# Patient Record
Sex: Female | Born: 1935
Health system: Southern US, Community
[De-identification: ages and names within clinical notes are randomized; demographics above are authoritative.]

## PROBLEM LIST (undated history)

## (undated) DIAGNOSIS — M1710 Unilateral primary osteoarthritis, unspecified knee: Secondary | ICD-10-CM

## (undated) DIAGNOSIS — K5790 Diverticulosis of intestine, part unspecified, without perforation or abscess without bleeding: Secondary | ICD-10-CM

## (undated) DIAGNOSIS — R079 Chest pain, unspecified: Secondary | ICD-10-CM

## (undated) DIAGNOSIS — K582 Mixed irritable bowel syndrome: Secondary | ICD-10-CM

## (undated) DIAGNOSIS — K219 Gastro-esophageal reflux disease without esophagitis: Secondary | ICD-10-CM

## (undated) DIAGNOSIS — M1990 Unspecified osteoarthritis, unspecified site: Secondary | ICD-10-CM

## (undated) DIAGNOSIS — E785 Hyperlipidemia, unspecified: Secondary | ICD-10-CM

## (undated) DIAGNOSIS — K5730 Diverticulosis of large intestine without perforation or abscess without bleeding: Secondary | ICD-10-CM

## (undated) DIAGNOSIS — I10 Essential (primary) hypertension: Secondary | ICD-10-CM

## (undated) DIAGNOSIS — Z8489 Family history of other specified conditions: Secondary | ICD-10-CM

## (undated) DIAGNOSIS — K589 Irritable bowel syndrome without diarrhea: Secondary | ICD-10-CM

## (undated) DIAGNOSIS — Z8719 Personal history of other diseases of the digestive system: Secondary | ICD-10-CM

## (undated) DIAGNOSIS — IMO0002 Reserved for concepts with insufficient information to code with codable children: Secondary | ICD-10-CM

## (undated) DIAGNOSIS — M545 Low back pain, unspecified: Secondary | ICD-10-CM

## (undated) DIAGNOSIS — I2510 Atherosclerotic heart disease of native coronary artery without angina pectoris: Secondary | ICD-10-CM

## (undated) DIAGNOSIS — R9389 Abnormal findings on diagnostic imaging of other specified body structures: Secondary | ICD-10-CM

## (undated) DIAGNOSIS — M8580 Other specified disorders of bone density and structure, unspecified site: Secondary | ICD-10-CM

## (undated) DIAGNOSIS — I25118 Atherosclerotic heart disease of native coronary artery with other forms of angina pectoris: Secondary | ICD-10-CM

## (undated) DIAGNOSIS — S0300XA Dislocation of jaw, unspecified side, initial encounter: Secondary | ICD-10-CM

## (undated) DIAGNOSIS — Z9861 Coronary angioplasty status: Secondary | ICD-10-CM

## (undated) DIAGNOSIS — G8929 Other chronic pain: Secondary | ICD-10-CM

## (undated) DIAGNOSIS — Z79899 Other long term (current) drug therapy: Secondary | ICD-10-CM

## (undated) DIAGNOSIS — J189 Pneumonia, unspecified organism: Secondary | ICD-10-CM

## (undated) DIAGNOSIS — R609 Edema, unspecified: Secondary | ICD-10-CM

## (undated) DIAGNOSIS — T4145XA Adverse effect of unspecified anesthetic, initial encounter: Secondary | ICD-10-CM

## (undated) HISTORY — DX: Diverticulosis of large intestine without perforation or abscess without bleeding: K57.30

## (undated) HISTORY — DX: Essential (primary) hypertension: I10

## (undated) HISTORY — PX: JOINT REPLACEMENT: SHX530

## (undated) HISTORY — DX: Chest pain, unspecified: R07.9

## (undated) HISTORY — DX: Other long term (current) drug therapy: Z79.899

## (undated) HISTORY — DX: Personal history of other diseases of the digestive system: Z87.19

## (undated) HISTORY — DX: Unilateral primary osteoarthritis, unspecified knee: M17.10

## (undated) HISTORY — DX: Edema, unspecified: R60.9

## (undated) HISTORY — PX: SQUAMOUS CELL CARCINOMA EXCISION: SHX2433

## (undated) HISTORY — PX: OTHER SURGICAL HISTORY: SHX169

## (undated) HISTORY — DX: Irritable bowel syndrome without diarrhea: K58.9

## (undated) HISTORY — PX: BROW LIFT AND BLEPHAROPLASTY: SHX1271

## (undated) HISTORY — DX: Mixed irritable bowel syndrome: K58.2

## (undated) HISTORY — DX: Hyperlipidemia, unspecified: E78.5

## (undated) HISTORY — DX: Atherosclerotic heart disease of native coronary artery with other forms of angina pectoris: I25.118

## (undated) HISTORY — DX: Abnormal findings on diagnostic imaging of other specified body structures: R93.89

---
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
## 1966-07-29 HISTORY — PX: NASAL SINUS SURGERY: SHX719

## 1987-07-30 DIAGNOSIS — T8859XA Other complications of anesthesia, initial encounter: Secondary | ICD-10-CM

## 1987-07-30 HISTORY — DX: Other complications of anesthesia, initial encounter: T88.59XA

## 1987-07-30 HISTORY — PX: ABDOMINAL HYSTERECTOMY: SHX81

## 1994-07-29 HISTORY — PX: KNEE ARTHROSCOPY: SHX127

## 1997-07-29 HISTORY — PX: CHOLECYSTECTOMY: SHX55

## 2011-07-30 HISTORY — PX: COLONOSCOPY: SHX174

## 2013-09-14 ENCOUNTER — Other Ambulatory Visit: Payer: Self-pay | Admitting: Orthopedic Surgery

## 2013-09-14 NOTE — H&P (Signed)
Pamela Dominguez. [name]  DOB: [birth year] Married / Language: English / Race: White Female  Date of Admission:  09-27-2013  Chief Complaint:  Right Knee Pain  History of Present Illness The patient is a 80 year old female who comes in for a preoperative History and Physical. The patient is scheduled for a right total knee arthroplasty to be performed by Dr. Dione Plover. Aluisio, MD at [facility] on 09-27-2013. The patient is a 80 year old female who presents for follow up of their knee. The patient is being followed for their bilateral knee pain and osteoarthritis.  Symptoms reported today include: pain and stiffness. The patient feels that they are doing poorly and report their pain level to be moderate. The following medication has been used for pain control: none (she had to stop the Aleve due to a diverticulitis flare).She was not able to do the therapy that was ordered, due to a surgery. She stated after she had recovered enough from her surgery to begin therapy, we did not have any appointments available. Her right knee is getting progressively worse. She doesn't feel as though it is going to improve. It is limiting what she can and cannot do. She has had injections in the past and not beneficial. She is at a stage now where she is just ready to go ahead and get the knee fixed. They have been treated conservatively in the past for the above stated problem and despite conservative measures, they continue to have progressive pain and severe functional limitations and dysfunction. They have failed non-operative management including home exercise, medications, and injections. It is felt that they would benefit from undergoing total joint replacement. Risks and benefits of the procedure have been discussed with the patient and they elect to proceed with surgery. There are no active contraindications to surgery such as ongoing infection or rapidly progressive neurological  disease.  Allergies Axid *ULCER DRUGS*. Hives, Itching. Penicillins. Hives, Itching. Quinite *Antimalarials**. Hives, Itching.  Problem List/Past Medical Primary osteoarthritis of both knees (715.16) Perimenopausal atrophic vaginitis (627.3) Diverticulitis Of Colon High blood pressure Irritable bowel syndrome Urinary Tract Infection Degenerative Disc Disease Menopause Gastroesophageal Reflux Disease Hyperlipidemia Insomnia Osteopenia   Family History Congestive Heart Failure. mother Hypertension. mother Cerebrovascular Accident. First Degree Relatives. mother Cancer. father   Social History Marital status. married Exercise. Exercises daily; does running / walking and other Current work status. 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 no Drug/Alcohol Rehab (Currently). no Alcohol use. never consumed alcohol Tobacco use. Never smoker. never smoker Children. 2 Drug/Alcohol Rehab (Previously). no Pain Contract. no Tobacco / smoke exposure. no    Medication History Aspirin EC Low Strength (81MG  Tablet DR, Oral) Active. Multivitamins ( Oral) Active. Fish Oil (1200MG  Capsule, Oral) Active. Glucosamine Chondr 1500 Complx ( Oral) Active. PreserVision AREDS ( Oral) Active. Calcium 600 ( Oral) Specific dose unknown - Active. Vitamin B Complex ( Oral) Active. Potassium Chloride CR (20MEQ Tablet ER, Oral) Active. Hydrochlorothiazide (25MG  Tablet, Oral) Active. Cholestyramine (4GM/DOSE Powder, Oral) Active. Omeprazole (20MG  Capsule DR, Oral) Active. Amlodipine Besy-Benazepril HCl (10-40MG  Capsule, Oral) Active.   Past Surgical History Hysterectomy. Date: 33. complete (non-cancerous) Appendectomy. Date: 30. Arthroscopy of Knee. Date: 63. left Tonsillectomy. Date: 28. Sinus Surgery. Date: 21. Straighten Nasal Septum Gallbladder Surgery. Date: 1999. laporoscopic   Review of Systems General:Not  Present- Chills, Fever, Night Sweats, Fatigue, Weight Gain, Weight Loss and Memory Loss. Skin:Not Present- Hives, Itching, Rash, Eczema and Lesions. HEENT:Not Present- Tinnitus, Headache, Double Vision,  Visual Loss, Hearing Loss and Dentures. Respiratory:Not Present- Shortness of breath with exertion, Shortness of breath at rest, Allergies, Coughing up blood and Chronic Cough. Cardiovascular:Not Present- Chest Pain, Racing/skipping heartbeats, Difficulty Breathing Lying Down, Murmur, Swelling and Palpitations. Gastrointestinal:Not Present- Bloody Stool, Heartburn, Abdominal Pain, Vomiting, Nausea, Constipation, Diarrhea, Difficulty Swallowing, Jaundice and Loss of appetitie. Female Genitourinary:Not Present- Blood in Urine, Urinary frequency, Weak urinary stream, Discharge, Flank Pain, Incontinence, Painful Urination, Urgency, Urinary Retention and Urinating at Night. Musculoskeletal:Present- Joint Swelling and Morning Stiffness. Not Present- Muscle Weakness, Muscle Pain, Joint Pain, Back Pain and Spasms. Neurological:Not Present- Tremor, Dizziness, Blackout spells, Paralysis, Difficulty with balance and Weakness. Psychiatric:Not Present- Insomnia.    Vitals Weight: 139 lb Height: 63 in Weight was reported by patient. Height was reported by patient. Body Surface Area: 1.67 m Body Mass Index: 24.62 kg/m Pulse: 68 (Regular) Resp.: 12 (Unlabored) BP: 120/68 (Sitting, Right Arm, Standard)     Physical Exam The physical exam findings are as follows:   General Mental Status - Alert, cooperative and good historian. General Appearance- pleasant. Not in acute distress. Orientation- Oriented X3. Build & Nutrition- Well nourished and Well developed.   Head and Neck Head- normocephalic, atraumatic . Neck Global Assessment- supple. no bruit auscultated on the right and no bruit auscultated on the left.   Eye Vision- Wears corrective lenses. Pupil-  Bilateral- Regular and Round. Motion- Bilateral- EOMI.   Chest and Lung Exam Auscultation: Breath sounds:- clear at anterior chest wall and - clear at posterior chest wall. Adventitious sounds:- No Adventitious sounds.   Cardiovascular Auscultation:Rhythm- Regular rate and rhythm. Heart Sounds- S1 WNL and S2 WNL. Murmurs & Other Heart Sounds:Auscultation of the heart reveals - No Murmurs.   Abdomen Palpation/Percussion:Tenderness- Abdomen is non-tender to palpation. Rigidity (guarding)- Abdomen is soft. Auscultation:Auscultation of the abdomen reveals - Bowel sounds normal.   Female Genitourinary Not done, not pertinent to present illness  Musculoskeletal Her right knee shows no effusion. There is a varus deformity noted. Her range of motion is about 5 to 125 with marked crepitus on range of motion, tenderness medial greater than lateral with no instability.  RADIOGRAPHS: Reviewed her radiographs. Once again she has bone on bone arthritis, medial and patellofemoral.   Assessment & Plan Primary osteoarthritis of both knees (715.16) Impression: Right Knee  Note: Plan is for a Right Total Knee Replacement by Dr. Aluisio.  Plan is to go SNF - [facility] in [place]  PCP - Dr. Greg Grisso - Pending clearance at the time of office H&P. She had an abnormal EKG in Dr. Grisso's office on her preop evaluation and has a nuclear stress test pending at the time of her H&P.  The patient does not have any contraindications and will receive TXA (tranexamic acid) prior to surgery.  Signed electronically by Melton Walls L Saraia Platner, III PA-C  

## 2013-09-15 ENCOUNTER — Encounter (HOSPITAL_COMMUNITY): Payer: Self-pay | Admitting: Pharmacy Technician

## 2013-09-16 ENCOUNTER — Other Ambulatory Visit: Payer: Self-pay | Admitting: Orthopedic Surgery

## 2013-09-21 ENCOUNTER — Inpatient Hospital Stay (HOSPITAL_COMMUNITY): Admission: RE | Admit: 2013-09-21 | Payer: Self-pay | Source: Ambulatory Visit

## 2013-09-21 ENCOUNTER — Other Ambulatory Visit (HOSPITAL_COMMUNITY): Payer: Self-pay | Admitting: Orthopedic Surgery

## 2013-09-21 ENCOUNTER — Other Ambulatory Visit (HOSPITAL_COMMUNITY): Payer: Self-pay | Admitting: Anesthesiology

## 2013-09-21 NOTE — Progress Notes (Signed)
Stress test 09-13-13 dr Otho Perl on chart ekg dr Bea Graff 09-06-13 on chart Medical clearance note dr Bea Graff 09-15-13 on chart

## 2013-09-22 ENCOUNTER — Encounter (HOSPITAL_COMMUNITY)
Admission: RE | Admit: 2013-09-22 | Discharge: 2013-09-22 | Disposition: A | Discharge status: Home / Self Care | Payer: Medicare Other | Source: Ambulatory Visit | Attending: Orthopedic Surgery | Admitting: Orthopedic Surgery

## 2013-09-22 ENCOUNTER — Encounter (HOSPITAL_COMMUNITY): Payer: Self-pay

## 2013-09-22 ENCOUNTER — Ambulatory Visit (HOSPITAL_COMMUNITY)
Admission: RE | Admit: 2013-09-22 | Discharge: 2013-09-22 | Disposition: A | Discharge status: Home / Self Care | Payer: Medicare Other | Source: Ambulatory Visit | Attending: Orthopedic Surgery | Admitting: Orthopedic Surgery

## 2013-09-22 DIAGNOSIS — Z9089 Acquired absence of other organs: Secondary | ICD-10-CM | POA: Insufficient documentation

## 2013-09-22 DIAGNOSIS — Z01812 Encounter for preprocedural laboratory examination: Secondary | ICD-10-CM | POA: Insufficient documentation

## 2013-09-22 DIAGNOSIS — Z01818 Encounter for other preprocedural examination: Secondary | ICD-10-CM | POA: Insufficient documentation

## 2013-09-22 HISTORY — DX: Essential (primary) hypertension: I10

## 2013-09-22 HISTORY — DX: Other specified disorders of bone density and structure, unspecified site: M85.80

## 2013-09-22 HISTORY — DX: Unspecified osteoarthritis, unspecified site: M19.90

## 2013-09-22 HISTORY — DX: Adverse effect of unspecified anesthetic, initial encounter: T41.45XA

## 2013-09-22 HISTORY — DX: Irritable bowel syndrome, unspecified: K58.9

## 2013-09-22 HISTORY — DX: Dislocation of jaw, unspecified side, initial encounter: S03.00XA

## 2013-09-22 HISTORY — DX: Gastro-esophageal reflux disease without esophagitis: K21.9

## 2013-09-22 HISTORY — DX: Low back pain, unspecified: M54.50

## 2013-09-22 HISTORY — DX: Diverticulosis of intestine, part unspecified, without perforation or abscess without bleeding: K57.90

## 2013-09-22 HISTORY — DX: Hyperlipidemia, unspecified: E78.5

## 2013-09-22 HISTORY — DX: Family history of other specified conditions: Z84.89

## 2013-09-22 HISTORY — DX: Other chronic pain: G89.29

## 2013-09-22 HISTORY — DX: Low back pain: M54.5

## 2013-09-22 LAB — CBC
HCT: 40.9 % (ref 36.0–46.0)
Hemoglobin: 13.8 g/dL (ref 12.0–15.0)
MCH: 30 pg (ref 26.0–34.0)
MCHC: 33.7 g/dL (ref 30.0–36.0)
MCV: 88.9 fL (ref 78.0–100.0)
PLATELETS: 196 10*3/uL (ref 150–400)
RBC: 4.6 MIL/uL (ref 3.87–5.11)
RDW: 13 % (ref 11.5–15.5)
WBC: 6.4 10*3/uL (ref 4.0–10.5)

## 2013-09-22 LAB — COMPREHENSIVE METABOLIC PANEL
ALBUMIN: 3.6 g/dL (ref 3.5–5.2)
ALT: 16 U/L (ref 0–35)
AST: 14 U/L (ref 0–37)
Alkaline Phosphatase: 85 U/L (ref 39–117)
BUN: 14 mg/dL (ref 6–23)
CALCIUM: 9.6 mg/dL (ref 8.4–10.5)
CHLORIDE: 100 meq/L (ref 96–112)
CO2: 27 mEq/L (ref 19–32)
CREATININE: 0.43 mg/dL — AB (ref 0.50–1.10)
GFR calc Af Amer: >90 mL/min (ref >90)
Glucose, Bld: 86 mg/dL (ref 70–99)
Potassium: 4 mEq/L (ref 3.7–5.3)
Sodium: 138 mEq/L (ref 137–147)
Total Bilirubin: 0.3 mg/dL (ref 0.3–1.2)
Total Protein: 6.6 g/dL (ref 6.0–8.3)

## 2013-09-22 LAB — SURGICAL PCR SCREEN
MRSA, PCR: NEGATIVE
Staphylococcus aureus: NEGATIVE

## 2013-09-22 LAB — URINALYSIS, ROUTINE W REFLEX MICROSCOPIC
Bilirubin Urine: NEGATIVE
GLUCOSE, UA: NEGATIVE mg/dL
Hgb urine dipstick: NEGATIVE
KETONES UR: NEGATIVE mg/dL
NITRITE: NEGATIVE
Protein, ur: NEGATIVE mg/dL
Specific Gravity, Urine: 1.007 (ref 1.005–1.030)
UROBILINOGEN UA: 0.2 mg/dL (ref 0.0–1.0)
pH: 6 (ref 5.0–8.0)

## 2013-09-22 LAB — APTT: aPTT: 27 seconds (ref 24–37)

## 2013-09-22 LAB — PROTIME-INR
INR: 0.94 (ref 0.00–1.49)
Prothrombin Time: 12.4 seconds (ref 11.6–15.2)

## 2013-09-22 LAB — URINE MICROSCOPIC-ADD ON

## 2013-09-22 LAB — ABO/RH: ABO/RH(D): O NEG

## 2013-09-22 NOTE — Progress Notes (Signed)
Micro ua results faxed to dr aluisio by epic 

## 2013-09-22 NOTE — Patient Instructions (Addendum)
Bruni  09/22/2013   Your procedure is scheduled on: Monday March 2nd.  Report to [place] at 830 AM.  Call this number if you have problems the morning of surgery [phone]   Remember:  Do not eat food or drink liquids :After Midnight.     Take these medicines the morning of surgery with A SIP OF WATER: omeprazole, zyrtec                                SEE [place] PREPARING FOR SURGERY SHEET             You may not have any metal on your body including hair pins and piercings  Do not wear jewelry, make-up.  Do not wear lotions, powders, or perfumes. No  Deodorant is to be worn.   Men may shave face and neck.  Do not bring valuables to the hospital. [place].  Contacts, dentures or bridgework may not be worn into surgery.  Leave suitcase in the car. After surgery it may be brought to your room.  For patients admitted to the hospital, checkout time is 11:00 AM the day of discharge.   Please read over the following fact sheets that you were given: [facility] Preparing for surgery sheet, MRSA information, incentive spirometer sheet, blood fact sheet  Call Zelphia Cairo RN pre op nurse if needed [phone]    [place].  PATIENT SIGNATURE___________________________________________  NURSE SIGNATURE_____________________________________________

## 2013-09-27 ENCOUNTER — Encounter (HOSPITAL_COMMUNITY)
Admission: RE | Disposition: A | Discharge status: Skilled Nursing Facility | Payer: Self-pay | Source: Ambulatory Visit | Attending: Orthopedic Surgery

## 2013-09-27 ENCOUNTER — Inpatient Hospital Stay (HOSPITAL_COMMUNITY)
Admission: RE | Admit: 2013-09-27 | Discharge: 2013-09-29 | DRG: 470 | Disposition: A | Discharge status: Skilled Nursing Facility | Payer: Medicare Other | Source: Ambulatory Visit | Attending: Orthopedic Surgery | Admitting: Orthopedic Surgery

## 2013-09-27 ENCOUNTER — Encounter (HOSPITAL_COMMUNITY): Payer: Medicare Other | Admitting: Anesthesiology

## 2013-09-27 ENCOUNTER — Encounter (HOSPITAL_COMMUNITY): Payer: Self-pay | Admitting: *Deleted

## 2013-09-27 ENCOUNTER — Inpatient Hospital Stay (HOSPITAL_COMMUNITY): Payer: Medicare Other | Admitting: Anesthesiology

## 2013-09-27 DIAGNOSIS — M179 Osteoarthritis of knee, unspecified: Secondary | ICD-10-CM

## 2013-09-27 DIAGNOSIS — M899 Disorder of bone, unspecified: Secondary | ICD-10-CM | POA: Diagnosis present

## 2013-09-27 DIAGNOSIS — Z79899 Other long term (current) drug therapy: Secondary | ICD-10-CM

## 2013-09-27 DIAGNOSIS — E785 Hyperlipidemia, unspecified: Secondary | ICD-10-CM

## 2013-09-27 DIAGNOSIS — Z8249 Family history of ischemic heart disease and other diseases of the circulatory system: Secondary | ICD-10-CM

## 2013-09-27 DIAGNOSIS — K219 Gastro-esophageal reflux disease without esophagitis: Secondary | ICD-10-CM

## 2013-09-27 DIAGNOSIS — M1710 Unilateral primary osteoarthritis, unspecified knee: Principal | ICD-10-CM | POA: Diagnosis present

## 2013-09-27 DIAGNOSIS — M545 Low back pain, unspecified: Secondary | ICD-10-CM | POA: Diagnosis present

## 2013-09-27 DIAGNOSIS — K589 Irritable bowel syndrome without diarrhea: Secondary | ICD-10-CM

## 2013-09-27 DIAGNOSIS — Z887 Allergy status to serum and vaccine status: Secondary | ICD-10-CM

## 2013-09-27 DIAGNOSIS — G8929 Other chronic pain: Secondary | ICD-10-CM | POA: Diagnosis present

## 2013-09-27 DIAGNOSIS — Z880 Allergy status to penicillin: Secondary | ICD-10-CM

## 2013-09-27 DIAGNOSIS — Z7982 Long term (current) use of aspirin: Secondary | ICD-10-CM

## 2013-09-27 DIAGNOSIS — Z96651 Presence of right artificial knee joint: Secondary | ICD-10-CM

## 2013-09-27 DIAGNOSIS — I10 Essential (primary) hypertension: Secondary | ICD-10-CM

## 2013-09-27 DIAGNOSIS — M898X9 Other specified disorders of bone, unspecified site: Secondary | ICD-10-CM | POA: Diagnosis present

## 2013-09-27 DIAGNOSIS — M949 Disorder of cartilage, unspecified: Secondary | ICD-10-CM

## 2013-09-27 DIAGNOSIS — Z888 Allergy status to other drugs, medicaments and biological substances status: Secondary | ICD-10-CM

## 2013-09-27 DIAGNOSIS — K5730 Diverticulosis of large intestine without perforation or abscess without bleeding: Secondary | ICD-10-CM

## 2013-09-27 HISTORY — DX: Unilateral primary osteoarthritis, unspecified knee: M17.10

## 2013-09-27 HISTORY — DX: Osteoarthritis of knee, unspecified: M17.9

## 2013-09-27 HISTORY — PX: TOTAL KNEE ARTHROPLASTY: SHX125

## 2013-09-27 LAB — TYPE AND SCREEN
ABO/RH(D): O NEG
ANTIBODY SCREEN: NEGATIVE

## 2013-09-27 SURGERY — ARTHROPLASTY, KNEE, TOTAL
Anesthesia: Spinal | Site: Knee | Laterality: Right

## 2013-09-27 MED ORDER — DEXAMETHASONE SODIUM PHOSPHATE 10 MG/ML IJ SOLN: 10.0000 mg | Freq: Once | INTRAMUSCULAR | Status: DC

## 2013-09-27 MED ORDER — METHOCARBAMOL 100 MG/ML IJ SOLN
500.0000 mg | Freq: Four times a day (QID) | INTRAVENOUS | Status: DC | PRN
  Administered 2013-09-27: 500 mg via INTRAVENOUS
  Filled 2013-09-27: qty 5

## 2013-09-27 MED ORDER — CHOLESTYRAMINE 4 G PO PACK
4.0000 g | PACK | Freq: Every day | ORAL | Status: DC
  Filled 2013-09-27 (×3): qty 1

## 2013-09-27 MED ORDER — PROMETHAZINE HCL 25 MG/ML IJ SOLN: 6.2500 mg | INTRAMUSCULAR | Status: DC | PRN

## 2013-09-27 MED ORDER — LACTATED RINGERS IV SOLN: INTRAVENOUS | Status: DC

## 2013-09-27 MED ORDER — FLEET ENEMA 7-19 GM/118ML RE ENEM: 1.0000 | ENEMA | Freq: Once | RECTAL | Status: AC | PRN

## 2013-09-27 MED ORDER — HYDROCHLOROTHIAZIDE 25 MG PO TABS
25.0000 mg | ORAL_TABLET | Freq: Every morning | ORAL | Status: DC
  Administered 2013-09-28 – 2013-09-29 (×2): 25 mg via ORAL
  Filled 2013-09-27 (×2): qty 1

## 2013-09-27 MED ORDER — MENTHOL 3 MG MT LOZG
1.0000 | LOZENGE | OROMUCOSAL | Status: DC | PRN
  Filled 2013-09-27: qty 9

## 2013-09-27 MED ORDER — PHENYLEPHRINE HCL 10 MG/ML IJ SOLN
INTRAMUSCULAR | Status: DC | PRN
  Administered 2013-09-27 (×4): 40 ug via INTRAVENOUS

## 2013-09-27 MED ORDER — OXYCODONE HCL 5 MG PO TABS
5.0000 mg | ORAL_TABLET | ORAL | Status: DC | PRN
  Administered 2013-09-27 – 2013-09-28 (×3): 10 mg via ORAL
  Administered 2013-09-28 (×2): 5 mg via ORAL
  Administered 2013-09-28: 10 mg via ORAL
  Administered 2013-09-28: 5 mg via ORAL
  Administered 2013-09-29 (×4): 10 mg via ORAL
  Filled 2013-09-27 (×6): qty 2
  Filled 2013-09-27 (×3): qty 1
  Filled 2013-09-27 (×2): qty 2

## 2013-09-27 MED ORDER — BUPIVACAINE LIPOSOME 1.3 % IJ SUSP
INTRAMUSCULAR | Status: DC | PRN
  Administered 2013-09-27: 20 mL

## 2013-09-27 MED ORDER — MIDAZOLAM HCL 2 MG/2ML IJ SOLN
INTRAMUSCULAR | Status: AC
  Filled 2013-09-27: qty 2

## 2013-09-27 MED ORDER — ONDANSETRON HCL 4 MG/2ML IJ SOLN: 4.0000 mg | Freq: Four times a day (QID) | INTRAMUSCULAR | Status: DC | PRN

## 2013-09-27 MED ORDER — MORPHINE SULFATE 2 MG/ML IJ SOLN
1.0000 mg | INTRAMUSCULAR | Status: DC | PRN
  Administered 2013-09-28: 1 mg via INTRAVENOUS
  Filled 2013-09-27: qty 1

## 2013-09-27 MED ORDER — LORATADINE 10 MG PO TABS
10.0000 mg | ORAL_TABLET | Freq: Every day | ORAL | Status: DC
Start: 2013-09-28 — End: 2013-09-29
  Administered 2013-09-28 – 2013-09-29 (×2): 10 mg via ORAL
  Filled 2013-09-27 (×2): qty 1

## 2013-09-27 MED ORDER — METHOCARBAMOL 500 MG PO TABS
500.0000 mg | ORAL_TABLET | Freq: Four times a day (QID) | ORAL | Status: DC | PRN
  Administered 2013-09-27 – 2013-09-29 (×4): 500 mg via ORAL
  Filled 2013-09-27 (×4): qty 1

## 2013-09-27 MED ORDER — KCL IN DEXTROSE-NACL 20-5-0.9 MEQ/L-%-% IV SOLN
INTRAVENOUS | Status: AC
  Filled 2013-09-27: qty 1000

## 2013-09-27 MED ORDER — METOCLOPRAMIDE HCL 5 MG/ML IJ SOLN: 5.0000 mg | Freq: Three times a day (TID) | INTRAMUSCULAR | Status: DC | PRN

## 2013-09-27 MED ORDER — ACETAMINOPHEN 500 MG PO TABS
1000.0000 mg | ORAL_TABLET | Freq: Four times a day (QID) | ORAL | Status: AC
  Administered 2013-09-27 – 2013-09-28 (×4): 1000 mg via ORAL
  Filled 2013-09-27 (×4): qty 2

## 2013-09-27 MED ORDER — SODIUM CHLORIDE 0.9 % IR SOLN
Status: DC | PRN
  Administered 2013-09-27: 1000 mL

## 2013-09-27 MED ORDER — POTASSIUM CHLORIDE CRYS ER 20 MEQ PO TBCR
20.0000 meq | EXTENDED_RELEASE_TABLET | Freq: Every day | ORAL | Status: DC
  Administered 2013-09-27 – 2013-09-29 (×3): 20 meq via ORAL
  Filled 2013-09-27 (×3): qty 1

## 2013-09-27 MED ORDER — TRAMADOL HCL 50 MG PO TABS: 50.0000 mg | ORAL_TABLET | Freq: Four times a day (QID) | ORAL | Status: DC | PRN

## 2013-09-27 MED ORDER — KETOROLAC TROMETHAMINE 15 MG/ML IJ SOLN
7.5000 mg | Freq: Four times a day (QID) | INTRAMUSCULAR | Status: AC | PRN
  Filled 2013-09-27: qty 1

## 2013-09-27 MED ORDER — METOCLOPRAMIDE HCL 10 MG PO TABS: 5.0000 mg | ORAL_TABLET | Freq: Three times a day (TID) | ORAL | Status: DC | PRN

## 2013-09-27 MED ORDER — BISACODYL 10 MG RE SUPP
10.0000 mg | Freq: Every day | RECTAL | Status: DC | PRN
  Administered 2013-09-29: 10 mg via RECTAL
  Filled 2013-09-27: qty 1

## 2013-09-27 MED ORDER — PROPOFOL 10 MG/ML IV BOLUS
INTRAVENOUS | Status: AC
  Filled 2013-09-27: qty 20

## 2013-09-27 MED ORDER — PANTOPRAZOLE SODIUM 40 MG PO TBEC
40.0000 mg | DELAYED_RELEASE_TABLET | Freq: Every day | ORAL | Status: DC
Start: 2013-09-28 — End: 2013-09-28
  Administered 2013-09-28: 40 mg via ORAL
  Filled 2013-09-27 (×2): qty 1

## 2013-09-27 MED ORDER — BUPIVACAINE LIPOSOME 1.3 % IJ SUSP
20.0000 mL | Freq: Once | INTRAMUSCULAR | Status: DC
  Filled 2013-09-27: qty 20

## 2013-09-27 MED ORDER — ACETAMINOPHEN 325 MG PO TABS: 650.0000 mg | ORAL_TABLET | Freq: Four times a day (QID) | ORAL | Status: DC | PRN

## 2013-09-27 MED ORDER — VANCOMYCIN HCL IN DEXTROSE 1-5 GM/200ML-% IV SOLN
INTRAVENOUS | Status: AC
  Filled 2013-09-27: qty 200

## 2013-09-27 MED ORDER — POLYETHYLENE GLYCOL 3350 17 G PO PACK: 17.0000 g | PACK | Freq: Every day | ORAL | Status: DC | PRN

## 2013-09-27 MED ORDER — 0.9 % SODIUM CHLORIDE (POUR BTL) OPTIME
TOPICAL | Status: DC | PRN
  Administered 2013-09-27: 1000 mL

## 2013-09-27 MED ORDER — RIVAROXABAN 10 MG PO TABS
10.0000 mg | ORAL_TABLET | Freq: Every day | ORAL | Status: DC
  Administered 2013-09-28 – 2013-09-29 (×2): 10 mg via ORAL
  Filled 2013-09-27 (×3): qty 1

## 2013-09-27 MED ORDER — AMLODIPINE BESYLATE 10 MG PO TABS
10.0000 mg | ORAL_TABLET | Freq: Once | ORAL | Status: AC
  Administered 2013-09-27: 10 mg via ORAL
  Filled 2013-09-27: qty 1

## 2013-09-27 MED ORDER — BUPIVACAINE HCL (PF) 0.25 % IJ SOLN
INTRAMUSCULAR | Status: AC
  Filled 2013-09-27: qty 30

## 2013-09-27 MED ORDER — KCL IN DEXTROSE-NACL 20-5-0.9 MEQ/L-%-% IV SOLN
INTRAVENOUS | Status: DC
  Administered 2013-09-27: 15:00:00 via INTRAVENOUS
  Filled 2013-09-27 (×4): qty 1000

## 2013-09-27 MED ORDER — FENTANYL CITRATE 0.05 MG/ML IJ SOLN
INTRAMUSCULAR | Status: DC | PRN
  Administered 2013-09-27: 50 ug via INTRAVENOUS

## 2013-09-27 MED ORDER — SODIUM CHLORIDE 0.9 % IV SOLN: INTRAVENOUS | Status: DC

## 2013-09-27 MED ORDER — PROPOFOL 10 MG/ML IV BOLUS
INTRAVENOUS | Status: AC
Start: 2013-09-27 — End: 2013-09-27
  Filled 2013-09-27: qty 20

## 2013-09-27 MED ORDER — MIDAZOLAM HCL 5 MG/5ML IJ SOLN
INTRAMUSCULAR | Status: DC | PRN
  Administered 2013-09-27: 2 mg via INTRAVENOUS

## 2013-09-27 MED ORDER — DOCUSATE SODIUM 100 MG PO CAPS
100.0000 mg | ORAL_CAPSULE | Freq: Two times a day (BID) | ORAL | Status: DC
  Administered 2013-09-27 – 2013-09-29 (×4): 100 mg via ORAL

## 2013-09-27 MED ORDER — SODIUM CHLORIDE 0.9 % IJ SOLN
INTRAMUSCULAR | Status: AC
  Filled 2013-09-27: qty 50

## 2013-09-27 MED ORDER — HYDROMORPHONE HCL PF 1 MG/ML IJ SOLN: 0.2500 mg | INTRAMUSCULAR | Status: DC | PRN

## 2013-09-27 MED ORDER — ONDANSETRON HCL 4 MG PO TABS: 4.0000 mg | ORAL_TABLET | Freq: Four times a day (QID) | ORAL | Status: DC | PRN

## 2013-09-27 MED ORDER — LACTATED RINGERS IV SOLN
INTRAVENOUS | Status: DC | PRN
  Administered 2013-09-27 (×2): via INTRAVENOUS

## 2013-09-27 MED ORDER — FENTANYL CITRATE 0.05 MG/ML IJ SOLN
INTRAMUSCULAR | Status: AC
  Filled 2013-09-27: qty 2

## 2013-09-27 MED ORDER — ACETAMINOPHEN 650 MG RE SUPP: 650.0000 mg | Freq: Four times a day (QID) | RECTAL | Status: DC | PRN

## 2013-09-27 MED ORDER — PHENYLEPHRINE 40 MCG/ML (10ML) SYRINGE FOR IV PUSH (FOR BLOOD PRESSURE SUPPORT)
PREFILLED_SYRINGE | INTRAVENOUS | Status: AC
  Filled 2013-09-27: qty 10

## 2013-09-27 MED ORDER — ACETAMINOPHEN 10 MG/ML IV SOLN
INTRAVENOUS | Status: DC | PRN
  Administered 2013-09-27: 1000 mg via INTRAVENOUS

## 2013-09-27 MED ORDER — DEXAMETHASONE SODIUM PHOSPHATE 10 MG/ML IJ SOLN
INTRAMUSCULAR | Status: DC | PRN
  Administered 2013-09-27: 10 mg via INTRAVENOUS

## 2013-09-27 MED ORDER — DEXAMETHASONE SODIUM PHOSPHATE 10 MG/ML IJ SOLN
10.0000 mg | Freq: Every day | INTRAMUSCULAR | Status: AC
  Filled 2013-09-27: qty 1

## 2013-09-27 MED ORDER — TRANEXAMIC ACID 100 MG/ML IV SOLN
1000.0000 mg | INTRAVENOUS | Status: AC
  Administered 2013-09-27: 1000 mg via INTRAVENOUS
  Filled 2013-09-27: qty 10

## 2013-09-27 MED ORDER — LACTATED RINGERS IV SOLN
INTRAVENOUS | Status: DC
  Administered 2013-09-27: 1000 mL via INTRAVENOUS

## 2013-09-27 MED ORDER — BUPIVACAINE HCL 0.25 % IJ SOLN
INTRAMUSCULAR | Status: DC | PRN
  Administered 2013-09-27: 20 mL

## 2013-09-27 MED ORDER — PROPOFOL 10 MG/ML IV BOLUS
INTRAVENOUS | Status: DC | PRN
  Administered 2013-09-27: 20 mg via INTRAVENOUS

## 2013-09-27 MED ORDER — PHENOL 1.4 % MT LIQD
1.0000 | OROMUCOSAL | Status: DC | PRN
  Filled 2013-09-27: qty 177

## 2013-09-27 MED ORDER — DIPHENHYDRAMINE HCL 12.5 MG/5ML PO ELIX: 12.5000 mg | ORAL_SOLUTION | ORAL | Status: DC | PRN

## 2013-09-27 MED ORDER — KETAMINE HCL 50 MG/ML IJ SOLN
INTRAMUSCULAR | Status: DC | PRN
  Administered 2013-09-27: 1.7 mg via INTRAMUSCULAR

## 2013-09-27 MED ORDER — STERILE WATER FOR IRRIGATION IR SOLN
Status: DC | PRN
  Administered 2013-09-27: 3000 mL

## 2013-09-27 MED ORDER — VANCOMYCIN HCL IN DEXTROSE 1-5 GM/200ML-% IV SOLN
1000.0000 mg | INTRAVENOUS | Status: AC
  Administered 2013-09-27: 1000 mg via INTRAVENOUS

## 2013-09-27 MED ORDER — VANCOMYCIN HCL IN DEXTROSE 1-5 GM/200ML-% IV SOLN
1000.0000 mg | Freq: Two times a day (BID) | INTRAVENOUS | Status: AC
  Administered 2013-09-27: 1000 mg via INTRAVENOUS
  Filled 2013-09-27: qty 200

## 2013-09-27 MED ORDER — SODIUM CHLORIDE 0.9 % IJ SOLN
INTRAMUSCULAR | Status: DC | PRN
  Administered 2013-09-27: 30 mL via INTRAVENOUS

## 2013-09-27 MED ORDER — ACETAMINOPHEN 10 MG/ML IV SOLN
1000.0000 mg | Freq: Once | INTRAVENOUS | Status: DC
  Filled 2013-09-27: qty 100

## 2013-09-27 MED ORDER — PROPOFOL INFUSION 10 MG/ML OPTIME
INTRAVENOUS | Status: DC | PRN
  Administered 2013-09-27: 50 ug/kg/min via INTRAVENOUS

## 2013-09-27 MED ORDER — DEXAMETHASONE 6 MG PO TABS
10.0000 mg | ORAL_TABLET | Freq: Every day | ORAL | Status: AC
  Administered 2013-09-28: 10 mg via ORAL
  Filled 2013-09-27: qty 1

## 2013-09-27 SURGICAL SUPPLY — 54 items
BAG ZIPLOCK 12X15 (MISCELLANEOUS) ×3 IMPLANT
BANDAGE ELASTIC 6 VELCRO ST LF (GAUZE/BANDAGES/DRESSINGS) ×3 IMPLANT
BANDAGE ESMARK 6X9 LF (GAUZE/BANDAGES/DRESSINGS) ×1 IMPLANT
BLADE SAG 18X100X1.27 (BLADE) ×3 IMPLANT
BLADE SAW SGTL 11.0X1.19X90.0M (BLADE) ×3 IMPLANT
BNDG ESMARK 6X9 LF (GAUZE/BANDAGES/DRESSINGS) ×3
BOWL SMART MIX CTS (DISPOSABLE) ×3 IMPLANT
CAPT RP KNEE ×3 IMPLANT
CEMENT HV SMART SET (Cement) ×6 IMPLANT
CLOSURE WOUND 1/2 X4 (GAUZE/BANDAGES/DRESSINGS) ×2
CUFF TOURN SGL QUICK 34 (TOURNIQUET CUFF) ×2
CUFF TRNQT CYL 34X4X40X1 (TOURNIQUET CUFF) ×1 IMPLANT
DECANTER SPIKE VIAL GLASS SM (MISCELLANEOUS) ×3 IMPLANT
DRAPE EXTREMITY T 121X128X90 (DRAPE) ×3 IMPLANT
DRAPE POUCH INSTRU U-SHP 10X18 (DRAPES) ×3 IMPLANT
DRAPE U-SHAPE 47X51 STRL (DRAPES) ×3 IMPLANT
DRSG ADAPTIC 3X8 NADH LF (GAUZE/BANDAGES/DRESSINGS) ×3 IMPLANT
DURAPREP 26ML APPLICATOR (WOUND CARE) ×3 IMPLANT
ELECT REM PT RETURN 9FT ADLT (ELECTROSURGICAL) ×3
ELECTRODE REM PT RTRN 9FT ADLT (ELECTROSURGICAL) ×1 IMPLANT
EVACUATOR 1/8 PVC DRAIN (DRAIN) ×3 IMPLANT
FACESHIELD LNG OPTICON STERILE (SAFETY) ×15 IMPLANT
GLOVE BIO SURGEON STRL SZ7.5 (GLOVE) IMPLANT
GLOVE BIO SURGEON STRL SZ8 (GLOVE) ×3 IMPLANT
GLOVE BIOGEL PI IND STRL 8 (GLOVE) ×2 IMPLANT
GLOVE BIOGEL PI INDICATOR 8 (GLOVE) ×4
GLOVE SURG SS PI 6.5 STRL IVOR (GLOVE) IMPLANT
GOWN STRL REUS W/TWL LRG LVL3 (GOWN DISPOSABLE) ×3 IMPLANT
GOWN STRL REUS W/TWL XL LVL3 (GOWN DISPOSABLE) IMPLANT
HANDPIECE INTERPULSE COAX TIP (DISPOSABLE) ×2
IMMOBILIZER KNEE 20 (SOFTGOODS) ×3 IMPLANT
KIT BASIN OR (CUSTOM PROCEDURE TRAY) ×3 IMPLANT
MANIFOLD NEPTUNE II (INSTRUMENTS) ×3 IMPLANT
NDL SAFETY ECLIPSE 18X1.5 (NEEDLE) ×2 IMPLANT
NEEDLE HYPO 18GX1.5 SHARP (NEEDLE) ×4
NS IRRIG 1000ML POUR BTL (IV SOLUTION) ×3 IMPLANT
PACK TOTAL JOINT (CUSTOM PROCEDURE TRAY) ×3 IMPLANT
PAD ABD 8X10 STRL (GAUZE/BANDAGES/DRESSINGS) ×3 IMPLANT
PADDING CAST COTTON 6X4 STRL (CAST SUPPLIES) ×6 IMPLANT
POSITIONER SURGICAL ARM (MISCELLANEOUS) ×3 IMPLANT
SET HNDPC FAN SPRY TIP SCT (DISPOSABLE) ×1 IMPLANT
SPONGE GAUZE 4X4 12PLY (GAUZE/BANDAGES/DRESSINGS) ×3 IMPLANT
STRIP CLOSURE SKIN 1/2X4 (GAUZE/BANDAGES/DRESSINGS) ×4 IMPLANT
SUCTION FRAZIER 12FR DISP (SUCTIONS) ×3 IMPLANT
SUT MNCRL AB 4-0 PS2 18 (SUTURE) ×3 IMPLANT
SUT VIC AB 2-0 CT1 27 (SUTURE) ×6
SUT VIC AB 2-0 CT1 TAPERPNT 27 (SUTURE) ×3 IMPLANT
SUT VLOC 180 0 24IN GS25 (SUTURE) ×3 IMPLANT
SYR 20CC LL (SYRINGE) ×3 IMPLANT
SYR 50ML LL SCALE MARK (SYRINGE) ×3 IMPLANT
TOWEL OR 17X26 10 PK STRL BLUE (TOWEL DISPOSABLE) ×6 IMPLANT
TRAY FOLEY CATH 14FRSI W/METER (CATHETERS) ×3 IMPLANT
WATER STERILE IRR 1500ML POUR (IV SOLUTION) ×3 IMPLANT
WRAP KNEE MAXI GEL POST OP (GAUZE/BANDAGES/DRESSINGS) ×3 IMPLANT

## 2013-09-27 NOTE — H&P (View-Only) (Signed)
Pamela Dominguez. Pamela Dominguez  DOB: [birth year] Married / Language: English / Race: White Female  Date of Admission:  09-27-2013  Chief Complaint:  Right Knee Pain  History of Present Illness The patient is a 80 year old female who comes in for a preoperative History and Physical. The patient is scheduled for a right total knee arthroplasty to be performed by Dr. Dione Plover. Aluisio, MD at [facility] on 09-27-2013. The patient is a 80 year old female who presents for follow up of their knee. The patient is being followed for their bilateral knee pain and osteoarthritis.  Symptoms reported today include: pain and stiffness. The patient feels that they are doing poorly and report their pain level to be moderate. The following medication has been used for pain control: none (she had to stop the Aleve due to a diverticulitis flare).She was not able to do the therapy that was ordered, due to a surgery. She stated after she had recovered enough from her surgery to begin therapy, we did not have any appointments available. Her right knee is getting progressively worse. She doesn't feel as though it is going to improve. It is limiting what she can and cannot do. She has had injections in the past and not beneficial. She is at a stage now where she is just ready to go ahead and get the knee fixed. They have been treated conservatively in the past for the above stated problem and despite conservative measures, they continue to have progressive pain and severe functional limitations and dysfunction. They have failed non-operative management including home exercise, medications, and injections. It is felt that they would benefit from undergoing total joint replacement. Risks and benefits of the procedure have been discussed with the patient and they elect to proceed with surgery. There are no active contraindications to surgery such as ongoing infection or rapidly progressive neurological  disease.  Allergies Axid *ULCER DRUGS*. Hives, Itching. Penicillins. Hives, Itching. Quinite *Antimalarials**. Hives, Itching.  Problem List/Past Medical Primary osteoarthritis of both knees (715.16) Perimenopausal atrophic vaginitis (627.3) Diverticulitis Of Colon High blood pressure Irritable bowel syndrome Urinary Tract Infection Degenerative Disc Disease Menopause Gastroesophageal Reflux Disease Hyperlipidemia Insomnia Osteopenia   Family History Congestive Heart Failure. mother Hypertension. mother Cerebrovascular Accident. First Degree Relatives. mother Cancer. father   Social History Marital status. married Exercise. Exercises daily; does running / walking and other Current work status. 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 no Drug/Alcohol Rehab (Currently). no Alcohol use. never consumed alcohol Tobacco use. Never smoker. never smoker Children. 2 Drug/Alcohol Rehab (Previously). no Pain Contract. no Tobacco / smoke exposure. no    Medication History Aspirin EC Low Strength (81MG  Tablet DR, Oral) Active. Multivitamins ( Oral) Active. Fish Oil (1200MG  Capsule, Oral) Active. Glucosamine Chondr 1500 Complx ( Oral) Active. PreserVision AREDS ( Oral) Active. Calcium 600 ( Oral) Specific dose unknown - Active. Vitamin B Complex ( Oral) Active. Potassium Chloride CR (20MEQ Tablet ER, Oral) Active. Hydrochlorothiazide (25MG  Tablet, Oral) Active. Cholestyramine (4GM/DOSE Powder, Oral) Active. Omeprazole (20MG  Capsule DR, Oral) Active. Amlodipine Besy-Benazepril HCl (10-40MG  Capsule, Oral) Active.   Past Surgical History Hysterectomy. Date: 33. complete (non-cancerous) Appendectomy. Date: 30. Arthroscopy of Knee. Date: 63. left Tonsillectomy. Date: 28. Sinus Surgery. Date: 21. Straighten Nasal Septum Gallbladder Surgery. Date: 1999. laporoscopic   Review of Systems General:Not  Present- Chills, Fever, Night Sweats, Fatigue, Weight Gain, Weight Loss and Memory Loss. Skin:Not Present- Hives, Itching, Rash, Eczema and Lesions. HEENT:Not Present- Tinnitus, Headache, Double Vision,  Visual Loss, Hearing Loss and Dentures. Respiratory:Not Present- Shortness of breath with exertion, Shortness of breath at rest, Allergies, Coughing up blood and Chronic Cough. Cardiovascular:Not Present- Chest Pain, Racing/skipping heartbeats, Difficulty Breathing Lying Down, Murmur, Swelling and Palpitations. Gastrointestinal:Not Present- Bloody Stool, Heartburn, Abdominal Pain, Vomiting, Nausea, Constipation, Diarrhea, Difficulty Swallowing, Jaundice and Loss of appetitie. Female Genitourinary:Not Present- Blood in Urine, Urinary frequency, Weak urinary stream, Discharge, Flank Pain, Incontinence, Painful Urination, Urgency, Urinary Retention and Urinating at Night. Musculoskeletal:Present- Joint Swelling and Morning Stiffness. Not Present- Muscle Weakness, Muscle Pain, Joint Pain, Back Pain and Spasms. Neurological:Not Present- Tremor, Dizziness, Blackout spells, Paralysis, Difficulty with balance and Weakness. Psychiatric:Not Present- Insomnia.    Vitals Weight: 139 lb Height: 63 in Weight was reported by patient. Height was reported by patient. Body Surface Area: 1.67 m Body Mass Index: 24.62 kg/m Pulse: 68 (Regular) Resp.: 12 (Unlabored) BP: 120/68 (Sitting, Right Arm, Standard)     Physical Exam The physical exam findings are as follows:   General Mental Status - Alert, cooperative and good historian. General Appearance- pleasant. Not in acute distress. Orientation- Oriented X3. Build & Nutrition- Well nourished and Well developed.   Head and Neck Head- normocephalic, atraumatic . Neck Global Assessment- supple. no bruit auscultated on the right and no bruit auscultated on the left.   Eye Vision- Wears corrective lenses. Pupil-  Bilateral- Regular and Round. Motion- Bilateral- EOMI.   Chest and Lung Exam Auscultation: Breath sounds:- clear at anterior chest wall and - clear at posterior chest wall. Adventitious sounds:- No Adventitious sounds.   Cardiovascular Auscultation:Rhythm- Regular rate and rhythm. Heart Sounds- S1 WNL and S2 WNL. Murmurs & Other Heart Sounds:Auscultation of the heart reveals - No Murmurs.   Abdomen Palpation/Percussion:Tenderness- Abdomen is non-tender to palpation. Rigidity (guarding)- Abdomen is soft. Auscultation:Auscultation of the abdomen reveals - Bowel sounds normal.   Female Genitourinary Not done, not pertinent to present illness  Musculoskeletal Her right knee shows no effusion. There is a varus deformity noted. Her range of motion is about 5 to 125 with marked crepitus on range of motion, tenderness medial greater than lateral with no instability.  RADIOGRAPHS: Reviewed her radiographs. Once again she has bone on bone arthritis, medial and patellofemoral.   Assessment & Plan Primary osteoarthritis of both knees (715.16) Impression: Right Knee  Note: Plan is for a Right Total Knee Replacement by Dr. Wynelle Link.  Plan is to go SNF - [facility] in [place]  PCP - Dr. Gilford Rile - Pending clearance at the time of office H&P. She had an abnormal EKG in Dr. Willette Pa office on her preop evaluation and has a nuclear stress test pending at the time of her H&P.  The patient does not have any contraindications and will receive TXA (tranexamic acid) prior to surgery.  Signed electronically by Joelene Millin, III PA-C

## 2013-09-27 NOTE — Op Note (Signed)
Pre-operative diagnosis- Osteoarthritis  Right knee(s)  Post-operative diagnosis- Osteoarthritis Right knee(s)  Procedure-  Right  Total Knee Arthroplasty  Surgeon- Dione Plover. Ravin Bendall, MD  Assistant- Ardeen Jourdain, PA-C   Anesthesia-  Spinal EBL-* No blood loss amount entered *  Drains Hemovac  Tourniquet time-  Total Tourniquet Time Documented: Thigh (Right) - 29 minutes Total: Thigh (Right) - 29 minutes    Complications- None  Condition-PACU - hemodynamically stable.   Brief Clinical Note  Pamela Dominguez is a 80 y.o. year old female with end stage OA of her right knee with progressively worsening pain and dysfunction. She has constant pain, with activity and at rest and significant functional deficits with difficulties even with ADLs. She has had extensive non-op management including analgesics, injections of cortisone and viscosupplements, and home exercise program, but remains in significant pain with significant dysfunction.Radiographs show bone on bone arthritis medial and patellofemoral. She presents now for right Total Knee Arthroplasty.    Procedure in detail---   The patient is brought into the operating room and positioned supine on the operating table. After successful administration of  Spinal,   a tourniquet is placed high on the  Right thigh(s) and the lower extremity is prepped and draped in the usual sterile fashion. Time out is performed by the operating team and then the  Right lower extremity is wrapped in Esmarch, knee flexed and the tourniquet inflated to 300 mmHg.       A midline incision is made with a ten blade through the subcutaneous tissue to the level of the extensor mechanism. A fresh blade is used to make a medial parapatellar arthrotomy. Soft tissue over the proximal medial tibia is subperiosteally elevated to the joint line with a knife and into the semimembranosus bursa with a Cobb elevator. Soft tissue over the proximal lateral tibia is elevated with  attention being paid to avoiding the patellar tendon on the tibial tubercle. The patella is everted, knee flexed 90 degrees and the ACL and PCL are removed. Findings are bone on bone medial and patellofemoral with large global osteophytes.        The drill is used to create a starting hole in the distal femur and the canal is thoroughly irrigated with sterile saline to remove the fatty contents. The 5 degree Right  valgus alignment guide is placed into the femoral canal and the distal femoral cutting block is pinned to remove 10 mm off the distal femur. Resection is made with an oscillating saw.      The tibia is subluxed forward and the menisci are removed. The extramedullary alignment guide is placed referencing proximally at the medial aspect of the tibial tubercle and distally along the second metatarsal axis and tibial crest. The block is pinned to remove 12mm off the more deficient medial  side. Resection is made with an oscillating saw. Size 2.5is the most appropriate size for the tibia and the proximal tibia is prepared with the modular drill and keel punch for that size.      The femoral sizing guide is placed and size 2.5 is most appropriate. Rotation is marked off the epicondylar axis and confirmed by creating a rectangular flexion gap at 90 degrees. The size 2.5 cutting block is pinned in this rotation and the anterior, posterior and chamfer cuts are made with the oscillating saw. The intercondylar block is then placed and that cut is made.      Trial size 2.5 tibial component, trial size 2.5 posterior  stabilized femur and a 12.5  mm posterior stabilized rotating platform insert trial is placed. Full extension is achieved with excellent varus/valgus and anterior/posterior balance throughout full range of motion. The patella is everted and thickness measured to be 22  mm. Free hand resection is taken to 12 mm, a 35 template is placed, lug holes are drilled, trial patella is placed, and it tracks  normally. Osteophytes are removed off the posterior femur with the trial in place. All trials are removed and the cut bone surfaces prepared with pulsatile lavage. Cement is mixed and once ready for implantation, the size 2.5 tibial implant, size  2.5 posterior stabilized femoral component, and the size 35 patella are cemented in place and the patella is held with the clamp. The trial insert is placed and the knee held in full extension. The Exparel (20 ml mixed with 30 ml saline) and .25% Bupivicaine, are injected into the extensor mechanism, posterior capsule, medial and lateral gutters and subcutaneous tissues.  All extruded cement is removed and once the cement is hard the permanent 12.5 mm posterior stabilized rotating platform insert is placed into the tibial tray.      The wound is copiously irrigated with saline solution and the extensor mechanism closed over a hemovac drain with #1 PDS suture. The tourniquet is released for a total tourniquet time of 29  minutes. Flexion against gravity is 140 degrees and the patella tracks normally. Subcutaneous tissue is closed with 2.0 vicryl and subcuticular with running 4.0 Monocryl. The incision is cleaned and dried and steri-strips and a bulky sterile dressing are applied. The limb is placed into a knee immobilizer and the patient is awakened and transported to recovery in stable condition.      Please note that a surgical assistant was a medical necessity for this procedure in order to perform it in a safe and expeditious manner. Surgical assistant was necessary to retract the ligaments and vital neurovascular structures to prevent injury to them and also necessary for proper positioning of the limb to allow for anatomic placement of the prosthesis.   Dione Plover Udell Mazzocco, MD    09/27/2013, 12:40 PM

## 2013-09-27 NOTE — Transfer of Care (Signed)
Immediate Anesthesia Transfer of Care Note  Patient: Pamela Dominguez  Procedure(s) Performed: Procedure(s) (LRB): RIGHT TOTAL KNEE ARTHROPLASTY (Right)  Patient Location: PACU  Anesthesia Type: Spinal  Level of Consciousness: sedated, patient cooperative and responds to stimulation  Airway & Oxygen Therapy: Patient Spontanous Breathing and Patient connected to face mask oxgen  Post-op Assessment: Report given to PACU RN and Post -op Vital signs reviewed and stable  Post vital signs: Reviewed and stable, T-10 level on exam denied pain.   Complications: No apparent anesthesia complications

## 2013-09-27 NOTE — Interval H&P Note (Signed)
History and Physical Interval Note:  09/27/2013 9:46 AM  Pamela Dominguez  has presented today for surgery, with the diagnosis of OSTEOARTHRITIS RIGHT KNEE  The various methods of treatment have been discussed with the patient and family. After consideration of risks, benefits and other options for treatment, the patient has consented to  Procedure(s): RIGHT TOTAL KNEE ARTHROPLASTY (Right) as a surgical intervention .  The patient's history has been reviewed, patient examined, no change in status, stable for surgery.  I have reviewed the patient's chart and labs.  Questions were answered to the patient's satisfaction.     Gearlean Alf

## 2013-09-27 NOTE — Anesthesia Preprocedure Evaluation (Signed)
Anesthesia Evaluation  Patient identified by MRN, date of birth, ID band Patient awake    Reviewed: Allergy & Precautions, H&P , NPO status , Patient's Chart, lab work & pertinent test results  Airway Mallampati: II TM Distance: >3 FB Neck ROM: Full    Dental no notable dental hx.    Pulmonary neg pulmonary ROS,  breath sounds clear to auscultation  Pulmonary exam normal       Cardiovascular hypertension, Pt. on medications Rhythm:Regular Rate:Normal     Neuro/Psych negative neurological ROS  negative psych ROS   GI/Hepatic negative GI ROS, Neg liver ROS,   Endo/Other  negative endocrine ROS  Renal/GU negative Renal ROS  negative genitourinary   Musculoskeletal negative musculoskeletal ROS (+)   Abdominal   Peds negative pediatric ROS (+)  Hematology negative hematology ROS (+)   Anesthesia Other Findings   Reproductive/Obstetrics negative OB ROS                           Anesthesia Physical Anesthesia Plan  ASA: II  Anesthesia Plan: Spinal   Post-op Pain Management:    Induction: Intravenous  Airway Management Planned: Simple Face Mask  Additional Equipment:   Intra-op Plan:   Post-operative Plan:   Informed Consent: I have reviewed the patients History and Physical, chart, labs and discussed the procedure including the risks, benefits and alternatives for the proposed anesthesia with the patient or authorized representative who has indicated his/her understanding and acceptance.   Dental advisory given  Plan Discussed with: CRNA and Surgeon  Anesthesia Plan Comments:         Anesthesia Quick Evaluation  

## 2013-09-27 NOTE — Evaluation (Signed)
Physical Therapy Evaluation Patient Details Name: Pamela Dominguez MRN: [id number] DOB: [birth year] Today's Date: 09/27/2013 Time: 9741-6384 PT Time Calculation (min): 18 min  PT Assessment / Plan / Recommendation History of Present Illness  RTKA  Clinical Impression  Pt tolerated getting up to stand and take a few steps. Began to feel a little nausea. Pt will benefit from PT to address problems . Plans to go to snf    PT Assessment  Patient needs continued PT services    Follow Up Recommendations  SNF    Does the patient have the potential to tolerate intense rehabilitation      Barriers to Discharge        Equipment Recommendations  Rolling walker with 5" wheels    Recommendations for Other Services     Frequency 7X/week    Precautions / Restrictions Precautions Precautions: Fall;Knee Required Braces or Orthoses: Knee Immobilizer - Right Knee Immobilizer - Right: Discontinue once straight leg raise with < 10 degree lag Restrictions Weight Bearing Restrictions: No   Pertinent Vitals/Pain Pain is < 3 R knee      Mobility  Bed Mobility Overal bed mobility: Needs Assistance Bed Mobility: Supine to Sit;Sit to Supine Supine to sit: Min assist Sit to supine: Min assist Transfers Overall transfer level: Needs assistance Equipment used: Rolling walker (2 wheeled) Transfers: Sit to/from Stand Sit to Stand: +2 safety/equipment General transfer comment: cues for Hand and Rleg placement. Ambulation/Gait Ambulation/Gait assistance: +2 safety/equipment;Min assist Ambulation Distance (Feet): 5 Feet Assistive device: Rolling walker (2 wheeled) General Gait Details: became nauseated and backed up to the bed., cues for safety , WBS    Exercises     PT Diagnosis: Difficulty walking;Acute pain  PT Problem List: Decreased strength;Decreased range of motion;Decreased activity tolerance;Decreased mobility;Decreased knowledge of precautions;Pain;Decreased safety awareness;Decreased  knowledge of use of DME PT Treatment Interventions: DME instruction;Gait training;Functional mobility training;Therapeutic activities;Therapeutic exercise;Patient/family education     PT Goals(Current goals can be found in the care plan section) Acute Rehab PT Goals Patient Stated Goal: I want to walk without pain. PT Goal Formulation: With patient/family Time For Goal Achievement: 10/04/13 Potential to Achieve Goals: Good  Visit Information  Last PT Received On: 09/27/13 Assistance Needed: +2 History of Present Illness: RTKA       Prior Functioning  Home Living Family/patient expects to be discharged to:: Skilled nursing facility Living Arrangements: Spouse/significant other Prior Function Level of Independence: Independent Communication Communication: No difficulties    Cognition  Cognition Arousal/Alertness: Awake/alert Behavior During Therapy: WFL for tasks assessed/performed Overall Cognitive Status: Within Functional Limits for tasks assessed    Extremity/Trunk Assessment Upper Extremity Assessment Upper Extremity Assessment: Defer to OT evaluation Lower Extremity Assessment Lower Extremity Assessment: RLE deficits/detail RLE Deficits / Details: performs SLR   Balance    End of Session PT - End of Session Equipment Utilized During Treatment: Right knee immobilizer Activity Tolerance: Treatment limited secondary to medical complications (Comment) Patient left: in bed;with call bell/phone within reach;with family/visitor present Nurse Communication: Mobility status CPM Right Knee CPM Right Knee: Off  GP     Claretha Cooper 09/27/2013, 6:09 PM

## 2013-09-27 NOTE — Anesthesia Postprocedure Evaluation (Signed)
  Anesthesia Post-op Note  Patient: Pamela Dominguez  Procedure(s) Performed: Procedure(s) (LRB): RIGHT TOTAL KNEE ARTHROPLASTY (Right)  Patient Location: PACU  Anesthesia Type: Spinal  Level of Consciousness: awake and alert   Airway and Oxygen Therapy: Patient Spontanous Breathing  Post-op Pain: mild  Post-op Assessment: Post-op Vital signs reviewed, Patient's Cardiovascular Status Stable, Respiratory Function Stable, Patent Airway and No signs of Nausea or vomiting  Last Vitals:  Filed Vitals:   09/27/13 1330  BP: 112/65  Pulse: 63  Temp: 36.4 C  Resp: 11    Post-op Vital Signs: stable   Complications: No apparent anesthesia complications

## 2013-09-27 NOTE — Progress Notes (Signed)
Utilization review completed.  

## 2013-09-27 NOTE — Anesthesia Procedure Notes (Signed)
Spinal  Patient location during procedure: OR Start time: 09/27/2013 11:44 AM End time: 09/27/2013 11:48 AM Staffing CRNA/Resident: Anne Fu Performed by: resident/CRNA  Preanesthetic Checklist Completed: patient identified, site marked, surgical consent, pre-op evaluation, timeout performed, IV checked, risks and benefits discussed and monitors and equipment checked Spinal Block Patient position: sitting Prep: Betadine Patient monitoring: heart rate, continuous pulse ox and blood pressure Approach: right paramedian Location: L2-3 Injection technique: single-shot Needle Needle type: Spinocan  Needle gauge: 22 G Needle length: 9 cm Assessment Sensory level: T4 Additional Notes Expiration date of kit checked and confirmed. Patient tolerated procedure well, without complications. X 1 attempt with CSF return administration of medication without incidences.  Patient tolerated procedure well. Noted T4 level on exam.

## 2013-09-28 DIAGNOSIS — I10 Essential (primary) hypertension: Secondary | ICD-10-CM

## 2013-09-28 DIAGNOSIS — E785 Hyperlipidemia, unspecified: Secondary | ICD-10-CM

## 2013-09-28 DIAGNOSIS — K589 Irritable bowel syndrome without diarrhea: Secondary | ICD-10-CM

## 2013-09-28 DIAGNOSIS — K5730 Diverticulosis of large intestine without perforation or abscess without bleeding: Secondary | ICD-10-CM

## 2013-09-28 DIAGNOSIS — K219 Gastro-esophageal reflux disease without esophagitis: Secondary | ICD-10-CM

## 2013-09-28 HISTORY — DX: Irritable bowel syndrome, unspecified: K58.9

## 2013-09-28 HISTORY — DX: Hyperlipidemia, unspecified: E78.5

## 2013-09-28 HISTORY — DX: Essential (primary) hypertension: I10

## 2013-09-28 HISTORY — DX: Diverticulosis of large intestine without perforation or abscess without bleeding: K57.30

## 2013-09-28 LAB — CBC
HEMATOCRIT: 32.8 % — AB (ref 36.0–46.0)
Hemoglobin: 11.3 g/dL — ABNORMAL LOW (ref 12.0–15.0)
MCH: 30.3 pg (ref 26.0–34.0)
MCHC: 34.5 g/dL (ref 30.0–36.0)
MCV: 87.9 fL (ref 78.0–100.0)
Platelets: 185 10*3/uL (ref 150–400)
RBC: 3.73 MIL/uL — ABNORMAL LOW (ref 3.87–5.11)
RDW: 12.5 % (ref 11.5–15.5)
WBC: 10.5 10*3/uL (ref 4.0–10.5)

## 2013-09-28 LAB — BASIC METABOLIC PANEL
BUN: 8 mg/dL (ref 6–23)
CALCIUM: 8.6 mg/dL (ref 8.4–10.5)
CO2: 24 meq/L (ref 19–32)
CREATININE: 0.45 mg/dL — AB (ref 0.50–1.10)
Chloride: 101 mEq/L (ref 96–112)
GFR calc Af Amer: >90 mL/min (ref >90)
GFR calc non Af Amer: >90 mL/min (ref >90)
GLUCOSE: 130 mg/dL — AB (ref 70–99)
Potassium: 4.2 mEq/L (ref 3.7–5.3)
Sodium: 139 mEq/L (ref 137–147)

## 2013-09-28 MED ORDER — OMEPRAZOLE 20 MG PO CPDR
20.0000 mg | DELAYED_RELEASE_CAPSULE | Freq: Every day | ORAL | Status: DC
  Administered 2013-09-29: 20 mg via ORAL
  Filled 2013-09-28 (×2): qty 1

## 2013-09-28 NOTE — Progress Notes (Signed)
Clinical Social Work Department CLINICAL SOCIAL WORK PLACEMENT NOTE 09/28/2013  Patient:  Pamela Dominguez, Pamela Dominguez  Account Number:  [id number] Admit date:  09/27/2013  Clinical Social Worker:  Werner Lean, LCSW  Date/time:  09/28/2013 12:22 PM  Clinical Social Work is seeking post-discharge placement for this patient at the following level of care:   SKILLED NURSING   (*CSW will update this form in Epic as items are completed)     Patient/family provided with [facility] Department of Clinical Social Work's list of facilities offering this level of care within the geographic area requested by the patient (or if unable, by the patient's family).  09/28/2013  Patient/family informed of their freedom to choose among providers that offer the needed level of care, that participate in Medicare, Medicaid or managed care program needed by the patient, have an available bed and are willing to accept the patient.    Patient/family informed of [facility]' ownership interest in [facility], as well as of the fact that they are under no obligation to receive care at this facility.  PASARR submitted to EDS on 09/28/2013 PASARR number received from [employer] on 09/28/2013  FL2 transmitted to all facilities in geographic area requested by pt/family on  09/28/2013 FL2 transmitted to all facilities within larger geographic area on   Patient informed that his/her managed care company has contracts with or will negotiate with  certain facilities, including the following:     Patient/family informed of bed offers received:  09/28/2013 Patient chooses bed at  Physician recommends and patient chooses bed at    Patient to be transferred to  on   Patient to be transferred to facility by   The following physician request were entered in Epic:   Additional Comments:  Werner Lean LCSW [phone]

## 2013-09-28 NOTE — Progress Notes (Signed)
Physical Therapy Treatment Patient Details Name: Pamela Dominguez MRN: [id number] DOB: 1933/07/03 Today's Date: 09/28/2013 Time: 1445-1500 PT Time Calculation (min): 15 min  PT Assessment / Plan / Recommendation  History of Present Illness RTKA   PT Comments   POD#1 pm session.  Applied KI then assisted pt OOB to amb in hallway second time.  Limited activity tolerance due to increased c/o pain > this am.  Assisted back to bed for CPM.  Pt progressing slowly and will need ST Rehab at SNF prior to D/C to home.  Follow Up Recommendations  SNF     Does the patient have the potential to tolerate intense rehabilitation     Barriers to Discharge        Equipment Recommendations  Rolling walker with 5" wheels    Recommendations for Other Services    Frequency 7X/week   Progress towards PT Goals Progress towards PT goals: Progressing toward goals  Plan      Precautions / Restrictions Precautions Precautions: Fall;Knee Precaution Comments: Instructed pt on KI use for amb Required Braces or Orthoses: Knee Immobilizer - Right Knee Immobilizer - Right: Discontinue once straight leg raise with < 10 degree lag Restrictions Weight Bearing Restrictions: No Other Position/Activity Restrictions: WBAT   Pertinent Vitals/Pain C/o 8/10 ICE applied    Mobility  Bed Mobility Overal bed mobility: Needs Assistance Bed Mobility: Supine to Sit;Sit to Supine Supine to sit: Min assist Sit to supine: Min assist General bed mobility comments: min assist to support R LE Transfers Overall transfer level: Needs assistance Equipment used: Rolling walker (2 wheeled) Transfers: Sit to/from Stand Sit to Stand: Min assist General transfer comment: assist to rise and steady. 25% cues for UE/LE placement plus increased time Ambulation/Gait Ambulation/Gait assistance: Min assist Ambulation Distance (Feet): 24 Feet Assistive device: Rolling walker (2 wheeled) Gait Pattern/deviations: Step-to  pattern;Decreased stance time - right Gait velocity: decreased General Gait Details: 50% VC's on proper walker to self distance and proper R LE placement within walker.      PT Goals (current goals can now be found in the care plan section)    Visit Information  Last PT Received On: 09/28/13 Assistance Needed: +1 History of Present Illness: RTKA    Subjective Data      Cognition       Balance     End of Session PT - End of Session Equipment Utilized During Treatment: Right knee immobilizer;Gait belt Activity Tolerance: Patient limited by fatigue;Patient limited by pain Patient left: in bed;with call bell/phone within reach;with family/visitor present Nurse Communication: Mobility status   Rica Koyanagi  PTA [facility]  Acute  Rehab Pager      [phone]

## 2013-09-28 NOTE — Progress Notes (Signed)
Physical Therapy Treatment Patient Details Name: Pamela Dominguez MRN: [id number] DOB: [birth year] Today's Date: 09/28/2013 Time: 6269-4854 PT Time Calculation (min): 25 min  PT Assessment / Plan / Recommendation  History of Present Illness RTKA   PT Comments   POD # 1 am session.  Pt OOB in recliner.  Instructed pt on KI use for amb and applied.  Amb limited distance.  Pt required increased time.  Performed AP and knee presses followed by ICE.   Follow Up Recommendations  SNF ([facility])     Does the patient have the potential to tolerate intense rehabilitation     Barriers to Discharge        Equipment Recommendations  Rolling walker with 5" wheels    Recommendations for Other Services    Frequency 7X/week   Progress towards PT Goals Progress towards PT goals: Progressing toward goals  Plan      Precautions / Restrictions Precautions Precautions: Fall;Knee Precaution Comments: Instructed pt on KI use for amb Required Braces or Orthoses: Knee Immobilizer - Right Knee Immobilizer - Right: Discontinue once straight leg raise with < 10 degree lag Restrictions Weight Bearing Restrictions: No Other Position/Activity Restrictions: WBAT   Pertinent Vitals/Pain C/o 5/10 pain Pre medicated ICE applied    Mobility  Bed Mobility General bed mobility comments: Pt OOB in recliner Transfers Overall transfer level: Needs assistance Equipment used: Rolling walker (2 wheeled) Transfers: Sit to/from Stand Sit to Stand: Min assist General transfer comment: assist to rise and steady. 25% cues for UE/LE placement Ambulation/Gait Ambulation/Gait assistance: Min assist Ambulation Distance (Feet): 22 Feet Assistive device: Rolling walker (2 wheeled) Gait Pattern/deviations: Step-to pattern;Decreased stance time - right;Trunk flexed Gait velocity: decreased General Gait Details: 50% VC's on proper walker to self distance and proper R LE placement within walker.    Exercises  10 reps  AP 10 reps Knee Presses   PT Goals (current goals can now be found in the care plan section)    Visit Information  Last PT Received On: 09/28/13 Assistance Needed: +1 History of Present Illness: RTKA    Subjective Data      Cognition       Balance     End of Session PT - End of Session Equipment Utilized During Treatment: Right knee immobilizer Activity Tolerance: Patient limited by fatigue Patient left: in chair;with call bell/phone within reach;with family/visitor present   Rica Koyanagi  PTA [facility]  Acute  Rehab Pager      [phone]

## 2013-09-28 NOTE — Progress Notes (Signed)
   Subjective: 1 Day Post-Op Procedure(s) (LRB): RIGHT TOTAL KNEE ARTHROPLASTY (Right) Patient reports pain as mild.   Patient seen in rounds with Dr. Wynelle Link.  Had a good night.  Sitting up eating breakfast on morning rounds. Patient is well, and has had no acute complaints or problems We will start therapy today.  Plan is to go SNF - [facility] after hospital stay.  Objective: Vital signs in last 24 hours: Temp:  [97.2 F (36.2 C)-98.2 F (36.8 C)] 98.2 F (36.8 C) (03/03 0600) Pulse Rate:  [63-85] 74 (03/03 0600) Resp:  [11-19] 16 (03/03 0715) BP: (111-160)/(63-83) 159/80 mmHg (03/03 0600) SpO2:  [94 %-100 %] 96 % (03/03 0715) Weight:  [65.499 kg (144 lb 6.4 oz)] 65.499 kg (144 lb 6.4 oz) (03/02 1430)  Intake/Output from previous day:  Intake/Output Summary (Last 24 hours) at 09/28/13 0837 Last data filed at 09/28/13 0601  Gross per 24 hour  Intake 3986.25 ml  Output   5122 ml  Net -1135.75 ml    Intake/Output this shift: UOP 1350 since MN -1135 - Negative on fluids this morning. Monitor for symptoms.  Fluid bolus if needed.  Labs:  Recent Labs  09/28/13 0445  HGB 11.3*    Recent Labs  09/28/13 0445  WBC 10.5  RBC 3.73*  HCT 32.8*  PLT 185    Recent Labs  09/28/13 0445  NA 139  K 4.2  CL 101  CO2 24  BUN 8  CREATININE 0.45*  GLUCOSE 130*  CALCIUM 8.6   No results found for this basename: LABPT, INR,  in the last 72 hours  EXAM General - Patient is Alert, Appropriate and Oriented Extremity - Neurovascular intact Sensation intact distally Dorsiflexion/Plantar flexion intact Dressing - dressing C/D/I Motor Function - intact, moving foot and toes well on exam.  Hemovac pulled without difficulty.  Past Medical History  Diagnosis Date  . Hypertension   . GERD (gastroesophageal reflux disease)   . Arthritis   . TMJ (dislocation of temporomandibular joint)   . Complication of anesthesia 1989    mouth joint trouble after hysterctomy,  told by dentist to haver anesthesia use bite block to keep mouth open same amount all over  . Family history of anesthesia complication     brother low bp, heart problems, ileus  . IBS (irritable bowel syndrome)   . Hyperlipidemia   . Diverticulosis   . Osteopenia   . DJD (degenerative joint disease)   . Chronic lower back pain     Assessment/Plan: 1 Day Post-Op Procedure(s) (LRB): RIGHT TOTAL KNEE ARTHROPLASTY (Right) Principal Problem:   OA (osteoarthritis) of knee Active Problems:   Unspecified essential hypertension - Lotrel on hold due to the ACE-I, will resume depending upon BP's, HCTZ resumed.   GERD (gastroesophageal reflux disease) - back on Prilosec   Irritable bowel syndrome - no complaints but will monitor for changes.   Other and unspecified hyperlipidemia - back on Questran   Diverticulosis of colon (without mention of hemorrhage) - no abdominal complaints this AM  Estimated body mass index is 26.4 kg/(m^2) as calculated from the following:   Height as of this encounter: 5\' 2"  (1.575 m).   Weight as of this encounter: 65.499 kg (144 lb 6.4 oz). Advance diet Up with therapy Discharge to SNF - [facility]  DVT Prophylaxis - Xarelto Weight-Bearing as tolerated to right leg D/C O2 and Pulse OX and try on Room Air  PERKINS, Augusto Garbe 09/28/2013, 8:37 AM \

## 2013-09-28 NOTE — Discharge Instructions (Addendum)
° °Dr. Frank Aluisio °Total Joint Specialist °[facility] °[address] °[place], [place] [address] °[phone] ° °TOTAL KNEE REPLACEMENT POSTOPERATIVE DIRECTIONS ° ° ° °Knee Rehabilitation, Guidelines Following Surgery  °Results after knee surgery are often greatly improved when you follow the exercise, range of motion and muscle strengthening exercises prescribed by your doctor. Safety measures are also important to protect the knee from further injury. Any time any of these exercises cause you to have increased pain or swelling in your knee joint, decrease the amount until you are comfortable again and slowly increase them. If you have problems or questions, call your caregiver or physical therapist for advice.  ° °HOME CARE INSTRUCTIONS  °Remove items at home which could result in a fall. This includes throw rugs or furniture in walking pathways.  °Continue medications as instructed at time of discharge. °You may have some home medications which will be placed on hold until you complete the course of blood thinner medication.  °You may start showering once you are discharged home but do not submerge the incision under water. Just pat the incision dry and apply a dry gauze dressing on daily. °Walk with walker as instructed.  °You may resume a sexual relationship in one month or when given the OK by  your doctor.  °· Use walker as long as suggested by your caregivers. °· Avoid periods of inactivity such as sitting longer than an hour when not asleep. This helps prevent blood clots.  °You may put full weight on your legs and walk as much as is comfortable.  °You may return to work once you are cleared by your doctor.  °Do not drive a car for 6 weeks or until released by you surgeon.  °· Do not drive while taking narcotics.  °Wear the elastic stockings for three weeks following surgery during the day but you may remove then at night. °Make sure you keep all of your appointments after your  operation with all of your doctors and caregivers. You should call the office at the above phone number and make an appointment for approximately two weeks after the date of your surgery. °Change the dressing daily and reapply a dry dressing each time. °Please pick up a stool softener and laxative for home use as long as you are requiring pain medications. °· Continue to use ice on the knee for pain and swelling from surgery. You may notice swelling that will progress down to the foot and ankle.  This is normal after surgery.  Elevate the leg when you are not up walking on it.   °It is important for you to complete the blood thinner medication as prescribed by your doctor. °· Continue to use the breathing machine which will help keep your temperature down.  It is common for your temperature to cycle up and down following surgery, especially at night when you are not up moving around and exerting yourself.  The breathing machine keeps your lungs expanded and your temperature down. ° °RANGE OF MOTION AND STRENGTHENING EXERCISES  °Rehabilitation of the knee is important following a knee injury or an operation. After just a few days of immobilization, the muscles of the thigh which control the knee become weakened and shrink (atrophy). Knee exercises are designed to build up the tone and strength of the thigh muscles and to improve knee motion. Often times heat used for twenty to thirty minutes before working out will loosen up your tissues and help with improving the   range of motion but do not use heat for the first two weeks following surgery. These exercises can be done on a training (exercise) mat, on the floor, on a table or on a bed. Use what ever works the best and is most comfortable for you Knee exercises include:  °Leg Lifts - While your knee is still immobilized in a splint or cast, you can do straight leg raises. Lift the leg to 60 degrees, hold for 3 sec, and slowly lower the leg. Repeat 10-20 times 2-3  times daily. Perform this exercise against resistance later as your knee gets better.  °Quad and Hamstring Sets - Tighten up the muscle on the front of the thigh (Quad) and hold for 5-10 sec. Repeat this 10-20 times hourly. Hamstring sets are done by pushing the foot backward against an object and holding for 5-10 sec. Repeat as with quad sets.  °A rehabilitation program following serious knee injuries can speed recovery and prevent re-injury in the future due to weakened muscles. Contact your doctor or a physical therapist for more information on knee rehabilitation.  ° °SKILLED REHAB INSTRUCTIONS: °If the patient is transferred to a skilled rehab facility following release from the hospital, a list of the current medications will be sent to the facility for the patient to continue.  When discharged from the skilled rehab facility, please have the facility set up the patient's Home Health Physical Therapy prior to being released. Also, the skilled facility will be responsible for providing the patient with their medications at time of release from the facility to include their pain medication, the muscle relaxants, and their blood thinner medication. If the patient is still at the rehab facility at time of the two week follow up appointment, the skilled rehab facility will also need to assist the patient in arranging follow up appointment in our office and any transportation needs. ° °MAKE SURE YOU:  °Understand these instructions.  °Will watch your condition.  °Will get help right away if you are not doing well or get worse.  ° ° °Pick up stool softner and laxative for home. °Do not submerge incision under water. °May shower. °Continue to use ice for pain and swelling from surgery. ° ° °Take Xarelto for two and a half more weeks, then discontinue Xarelto. °Once the patient has completed the Xarelto, they may resume the 81 mg Aspirin. ° °When discharged from the skilled rehab facility, please have the facility set  up the patient's Home Health Physical Therapy prior to being released.  Also provide the patient with their medications at time of release from the facility to include their pain medication, the muscle relaxants, and their blood thinner medication.  If the patient is still at the rehab facility at time of follow up appointment, please also assist the patient in arranging follow up appointment in our office and any transportation needs. ° ° ° ° °Information on my medicine - XARELTO® (Rivaroxaban) ° °This medication education was reviewed with me or my healthcare representative as part of my discharge preparation.  The pharmacist that spoke with me during my hospital stay was:  Absher, Randall K, RPH ° °Why was Xarelto® prescribed for you? °Xarelto® was prescribed for you to reduce the risk of blood clots forming after orthopedic surgery. The medical term for these abnormal blood clots is venous thromboembolism (VTE). ° °What do you need to know about xarelto® ? °Take your Xarelto® ONCE DAILY at the same time every day. °You may take it   either with or without food. ° °If you have difficulty swallowing the tablet whole, you may crush it and mix in applesauce just prior to taking your dose. ° °Take Xarelto® exactly as prescribed by your doctor and DO NOT stop taking Xarelto® without talking to the doctor who prescribed the medication.  Stopping without other VTE prevention medication to take the place of Xarelto® may increase your risk of developing a clot. ° °After discharge, you should have regular check-up appointments with your healthcare provider that is prescribing your Xarelto®.   ° °What do you do if you miss a dose? °If you miss a dose, take it as soon as you remember on the same day then continue your regularly scheduled once daily regimen the next day. Do not take two doses of Xarelto® on the same day.  ° °Important Safety Information °A possible side effect of Xarelto® is bleeding. You should call your  healthcare provider right away if you experience any of the following: °  Bleeding from an injury or your nose that does not stop. °  Unusual colored urine (red or dark brown) or unusual colored stools (red or black). °  Unusual bruising for unknown reasons. °  A serious fall or if you hit your head (even if there is no bleeding). ° °Some medicines may interact with Xarelto® and might increase your risk of bleeding while on Xarelto®. To help avoid this, consult your healthcare provider or pharmacist prior to using any new prescription or non-prescription medications, including herbals, vitamins, non-steroidal anti-inflammatory drugs (NSAIDs) and supplements. ° °This website has more information on Xarelto®: www.xarelto.com. ° ° °

## 2013-09-28 NOTE — Progress Notes (Signed)
Clinical Social Work Department BRIEF PSYCHOSOCIAL ASSESSMENT 09/28/2013  Patient:  Pamela Dominguez, Pamela Dominguez     Account Number:  [id number]     Admit date:  09/27/2013  Clinical Social Worker:  Lacie Scotts  Date/Time:  09/28/2013 12:03 PM  Referred by:  Physician  Date Referred:  09/28/2013 Referred for  SNF Placement   Other Referral:   Interview type:  Patient Other interview type:    PSYCHOSOCIAL DATA Living Status:  HUSBAND Admitted from facility:   Level of care:   Primary support name:  Gustavus Bryant Primary support relationship to patient:  SPOUSE Degree of support available:   supportive    CURRENT CONCERNS Current Concerns  Post-Acute Placement   Other Concerns:    SOCIAL WORK ASSESSMENT / PLAN Pt is a 80 yr old female living at home prior to hospitalization. CSW met with pt / family to assist with d/c planning. Pt has made prior arrangements to have [facility] Rehab at [facility] in [place] following hospital d/c. CSW has contacted [facility] and d/c plan has been confirmed pending [employer] authorization. CSW will initiate auth process on 3/4.   Assessment/plan status:  Psychosocial Support/Ongoing Assessment of Needs Other assessment/ plan:   Information/referral to community resources:   Insurance coverage for SNF placent and ambulance transport reviewed    PATIENT'S/FAMILY'S RESPONSE TO PLAN OF CARE: CSW met with pt shortly after she arrived to [place] from surgery. " I'm feeling OK. Not too bad." Pt was eager to talk about her rehab plans. She is looking forward to her stay at [facility].   Werner Lean LCSW [phone]

## 2013-09-28 NOTE — Evaluation (Signed)
Occupational Therapy Evaluation Patient Details Name: Pamela Dominguez MRN: [id number] DOB: 07-26-1933 Today's Date: 09/28/2013 Time: 7425-9563 OT Time Calculation (min): 44 min  OT Assessment / Plan / Recommendation History of present illness RTKA   Clinical Impression   Pt was admitted for the above surgery.  She will benefit from skilled OT to increase safety and independence with adls.  Goals in acute are for supervision level.      OT Assessment  Patient needs continued OT Services    Follow Up Recommendations  SNF    Barriers to Discharge      Equipment Recommendations  3 in 1 bedside comode    Recommendations for Other Services    Frequency  Min 2X/week    Precautions / Restrictions Precautions Precautions: Fall;Knee Required Braces or Orthoses: Knee Immobilizer - Right Knee Immobilizer - Right: Discontinue once straight leg raise with < 10 degree lag Restrictions Weight Bearing Restrictions: No   Pertinent Vitals/Pain 5/10 with movement.  Pt repositioned in chair and ice applied    ADL  Grooming: Teeth care;Brushing hair;Supervision/safety Where Assessed - Grooming: Supported standing Upper Body Bathing: Set up Where Assessed - Upper Body Bathing: Unsupported sitting Lower Body Bathing: Minimal assistance Where Assessed - Lower Body Bathing: Supported sit to stand Upper Body Dressing: Minimal assistance (iv) Where Assessed - Upper Body Dressing: Unsupported sitting Lower Body Dressing: Moderate assistance Where Assessed - Lower Body Dressing: Supported sit to stand Toilet Transfer: Minimal assistance;Simulated Armed forces technical officer Method: Sit to stand Toileting - Water quality scientist and Hygiene: Min guard Where Assessed - [place] and Hygiene: Sit to stand from 3-in-1 or toilet Equipment Used: Rolling walker Transfers/Ambulation Related to ADLs: ambulated to sink and chair with cues for sequence and cues for sit to stand.  Pt needs cues to  control descent ADL Comments: performed adl from eob.  Pt is able to lift RLE with KI on for adls    OT Diagnosis: Generalized weakness  OT Problem List: Decreased strength;Decreased range of motion;Decreased knowledge of use of DME or AE;Pain OT Treatment Interventions: Self-care/ADL training;DME and/or AE instruction;Patient/family education   OT Goals(Current goals can be found in the care plan section) Acute Rehab OT Goals Patient Stated Goal: walk without pain OT Goal Formulation: With patient Time For Goal Achievement: 10/05/13 Potential to Achieve Goals: Good ADL Goals Pt Will Perform Lower Body Bathing: with supervision;sit to/from stand;with adaptive equipment Pt Will Perform Lower Body Dressing: with supervision;with adaptive equipment;sit to/from stand Pt Will Transfer to Toilet: with supervision;bedside commode;ambulating Pt Will Perform Toileting - Clothing Manipulation and hygiene: with supervision;sit to/from stand Additional ADL Goal #1: pt will gather clothes at supervision level  Visit Information  Last OT Received On: 09/28/13 Assistance Needed: +1 History of Present Illness: RTKA       Prior [place] expects to be discharged to:: Skilled nursing facility Living Arrangements: Spouse/significant other Prior Function Level of Independence: Independent Communication Communication: No difficulties Dominant Hand: Right         Vision/Perception     Cognition  Cognition Arousal/Alertness: Awake/alert Behavior During Therapy: WFL for tasks assessed/performed Overall Cognitive Status: Within Functional Limits for tasks assessed    Extremity/Trunk Assessment Upper Extremity Assessment Upper Extremity Assessment: Overall WFL for tasks assessed     Mobility Bed Mobility Supine to sit: Min assist General bed mobility comments: hob raise and used rails Transfers Transfers: Sit to/from Stand Sit to Stand: Min  assist General transfer comment:  assist to rise and steady.  cues for UE/LE placement  Cues to control descent when sitting..  Assist for RLE   Exercise     Balance     End of Session OT - End of Session Activity Tolerance: Patient tolerated treatment well Patient left: in chair;with call bell/phone within reach CPM Right Knee CPM Right Knee: Off  GO     Sweet Jarvis 09/28/2013, 8:45 AM Lesle Chris, OTR/L [phone] 09/28/2013

## 2013-09-28 NOTE — Progress Notes (Signed)
09/28/2013 1537 No NCM needs identified. Jonnie Finner RN CCM Case Mgmt phone [phone]

## 2013-09-29 LAB — CBC
HEMATOCRIT: 29.4 % — AB (ref 36.0–46.0)
Hemoglobin: 10.1 g/dL — ABNORMAL LOW (ref 12.0–15.0)
MCH: 30.4 pg (ref 26.0–34.0)
MCHC: 34.4 g/dL (ref 30.0–36.0)
MCV: 88.6 fL (ref 78.0–100.0)
Platelets: 158 10*3/uL (ref 150–400)
RBC: 3.32 MIL/uL — ABNORMAL LOW (ref 3.87–5.11)
RDW: 13 % (ref 11.5–15.5)
WBC: 10 10*3/uL (ref 4.0–10.5)

## 2013-09-29 LAB — BASIC METABOLIC PANEL
BUN: 11 mg/dL (ref 6–23)
CO2: 27 meq/L (ref 19–32)
CREATININE: 0.42 mg/dL — AB (ref 0.50–1.10)
Calcium: 8.7 mg/dL (ref 8.4–10.5)
Chloride: 98 mEq/L (ref 96–112)
GFR calc Af Amer: >90 mL/min (ref >90)
GFR calc non Af Amer: >90 mL/min (ref >90)
Glucose, Bld: 131 mg/dL — ABNORMAL HIGH (ref 70–99)
Potassium: 3.6 mEq/L — ABNORMAL LOW (ref 3.7–5.3)
Sodium: 136 mEq/L — ABNORMAL LOW (ref 137–147)

## 2013-09-29 MED ORDER — TRAMADOL HCL 50 MG PO TABS: 50.0000 mg | ORAL_TABLET | Freq: Four times a day (QID) | ORAL | Status: DC | PRN

## 2013-09-29 MED ORDER — METOCLOPRAMIDE HCL 5 MG PO TABS: 5.0000 mg | ORAL_TABLET | Freq: Three times a day (TID) | ORAL | Status: DC | PRN

## 2013-09-29 MED ORDER — METHOCARBAMOL 500 MG PO TABS: 500.0000 mg | ORAL_TABLET | Freq: Four times a day (QID) | ORAL | Status: DC | PRN

## 2013-09-29 MED ORDER — OXYCODONE HCL 5 MG PO TABS: 5.0000 mg | ORAL_TABLET | ORAL | Status: DC | PRN

## 2013-09-29 MED ORDER — ONDANSETRON HCL 4 MG PO TABS: 4.0000 mg | ORAL_TABLET | Freq: Four times a day (QID) | ORAL | Status: DC | PRN

## 2013-09-29 MED ORDER — BISACODYL 10 MG RE SUPP: 10.0000 mg | Freq: Every day | RECTAL | Status: DC | PRN

## 2013-09-29 MED ORDER — RIVAROXABAN 10 MG PO TABS: 10.0000 mg | ORAL_TABLET | Freq: Every day | ORAL | Status: DC

## 2013-09-29 MED ORDER — POLYETHYLENE GLYCOL 3350 17 G PO PACK: 17.0000 g | PACK | Freq: Every day | ORAL | Status: DC | PRN

## 2013-09-29 MED ORDER — DSS 100 MG PO CAPS: 100.0000 mg | ORAL_CAPSULE | Freq: Two times a day (BID) | ORAL | Status: DC

## 2013-09-29 MED ORDER — CHOLESTYRAMINE 4 G PO PACK
4.0000 g | PACK | Freq: Every evening | ORAL | Status: DC | PRN
  Filled 2013-09-29: qty 1

## 2013-09-29 NOTE — Discharge Summary (Signed)
Physician Discharge Summary   Patient ID: Pamela Dominguez MRN: [id number] DOB/AGE: [birth year] 80 y.o.  Admit date: 09/27/2013 Discharge date: 09-29-2013  Primary Diagnosis:  Osteoarthritis Right knee(s)  Admission Diagnoses:  Past Medical History  Diagnosis Date  . Hypertension   . GERD (gastroesophageal reflux disease)   . Arthritis   . TMJ (dislocation of temporomandibular joint)   . Complication of anesthesia 1989    mouth joint trouble after hysterctomy, told by dentist to haver anesthesia use bite block to keep mouth open same amount all over  . Family history of anesthesia complication     brother low bp, heart problems, ileus  . IBS (irritable bowel syndrome)   . Hyperlipidemia   . Diverticulosis   . Osteopenia   . DJD (degenerative joint disease)   . Chronic lower back pain    Discharge Diagnoses:   Principal Problem:   OA (osteoarthritis) of knee Active Problems:   Unspecified essential hypertension   GERD (gastroesophageal reflux disease)   Irritable bowel syndrome   Other and unspecified hyperlipidemia   Diverticulosis of colon (without mention of hemorrhage)  Estimated body mass index is 26.4 kg/(m^2) as calculated from the following:   Height as of this encounter: 5' 2" (1.575 m).   Weight as of this encounter: 65.499 kg (144 lb 6.4 oz).  Procedure:  Procedure(s) (LRB): RIGHT TOTAL KNEE ARTHROPLASTY (Right)   Consults: None  HPI: Pamela Dominguez is a 80 y.o. year old female with end stage OA of her right knee with progressively worsening pain and dysfunction. She has constant pain, with activity and at rest and significant functional deficits with difficulties even with ADLs. She has had extensive non-op management including analgesics, injections of cortisone and viscosupplements, and home exercise program, but remains in significant pain with significant dysfunction.Radiographs show bone on bone arthritis medial and patellofemoral. She presents now for right Total  Knee Arthroplasty.   Laboratory Data: Admission on 09/27/2013  Component Date Value Ref Range Status  . WBC 09/28/2013 10.5  4.0 - 10.5 K/uL Final  . RBC 09/28/2013 3.73* 3.87 - 5.11 MIL/uL Final  . Hemoglobin 09/28/2013 11.3* 12.0 - 15.0 g/dL Final  . HCT 09/28/2013 32.8* 36.0 - 46.0 % Final  . MCV 09/28/2013 87.9  78.0 - 100.0 fL Final  . MCH 09/28/2013 30.3  26.0 - 34.0 pg Final  . MCHC 09/28/2013 34.5  30.0 - 36.0 g/dL Final  . RDW 09/28/2013 12.5  11.5 - 15.5 % Final  . Platelets 09/28/2013 185  150 - 400 K/uL Final  . Sodium 09/28/2013 139  137 - 147 mEq/L Final  . Potassium 09/28/2013 4.2  3.7 - 5.3 mEq/L Final  . Chloride 09/28/2013 101  96 - 112 mEq/L Final  . CO2 09/28/2013 24  19 - 32 mEq/L Final  . Glucose, Bld 09/28/2013 130* 70 - 99 mg/dL Final  . BUN 09/28/2013 8  6 - 23 mg/dL Final  . Creatinine, Ser 09/28/2013 0.45* 0.50 - 1.10 mg/dL Final  . Calcium 09/28/2013 8.6  8.4 - 10.5 mg/dL Final  . GFR calc non Af Amer 09/28/2013 >90  >90 mL/min Final  . GFR calc Af Amer 09/28/2013 >90  >90 mL/min Final   Comment: (NOTE)                          The eGFR has been calculated using the CKD EPI equation.  This calculation has not been validated in all clinical situations.                          eGFR's persistently <90 mL/min signify possible Chronic Kidney                          Disease.  . WBC 09/29/2013 10.0  4.0 - 10.5 K/uL Final  . RBC 09/29/2013 3.32* 3.87 - 5.11 MIL/uL Final  . Hemoglobin 09/29/2013 10.1* 12.0 - 15.0 g/dL Final  . HCT 09/29/2013 29.4* 36.0 - 46.0 % Final  . MCV 09/29/2013 88.6  78.0 - 100.0 fL Final  . MCH 09/29/2013 30.4  26.0 - 34.0 pg Final  . MCHC 09/29/2013 34.4  30.0 - 36.0 g/dL Final  . RDW 09/29/2013 13.0  11.5 - 15.5 % Final  . Platelets 09/29/2013 158  150 - 400 K/uL Final  . Sodium 09/29/2013 136* 137 - 147 mEq/L Final  . Potassium 09/29/2013 3.6* 3.7 - 5.3 mEq/L Final  . Chloride 09/29/2013 98  96 - 112  mEq/L Final  . CO2 09/29/2013 27  19 - 32 mEq/L Final  . Glucose, Bld 09/29/2013 131* 70 - 99 mg/dL Final  . BUN 09/29/2013 11  6 - 23 mg/dL Final  . Creatinine, Ser 09/29/2013 0.42* 0.50 - 1.10 mg/dL Final  . Calcium 09/29/2013 8.7  8.4 - 10.5 mg/dL Final  . GFR calc non Af Amer 09/29/2013 >90  >90 mL/min Final  . GFR calc Af Amer 09/29/2013 >90  >90 mL/min Final   Comment: (NOTE)                          The eGFR has been calculated using the CKD EPI equation.                          This calculation has not been validated in all clinical situations.                          eGFR's persistently <90 mL/min signify possible Chronic Kidney                          Disease.  Hospital Outpatient Visit on 09/22/2013  Component Date Value Ref Range Status  . aPTT 09/22/2013 27  24 - 37 seconds Final  . WBC 09/22/2013 6.4  4.0 - 10.5 K/uL Final  . RBC 09/22/2013 4.60  3.87 - 5.11 MIL/uL Final  . Hemoglobin 09/22/2013 13.8  12.0 - 15.0 g/dL Final  . HCT 09/22/2013 40.9  36.0 - 46.0 % Final  . MCV 09/22/2013 88.9  78.0 - 100.0 fL Final  . MCH 09/22/2013 30.0  26.0 - 34.0 pg Final  . MCHC 09/22/2013 33.7  30.0 - 36.0 g/dL Final  . RDW 09/22/2013 13.0  11.5 - 15.5 % Final  . Platelets 09/22/2013 196  150 - 400 K/uL Final  . Sodium 09/22/2013 138  137 - 147 mEq/L Final  . Potassium 09/22/2013 4.0  3.7 - 5.3 mEq/L Final  . Chloride 09/22/2013 100  96 - 112 mEq/L Final  . CO2 09/22/2013 27  19 - 32 mEq/L Final  . Glucose, Bld 09/22/2013 86  70 - 99 mg/dL Final  . BUN 09/22/2013 14  6 - 23 mg/dL Final  .  Creatinine, Ser 09/22/2013 0.43* 0.50 - 1.10 mg/dL Final  . Calcium 09/22/2013 9.6  8.4 - 10.5 mg/dL Final  . Total Protein 09/22/2013 6.6  6.0 - 8.3 g/dL Final  . Albumin 09/22/2013 3.6  3.5 - 5.2 g/dL Final  . AST 09/22/2013 14  0 - 37 U/L Final  . ALT 09/22/2013 16  0 - 35 U/L Final  . Alkaline Phosphatase 09/22/2013 85  39 - 117 U/L Final  . Total Bilirubin 09/22/2013 0.3  0.3 - 1.2  mg/dL Final  . GFR calc non Af Amer 09/22/2013 >90  >90 mL/min Final  . GFR calc Af Amer 09/22/2013 >90  >90 mL/min Final   Comment: (NOTE)                          The eGFR has been calculated using the CKD EPI equation.                          This calculation has not been validated in all clinical situations.                          eGFR's persistently <90 mL/min signify possible Chronic Kidney                          Disease.  Marland Kitchen Prothrombin Time 09/22/2013 12.4  11.6 - 15.2 seconds Final  . INR 09/22/2013 0.94  0.00 - 1.49 Final  . ABO/RH(D) 09/22/2013 O NEG   Final  . Antibody Screen 09/22/2013 NEG   Final  . Sample Expiration 09/22/2013 09/30/2013   Final  . Color, Urine 09/22/2013 YELLOW  YELLOW Final  . APPearance 09/22/2013 CLEAR  CLEAR Final  . Specific Gravity, Urine 09/22/2013 1.007  1.005 - 1.030 Final  . pH 09/22/2013 6.0  5.0 - 8.0 Final  . Glucose, UA 09/22/2013 NEGATIVE  NEGATIVE mg/dL Final  . Hgb urine dipstick 09/22/2013 NEGATIVE  NEGATIVE Final  . Bilirubin Urine 09/22/2013 NEGATIVE  NEGATIVE Final  . Ketones, ur 09/22/2013 NEGATIVE  NEGATIVE mg/dL Final  . Protein, ur 09/22/2013 NEGATIVE  NEGATIVE mg/dL Final  . Urobilinogen, UA 09/22/2013 0.2  0.0 - 1.0 mg/dL Final  . Nitrite 09/22/2013 NEGATIVE  NEGATIVE Final  . Leukocytes, UA 09/22/2013 TRACE* NEGATIVE Final  . MRSA, PCR 09/22/2013 NEGATIVE  NEGATIVE Final  . Staphylococcus aureus 09/22/2013 NEGATIVE  NEGATIVE Final   Comment:                                 The Xpert SA Assay (FDA                          approved for NASAL specimens                          in patients over 99 years of age),                          is one component of                          a comprehensive surveillance  program.  Test performance has                          been validated by [facility] for patients greater                          than or equal to 44 year old.                           It is not intended                          to diagnose infection nor to                          guide or monitor treatment.  . Squamous Epithelial / LPF 09/22/2013 RARE  RARE Final  . ABO/RH(D) 09/22/2013 O NEG   Final     X-Rays:Dg Chest 2 View  09/22/2013   CLINICAL DATA:  Preop for right knee arthroplasty  EXAM: CHEST  2 VIEW  COMPARISON:  None.  FINDINGS: Cardiomediastinal silhouette is unremarkable. Mild thoracic dextroscoliosis. Mild elevation of the right hemidiaphragm. No acute infiltrate or pleural effusion. Postcholecystectomy surgical clips are noted.  IMPRESSION: No active cardiopulmonary disease.   Electronically Signed   By: Lahoma Crocker M.D.   On: 09/22/2013 11:37    EKG:No orders found for this or any previous visit.   Hospital Course: NIKITHA MODE is a 80 y.o. who was admitted to [facility]. They were brought to the operating room on 09/27/2013 and underwent Procedure(s): RIGHT TOTAL KNEE ARTHROPLASTY.  Patient tolerated the procedure well and was later transferred to the recovery room and then to the orthopaedic floor for postoperative care.  They were given PO and IV analgesics for pain control following their surgery.  They were given 24 hours of postoperative antibiotics of  Anti-infectives   Start     Dose/Rate Route Frequency Ordered Stop   09/27/13 2300  vancomycin (VANCOCIN) IVPB 1000 mg/200 mL premix     1,000 mg 200 mL/hr over 60 Minutes Intravenous Every 12 hours 09/27/13 1442 09/27/13 2325   09/27/13 0821  vancomycin (VANCOCIN) IVPB 1000 mg/200 mL premix     1,000 mg 200 mL/hr over 60 Minutes Intravenous On call to O.R. 09/27/13 8937 09/27/13 1130     and started on DVT prophylaxis in the form of Xarelto.   PT and OT were ordered for total joint protocol.  Discharge planning consulted to help with postop disposition and equipment needs.  Social worker got involved to assist with placement of the patient.  Patient had a decent night on  the evening of surgery.  They started to get up OOB with therapy on day one. Hemovac drain was pulled without difficulty.  Continued to work with therapy into day two.  Dressing was changed on day two and the incision was healing well.   Patient was seen in rounds on day two and felt that she was ready to go to the SNF.  Getting insurance approval and then transfer to [place].   Discharge Medications: Prior to Admission medications   Medication Sig Start Date End  Date Taking? Authorizing Provider  acetaminophen (TYLENOL) 500 MG tablet Take 500 mg by mouth every 6 (six) hours as needed for mild pain or moderate pain.   Yes Historical Provider, MD  amLODipine-benazepril (LOTREL) 10-40 MG per capsule Take 1 capsule by mouth every morning.   Yes Historical Provider, MD  cetirizine (ZYRTEC) 10 MG tablet Take 10 mg by mouth daily.   Yes Historical Provider, MD  cholestyramine Lucrezia Starch) 4 G packet Take 4 g by mouth at bedtime.   Yes Historical Provider, MD  hydrochlorothiazide (HYDRODIURIL) 25 MG tablet Take 25 mg by mouth every morning.   Yes Historical Provider, MD  omeprazole (PRILOSEC) 20 MG capsule Take 20 mg by mouth daily.   Yes Historical Provider, MD  potassium chloride SA (K-DUR,KLOR-CON) 20 MEQ tablet Take 20 mEq by mouth daily.   Yes Historical Provider, MD  bisacodyl (DULCOLAX) 10 MG suppository Place 1 suppository (10 mg total) rectally daily as needed for moderate constipation. 09/29/13   Alexzandrew Perkins, PA-C  docusate sodium 100 MG CAPS Take 100 mg by mouth 2 (two) times daily. 09/29/13   Alexzandrew Dara Lords, PA-C  methocarbamol (ROBAXIN) 500 MG tablet Take 1 tablet (500 mg total) by mouth every 6 (six) hours as needed for muscle spasms. 09/29/13   Alexzandrew Dara Lords, PA-C  metoCLOPramide (REGLAN) 5 MG tablet Take 1-2 tablets (5-10 mg total) by mouth every 8 (eight) hours as needed for nausea (if ondansetron (ZOFRAN) ineffective.). 09/29/13   Alexzandrew Perkins, PA-C  ondansetron  (ZOFRAN) 4 MG tablet Take 1 tablet (4 mg total) by mouth every 6 (six) hours as needed for nausea. 09/29/13   Alexzandrew Perkins, PA-C  oxyCODONE (OXY IR/ROXICODONE) 5 MG immediate release tablet Take 1-2 tablets (5-10 mg total) by mouth every 3 (three) hours as needed for moderate pain, severe pain or breakthrough pain. 09/29/13   Alexzandrew Perkins, PA-C  polyethylene glycol (MIRALAX / GLYCOLAX) packet Take 17 g by mouth daily as needed for mild constipation. 09/29/13   Alexzandrew Dara Lords, PA-C  rivaroxaban (XARELTO) 10 MG TABS tablet Take 1 tablet (10 mg total) by mouth daily with breakfast. Take Xarelto for two and a half more weeks, then discontinue Xarelto. Once the patient has completed the Xarelto, they may resume the 81 mg Aspirin. 09/29/13   Alexzandrew Perkins, PA-C  traMADol (ULTRAM) 50 MG tablet Take 1-2 tablets (50-100 mg total) by mouth every 6 (six) hours as needed for moderate pain (mild to moderate pain). 09/29/13   Alexzandrew Dara Lords, PA-C    Discharge to SNF  Diet - Cardiac diet  Follow up - in 2 weeks  Activity - WBAT  Disposition - Skilled nursing facility  Condition Upon Discharge - Good  D/C Meds - See DC Summary  DVT Prophylaxis - Xarelto   Discharge Orders   Future Orders Complete By Expires   Call MD / Call 911  As directed    Comments:     If you experience chest pain or shortness of breath, CALL 911 and be transported to the hospital emergency room.  If you develope a fever above 101 F, pus (white drainage) or increased drainage or redness at the wound, or calf pain, call your surgeon's office.   Change dressing  As directed    Comments:     Change dressing daily with sterile 4 x 4 inch gauze dressing and apply TED hose. Do not submerge the incision under water.   Constipation Prevention  As directed    Comments:  Drink plenty of fluids.  Prune juice may be helpful.  You may use a stool softener, such as Colace (over the counter) 100 mg twice a day.  Use MiraLax  (over the counter) for constipation as needed.   Diet - low sodium heart healthy  As directed    Discharge instructions  As directed    Comments:     Pick up stool softner and laxative for home. Do not submerge incision under water. May shower. Continue to use ice for pain and swelling from surgery. Take Xarelto for two and a half more weeks, then discontinue Xarelto. Once the patient has completed the Xarelto, they may resume the 81 mg Aspirin.  When discharged from the skilled rehab facility, please have the facility set up the patient's [place] prior to being released.  Also provide the patient with their medications at time of release from the facility to include their pain medication, the muscle relaxants, and their blood thinner medication.  If the patient is still at the rehab facility at time of follow up appointment, please also assist the patient in arranging follow up appointment in our office and any transportation needs.   Do not put a pillow under the knee. Place it under the heel.  As directed    Do not sit on low chairs, stoools or toilet seats, as it may be difficult to get up from low surfaces  As directed    Driving restrictions  As directed    Comments:     No driving until released by the physician.   Increase activity slowly as tolerated  As directed    Lifting restrictions  As directed    Comments:     No lifting until released by the physician.   Patient may shower  As directed    Comments:     You may shower without a dressing once there is no drainage.  Do not wash over the wound.  If drainage remains, do not shower until drainage stops.   TED hose  As directed    Comments:     Use stockings (TED hose) for 3 weeks on both leg(s).  You may remove them at night for sleeping.   Weight bearing as tolerated  As directed    Questions:     Laterality:     Extremity:         Medication List    STOP taking these medications       aspirin EC  81 MG tablet     B-complex with vitamin C tablet     multivitamin with minerals Tabs tablet     omega-3 acid ethyl esters 1 G capsule  Commonly known as:  LOVAZA      TAKE these medications       acetaminophen 500 MG tablet  Commonly known as:  TYLENOL  Take 500 mg by mouth every 6 (six) hours as needed for mild pain or moderate pain.     amLODipine-benazepril 10-40 MG per capsule  Commonly known as:  LOTREL  Take 1 capsule by mouth every morning.     bisacodyl 10 MG suppository  Commonly known as:  DULCOLAX  Place 1 suppository (10 mg total) rectally daily as needed for moderate constipation.     cetirizine 10 MG tablet  Commonly known as:  ZYRTEC  Take 10 mg by mouth daily.     cholestyramine 4 G packet  Commonly known as:  QUESTRAN  Take 4  g by mouth at bedtime.     DSS 100 MG Caps  Take 100 mg by mouth 2 (two) times daily.     hydrochlorothiazide 25 MG tablet  Commonly known as:  HYDRODIURIL  Take 25 mg by mouth every morning.     methocarbamol 500 MG tablet  Commonly known as:  ROBAXIN  Take 1 tablet (500 mg total) by mouth every 6 (six) hours as needed for muscle spasms.     metoCLOPramide 5 MG tablet  Commonly known as:  REGLAN  Take 1-2 tablets (5-10 mg total) by mouth every 8 (eight) hours as needed for nausea (if ondansetron (ZOFRAN) ineffective.).     omeprazole 20 MG capsule  Commonly known as:  PRILOSEC  Take 20 mg by mouth daily.     ondansetron 4 MG tablet  Commonly known as:  ZOFRAN  Take 1 tablet (4 mg total) by mouth every 6 (six) hours as needed for nausea.     oxyCODONE 5 MG immediate release tablet  Commonly known as:  Oxy IR/ROXICODONE  Take 1-2 tablets (5-10 mg total) by mouth every 3 (three) hours as needed for moderate pain, severe pain or breakthrough pain.     polyethylene glycol packet  Commonly known as:  MIRALAX / GLYCOLAX  Take 17 g by mouth daily as needed for mild constipation.     potassium chloride SA 20 MEQ tablet    Commonly known as:  K-DUR,KLOR-CON  Take 20 mEq by mouth daily.     rivaroxaban 10 MG Tabs tablet  Commonly known as:  XARELTO  - Take 1 tablet (10 mg total) by mouth daily with breakfast. Take Xarelto for two and a half more weeks, then discontinue Xarelto.  - Once the patient has completed the Xarelto, they may resume the 81 mg Aspirin.     traMADol 50 MG tablet  Commonly known as:  ULTRAM  Take 1-2 tablets (50-100 mg total) by mouth every 6 (six) hours as needed for moderate pain (mild to moderate pain).           Follow-up Information   Follow up with Gearlean Alf, MD. Schedule an appointment as soon as possible for a visit in 2 weeks. (Please contact the office for appointment and time.)    Specialty:  Orthopedic Surgery   Contact information:   [address] [place] [address] [phone]       Signed: Mickel Crow 09/29/2013, 1:58 PM

## 2013-09-29 NOTE — Progress Notes (Signed)
   Subjective: 2 Days Post-Op Procedure(s) (LRB): RIGHT TOTAL KNEE ARTHROPLASTY (Right) Patient reports pain as mild.   Patient seen in rounds with Dr. Wynelle Dominguez. Patient is well, and has had no acute complaints or problems Patient is ready to go to SNF  Objective: Vital signs in last 24 hours: Temp:  [98.1 F (36.7 C)-98.3 F (36.8 C)] 98.1 F (36.7 C) (03/04 0600) Pulse Rate:  [64-76] 64 (03/04 0600) Resp:  [16-18] 16 (03/04 0600) BP: (127-145)/(55-70) 127/66 mmHg (03/04 0600) SpO2:  [95 %-99 %] 95 % (03/04 0600)  Intake/Output from previous day:  Intake/Output Summary (Last 24 hours) at 09/29/13 1349 Last data filed at 09/29/13 1100  Gross per 24 hour  Intake 855.33 ml  Output   3401 ml  Net -2545.67 ml    Intake/Output this shift: Total I/O In: -  Out: 501 [Urine:500; Stool:1]  Labs:  Recent Labs  09/28/13 0445 09/29/13 0408  HGB 11.3* 10.1*    Recent Labs  09/28/13 0445 09/29/13 0408  WBC 10.5 10.0  RBC 3.73* 3.32*  HCT 32.8* 29.4*  PLT 185 158    Recent Labs  09/28/13 0445 09/29/13 0408  NA 139 136*  K 4.2 3.6*  CL 101 98  CO2 24 27  BUN 8 11  CREATININE 0.45* 0.42*  GLUCOSE 130* 131*  CALCIUM 8.6 8.7   No results found for this basename: LABPT, INR,  in the last 72 hours  EXAM: General - Patient is Alert, Appropriate and Oriented Extremity - Neurovascular intact Sensation intact distally Dorsiflexion/Plantar flexion intact Incision - clean, dry, no drainage, healing Motor Function - intact, moving foot and toes well on exam.   Assessment/Plan: 2 Days Post-Op Procedure(s) (LRB): RIGHT TOTAL KNEE ARTHROPLASTY (Right) Procedure(s) (LRB): RIGHT TOTAL KNEE ARTHROPLASTY (Right) Past Medical History  Diagnosis Date  . Hypertension   . GERD (gastroesophageal reflux disease)   . Arthritis   . TMJ (dislocation of temporomandibular joint)   . Complication of anesthesia 1989    mouth joint trouble after hysterctomy, told by dentist to  haver anesthesia use bite block to keep mouth open same amount all over  . Family history of anesthesia complication     brother low bp, heart problems, ileus  . IBS (irritable bowel syndrome)   . Hyperlipidemia   . Diverticulosis   . Osteopenia   . DJD (degenerative joint disease)   . Chronic lower back pain    Principal Problem:   OA (osteoarthritis) of knee Active Problems:   Unspecified essential hypertension   GERD (gastroesophageal reflux disease)   Irritable bowel syndrome   Other and unspecified hyperlipidemia   Diverticulosis of colon (without mention of hemorrhage)  Estimated body mass index is 26.4 kg/(m^2) as calculated from the following:   Height as of this encounter: 5\' 2"  (1.575 m).   Weight as of this encounter: 65.499 kg (144 lb 6.4 oz). Up with therapy Discharge to SNF Diet - Cardiac diet Follow up - in 2 weeks Activity - WBAT Disposition - Skilled nursing facility Condition Upon Discharge - Good D/C Meds - See DC Summary DVT Prophylaxis - Xarelto  Aziya Arena 09/29/2013, 1:49 PM

## 2013-09-29 NOTE — Progress Notes (Signed)
1600 report called to cora at [facility] Carter Kitten RN

## 2013-09-29 NOTE — Progress Notes (Signed)
Physical Therapy Treatment Patient Details Name: Pamela Dominguez MRN: [id number] DOB: 1933-06-29 Today's Date: 09/29/2013 Time: 2620-3559 PT Time Calculation (min): 14 min  PT Assessment / Plan / Recommendation  History of Present Illness RTKA   PT Comments   POD # 2 pt progressing slowly and will need ST Rehab at SNF.  Follow Up Recommendations  SNF     Does the patient have the potential to tolerate intense rehabilitation     Barriers to Discharge        Equipment Recommendations       Recommendations for Other Services    Frequency 7X/week   Progress towards PT Goals Progress towards PT goals: Progressing toward goals  Plan      Precautions / Restrictions Precautions Precautions: Fall;Knee Precaution Comments: Instructed pt on KI use for amb Required Braces or Orthoses: Knee Immobilizer - Right Knee Immobilizer - Right: Discontinue once straight leg raise with < 10 degree lag Restrictions Weight Bearing Restrictions: No Other Position/Activity Restrictions: WBAT   Pertinent Vitals/Pain C/o 4/10 pain "soreness" Pain meds requested    Mobility  Bed Mobility General bed mobility comments: Pt OOB in bathroom Transfers Overall transfer level: Needs assistance Equipment used: Rolling walker (2 wheeled) Transfers: Sit to/from Stand Sit to Stand: Min assist General transfer comment: increased time and 25% VC's on proper hand placement  Ambulation/Gait Ambulation/Gait assistance: Min assist Ambulation Distance (Feet): 32 Feet Assistive device: Rolling walker (2 wheeled) Gait Pattern/deviations: Step-to pattern;Decreased stance time - right Gait velocity: decreased General Gait Details: increased time and 25% VC's safety with turns and proper walker to self distance     PT Goals (current goals can now be found in the care plan section)    Visit Information  Last PT Received On: 09/29/13 Assistance Needed: +1 History of Present Illness: RTKA    Subjective Data     Cognition       Balance     End of Session PT - End of Session Equipment Utilized During Treatment: Right knee immobilizer;Gait belt Activity Tolerance: Patient limited by fatigue;Patient limited by pain Patient left: in chair;with call bell/phone within reach   Rica Koyanagi  PTA [facility]  Acute  Rehab Pager      [phone]

## 2013-09-30 NOTE — Progress Notes (Signed)
Clinical Social Work Department CLINICAL SOCIAL WORK PLACEMENT NOTE 09/30/2013  Patient:  Pamela Dominguez, Pamela Dominguez  Account Number:  [id number] Admit date:  09/27/2013  Clinical Social Worker:  Werner Lean, LCSW  Date/time:  09/28/2013 12:22 PM  Clinical Social Work is seeking post-discharge placement for this patient at the following level of care:   SKILLED NURSING   (*CSW will update this form in Epic as items are completed)     Patient/family provided with [place] Department of Clinical Social Work's list of facilities offering this level of care within the geographic area requested by the patient (or if unable, by the patient's family).  09/28/2013  Patient/family informed of their freedom to choose among providers that offer the needed level of care, that participate in Medicare, Medicaid or managed care program needed by the patient, have an available bed and are willing to accept the patient.    Patient/family informed of [facility]' ownership interest in [facility], as well as of the fact that they are under no obligation to receive care at this facility.  PASARR submitted to EDS on 09/28/2013 PASARR number received from EDS on 09/28/2013  FL2 transmitted to all facilities in geographic area requested by pt/family on  09/28/2013 FL2 transmitted to all facilities within larger geographic area on   Patient informed that his/her managed care company has contracts with or will negotiate with  certain facilities, including the following:     Patient/family informed of bed offers received:  09/28/2013 Patient chooses bed at [facility] Physician recommends and patient chooses bed at    Patient to be transferred to [place]  on  09/29/2013 Patient to be transferred to facility by [facility]  The following physician request were entered in Epic:   Additional Comments: [employer] authorized SNF placement and Ambulance transport.  Werner Lean LCSW  [phone]

## 2015-09-18 DIAGNOSIS — M8949 Other hypertrophic osteoarthropathy, multiple sites: Secondary | ICD-10-CM

## 2015-09-18 DIAGNOSIS — I10 Essential (primary) hypertension: Secondary | ICD-10-CM

## 2015-09-18 DIAGNOSIS — J948 Other specified pleural conditions: Secondary | ICD-10-CM | POA: Diagnosis not present

## 2015-09-18 DIAGNOSIS — K582 Mixed irritable bowel syndrome: Secondary | ICD-10-CM

## 2015-09-18 DIAGNOSIS — I110 Hypertensive heart disease with heart failure: Secondary | ICD-10-CM | POA: Insufficient documentation

## 2015-09-18 DIAGNOSIS — M159 Polyosteoarthritis, unspecified: Secondary | ICD-10-CM

## 2015-09-18 DIAGNOSIS — E785 Hyperlipidemia, unspecified: Secondary | ICD-10-CM | POA: Insufficient documentation

## 2015-09-18 DIAGNOSIS — M150 Primary generalized (osteo)arthritis: Secondary | ICD-10-CM

## 2015-09-18 DIAGNOSIS — Z79899 Other long term (current) drug therapy: Secondary | ICD-10-CM

## 2015-09-18 DIAGNOSIS — M8580 Other specified disorders of bone density and structure, unspecified site: Secondary | ICD-10-CM | POA: Insufficient documentation

## 2015-09-18 DIAGNOSIS — R079 Chest pain, unspecified: Secondary | ICD-10-CM | POA: Diagnosis not present

## 2015-09-18 DIAGNOSIS — S299XXA Unspecified injury of thorax, initial encounter: Secondary | ICD-10-CM | POA: Diagnosis not present

## 2015-09-18 DIAGNOSIS — R0789 Other chest pain: Secondary | ICD-10-CM | POA: Diagnosis not present

## 2015-09-18 HISTORY — DX: Other long term (current) drug therapy: Z79.899

## 2015-09-18 HISTORY — DX: Mixed irritable bowel syndrome: K58.2

## 2015-09-18 HISTORY — DX: Hyperlipidemia, unspecified: E78.5

## 2015-09-18 HISTORY — DX: Primary generalized (osteo)arthritis: M15.0

## 2015-09-18 HISTORY — DX: Other hypertrophic osteoarthropathy, multiple sites: M89.49

## 2015-09-18 HISTORY — DX: Polyosteoarthritis, unspecified: M15.9

## 2015-09-18 HISTORY — DX: Essential (primary) hypertension: I10

## 2015-09-27 DIAGNOSIS — Z79899 Other long term (current) drug therapy: Secondary | ICD-10-CM | POA: Diagnosis not present

## 2015-09-27 DIAGNOSIS — I10 Essential (primary) hypertension: Secondary | ICD-10-CM | POA: Diagnosis not present

## 2015-09-27 DIAGNOSIS — E785 Hyperlipidemia, unspecified: Secondary | ICD-10-CM | POA: Diagnosis not present

## 2015-09-27 DIAGNOSIS — K449 Diaphragmatic hernia without obstruction or gangrene: Secondary | ICD-10-CM | POA: Diagnosis not present

## 2015-09-27 DIAGNOSIS — K219 Gastro-esophageal reflux disease without esophagitis: Secondary | ICD-10-CM | POA: Diagnosis not present

## 2015-09-27 DIAGNOSIS — K580 Irritable bowel syndrome with diarrhea: Secondary | ICD-10-CM | POA: Diagnosis not present

## 2015-09-27 DIAGNOSIS — M150 Primary generalized (osteo)arthritis: Secondary | ICD-10-CM | POA: Diagnosis not present

## 2015-09-27 DIAGNOSIS — M8580 Other specified disorders of bone density and structure, unspecified site: Secondary | ICD-10-CM | POA: Diagnosis not present

## 2015-09-27 DIAGNOSIS — Z0000 Encounter for general adult medical examination without abnormal findings: Secondary | ICD-10-CM | POA: Diagnosis not present

## 2015-10-16 DIAGNOSIS — Z1231 Encounter for screening mammogram for malignant neoplasm of breast: Secondary | ICD-10-CM | POA: Diagnosis not present

## 2015-11-03 DIAGNOSIS — J0140 Acute pansinusitis, unspecified: Secondary | ICD-10-CM | POA: Diagnosis not present

## 2015-12-08 DIAGNOSIS — J0141 Acute recurrent pansinusitis: Secondary | ICD-10-CM | POA: Diagnosis not present

## 2015-12-21 DIAGNOSIS — M25551 Pain in right hip: Secondary | ICD-10-CM | POA: Diagnosis not present

## 2015-12-21 DIAGNOSIS — S2231XA Fracture of one rib, right side, initial encounter for closed fracture: Secondary | ICD-10-CM | POA: Diagnosis not present

## 2015-12-21 DIAGNOSIS — N390 Urinary tract infection, site not specified: Secondary | ICD-10-CM | POA: Diagnosis not present

## 2015-12-21 DIAGNOSIS — J40 Bronchitis, not specified as acute or chronic: Secondary | ICD-10-CM | POA: Diagnosis not present

## 2015-12-21 DIAGNOSIS — M419 Scoliosis, unspecified: Secondary | ICD-10-CM | POA: Diagnosis not present

## 2015-12-21 DIAGNOSIS — R079 Chest pain, unspecified: Secondary | ICD-10-CM | POA: Diagnosis not present

## 2016-03-15 DIAGNOSIS — L821 Other seborrheic keratosis: Secondary | ICD-10-CM | POA: Diagnosis not present

## 2016-03-15 DIAGNOSIS — L820 Inflamed seborrheic keratosis: Secondary | ICD-10-CM | POA: Diagnosis not present

## 2016-03-15 DIAGNOSIS — L578 Other skin changes due to chronic exposure to nonionizing radiation: Secondary | ICD-10-CM | POA: Diagnosis not present

## 2016-03-20 DIAGNOSIS — M4126 Other idiopathic scoliosis, lumbar region: Secondary | ICD-10-CM | POA: Diagnosis not present

## 2016-03-20 DIAGNOSIS — R5383 Other fatigue: Secondary | ICD-10-CM | POA: Diagnosis not present

## 2016-03-20 DIAGNOSIS — R419 Unspecified symptoms and signs involving cognitive functions and awareness: Secondary | ICD-10-CM | POA: Diagnosis not present

## 2016-03-20 DIAGNOSIS — N390 Urinary tract infection, site not specified: Secondary | ICD-10-CM | POA: Diagnosis not present

## 2016-03-20 DIAGNOSIS — R5381 Other malaise: Secondary | ICD-10-CM | POA: Diagnosis not present

## 2016-04-08 DIAGNOSIS — K582 Mixed irritable bowel syndrome: Secondary | ICD-10-CM | POA: Diagnosis not present

## 2016-04-08 DIAGNOSIS — R5383 Other fatigue: Secondary | ICD-10-CM | POA: Diagnosis not present

## 2016-04-08 DIAGNOSIS — K449 Diaphragmatic hernia without obstruction or gangrene: Secondary | ICD-10-CM | POA: Diagnosis not present

## 2016-04-08 DIAGNOSIS — K219 Gastro-esophageal reflux disease without esophagitis: Secondary | ICD-10-CM | POA: Diagnosis not present

## 2016-04-08 DIAGNOSIS — E785 Hyperlipidemia, unspecified: Secondary | ICD-10-CM | POA: Diagnosis not present

## 2016-04-08 DIAGNOSIS — Z79899 Other long term (current) drug therapy: Secondary | ICD-10-CM | POA: Diagnosis not present

## 2016-04-08 DIAGNOSIS — Z23 Encounter for immunization: Secondary | ICD-10-CM | POA: Diagnosis not present

## 2016-04-08 DIAGNOSIS — I10 Essential (primary) hypertension: Secondary | ICD-10-CM | POA: Diagnosis not present

## 2016-04-08 DIAGNOSIS — N390 Urinary tract infection, site not specified: Secondary | ICD-10-CM | POA: Diagnosis not present

## 2016-04-08 DIAGNOSIS — M150 Primary generalized (osteo)arthritis: Secondary | ICD-10-CM | POA: Diagnosis not present

## 2016-04-08 DIAGNOSIS — R5381 Other malaise: Secondary | ICD-10-CM | POA: Diagnosis not present

## 2016-05-02 DIAGNOSIS — H269 Unspecified cataract: Secondary | ICD-10-CM | POA: Diagnosis not present

## 2016-05-16 DIAGNOSIS — M1712 Unilateral primary osteoarthritis, left knee: Secondary | ICD-10-CM | POA: Diagnosis not present

## 2016-05-23 DIAGNOSIS — M1712 Unilateral primary osteoarthritis, left knee: Secondary | ICD-10-CM | POA: Diagnosis not present

## 2016-05-31 DIAGNOSIS — M1712 Unilateral primary osteoarthritis, left knee: Secondary | ICD-10-CM | POA: Diagnosis not present

## 2016-06-25 DIAGNOSIS — M5136 Other intervertebral disc degeneration, lumbar region: Secondary | ICD-10-CM | POA: Diagnosis not present

## 2016-08-21 DIAGNOSIS — J101 Influenza due to other identified influenza virus with other respiratory manifestations: Secondary | ICD-10-CM | POA: Diagnosis not present

## 2016-08-21 DIAGNOSIS — I10 Essential (primary) hypertension: Secondary | ICD-10-CM | POA: Diagnosis not present

## 2016-09-04 DIAGNOSIS — J0100 Acute maxillary sinusitis, unspecified: Secondary | ICD-10-CM | POA: Diagnosis not present

## 2016-09-04 DIAGNOSIS — I208 Other forms of angina pectoris: Secondary | ICD-10-CM | POA: Diagnosis not present

## 2016-09-04 DIAGNOSIS — I10 Essential (primary) hypertension: Secondary | ICD-10-CM | POA: Diagnosis not present

## 2016-09-04 DIAGNOSIS — R5383 Other fatigue: Secondary | ICD-10-CM | POA: Diagnosis not present

## 2016-09-04 DIAGNOSIS — R5381 Other malaise: Secondary | ICD-10-CM | POA: Diagnosis not present

## 2016-09-05 DIAGNOSIS — I517 Cardiomegaly: Secondary | ICD-10-CM | POA: Diagnosis not present

## 2016-09-26 DIAGNOSIS — R0609 Other forms of dyspnea: Secondary | ICD-10-CM | POA: Diagnosis not present

## 2016-09-26 DIAGNOSIS — I10 Essential (primary) hypertension: Secondary | ICD-10-CM | POA: Diagnosis not present

## 2016-09-26 DIAGNOSIS — R079 Chest pain, unspecified: Secondary | ICD-10-CM | POA: Diagnosis not present

## 2016-09-28 DIAGNOSIS — M79642 Pain in left hand: Secondary | ICD-10-CM | POA: Diagnosis not present

## 2016-09-28 DIAGNOSIS — S60222A Contusion of left hand, initial encounter: Secondary | ICD-10-CM | POA: Diagnosis not present

## 2016-09-28 DIAGNOSIS — S6992XA Unspecified injury of left wrist, hand and finger(s), initial encounter: Secondary | ICD-10-CM | POA: Diagnosis not present

## 2016-09-28 DIAGNOSIS — W010XXA Fall on same level from slipping, tripping and stumbling without subsequent striking against object, initial encounter: Secondary | ICD-10-CM | POA: Diagnosis not present

## 2016-09-28 DIAGNOSIS — M7989 Other specified soft tissue disorders: Secondary | ICD-10-CM | POA: Diagnosis not present

## 2016-10-07 DIAGNOSIS — E782 Mixed hyperlipidemia: Secondary | ICD-10-CM | POA: Diagnosis not present

## 2016-10-07 DIAGNOSIS — M150 Primary generalized (osteo)arthritis: Secondary | ICD-10-CM | POA: Diagnosis not present

## 2016-10-07 DIAGNOSIS — K449 Diaphragmatic hernia without obstruction or gangrene: Secondary | ICD-10-CM | POA: Diagnosis not present

## 2016-10-07 DIAGNOSIS — K582 Mixed irritable bowel syndrome: Secondary | ICD-10-CM | POA: Diagnosis not present

## 2016-10-07 DIAGNOSIS — Z79899 Other long term (current) drug therapy: Secondary | ICD-10-CM | POA: Diagnosis not present

## 2016-10-07 DIAGNOSIS — Z0000 Encounter for general adult medical examination without abnormal findings: Secondary | ICD-10-CM | POA: Diagnosis not present

## 2016-10-07 DIAGNOSIS — K219 Gastro-esophageal reflux disease without esophagitis: Secondary | ICD-10-CM | POA: Diagnosis not present

## 2016-10-07 DIAGNOSIS — R5381 Other malaise: Secondary | ICD-10-CM | POA: Diagnosis not present

## 2016-10-07 DIAGNOSIS — I10 Essential (primary) hypertension: Secondary | ICD-10-CM | POA: Diagnosis not present

## 2016-10-07 DIAGNOSIS — M8589 Other specified disorders of bone density and structure, multiple sites: Secondary | ICD-10-CM | POA: Diagnosis not present

## 2016-10-07 DIAGNOSIS — R5383 Other fatigue: Secondary | ICD-10-CM | POA: Diagnosis not present

## 2016-10-14 DIAGNOSIS — I10 Essential (primary) hypertension: Secondary | ICD-10-CM | POA: Diagnosis not present

## 2016-10-14 DIAGNOSIS — R079 Chest pain, unspecified: Secondary | ICD-10-CM | POA: Diagnosis not present

## 2016-10-14 DIAGNOSIS — R0609 Other forms of dyspnea: Secondary | ICD-10-CM | POA: Diagnosis not present

## 2016-10-17 DIAGNOSIS — Z1231 Encounter for screening mammogram for malignant neoplasm of breast: Secondary | ICD-10-CM | POA: Diagnosis not present

## 2016-11-04 DIAGNOSIS — M5136 Other intervertebral disc degeneration, lumbar region: Secondary | ICD-10-CM | POA: Diagnosis not present

## 2016-11-04 DIAGNOSIS — M419 Scoliosis, unspecified: Secondary | ICD-10-CM | POA: Diagnosis not present

## 2016-11-07 DIAGNOSIS — M48061 Spinal stenosis, lumbar region without neurogenic claudication: Secondary | ICD-10-CM | POA: Diagnosis not present

## 2016-11-07 DIAGNOSIS — M5136 Other intervertebral disc degeneration, lumbar region: Secondary | ICD-10-CM | POA: Diagnosis not present

## 2016-11-14 DIAGNOSIS — M48061 Spinal stenosis, lumbar region without neurogenic claudication: Secondary | ICD-10-CM | POA: Diagnosis not present

## 2016-11-14 DIAGNOSIS — M5136 Other intervertebral disc degeneration, lumbar region: Secondary | ICD-10-CM | POA: Diagnosis not present

## 2016-11-18 DIAGNOSIS — K5792 Diverticulitis of intestine, part unspecified, without perforation or abscess without bleeding: Secondary | ICD-10-CM | POA: Diagnosis not present

## 2016-11-18 DIAGNOSIS — R1030 Lower abdominal pain, unspecified: Secondary | ICD-10-CM | POA: Diagnosis not present

## 2016-11-18 DIAGNOSIS — R600 Localized edema: Secondary | ICD-10-CM | POA: Diagnosis not present

## 2016-11-18 DIAGNOSIS — N390 Urinary tract infection, site not specified: Secondary | ICD-10-CM | POA: Diagnosis not present

## 2016-11-18 DIAGNOSIS — I10 Essential (primary) hypertension: Secondary | ICD-10-CM | POA: Diagnosis not present

## 2016-12-18 DIAGNOSIS — M5136 Other intervertebral disc degeneration, lumbar region: Secondary | ICD-10-CM | POA: Diagnosis not present

## 2017-01-30 DIAGNOSIS — M1712 Unilateral primary osteoarthritis, left knee: Secondary | ICD-10-CM | POA: Diagnosis not present

## 2017-02-05 DIAGNOSIS — M1712 Unilateral primary osteoarthritis, left knee: Secondary | ICD-10-CM | POA: Diagnosis not present

## 2017-02-10 DIAGNOSIS — M150 Primary generalized (osteo)arthritis: Secondary | ICD-10-CM | POA: Diagnosis not present

## 2017-02-10 DIAGNOSIS — K582 Mixed irritable bowel syndrome: Secondary | ICD-10-CM | POA: Diagnosis not present

## 2017-02-10 DIAGNOSIS — R5383 Other fatigue: Secondary | ICD-10-CM | POA: Diagnosis not present

## 2017-02-10 DIAGNOSIS — E782 Mixed hyperlipidemia: Secondary | ICD-10-CM | POA: Diagnosis not present

## 2017-02-10 DIAGNOSIS — R5381 Other malaise: Secondary | ICD-10-CM | POA: Diagnosis not present

## 2017-02-10 DIAGNOSIS — N3000 Acute cystitis without hematuria: Secondary | ICD-10-CM | POA: Diagnosis not present

## 2017-02-10 DIAGNOSIS — Z79899 Other long term (current) drug therapy: Secondary | ICD-10-CM | POA: Diagnosis not present

## 2017-02-10 DIAGNOSIS — I10 Essential (primary) hypertension: Secondary | ICD-10-CM | POA: Diagnosis not present

## 2017-02-13 DIAGNOSIS — M1712 Unilateral primary osteoarthritis, left knee: Secondary | ICD-10-CM | POA: Diagnosis not present

## 2017-03-17 DIAGNOSIS — L578 Other skin changes due to chronic exposure to nonionizing radiation: Secondary | ICD-10-CM | POA: Diagnosis not present

## 2017-03-17 DIAGNOSIS — L821 Other seborrheic keratosis: Secondary | ICD-10-CM | POA: Diagnosis not present

## 2017-03-17 DIAGNOSIS — D1801 Hemangioma of skin and subcutaneous tissue: Secondary | ICD-10-CM | POA: Diagnosis not present

## 2017-03-17 DIAGNOSIS — B079 Viral wart, unspecified: Secondary | ICD-10-CM | POA: Diagnosis not present

## 2017-03-27 DIAGNOSIS — K582 Mixed irritable bowel syndrome: Secondary | ICD-10-CM | POA: Diagnosis not present

## 2017-03-27 DIAGNOSIS — R5383 Other fatigue: Secondary | ICD-10-CM | POA: Diagnosis not present

## 2017-03-27 DIAGNOSIS — I10 Essential (primary) hypertension: Secondary | ICD-10-CM | POA: Diagnosis not present

## 2017-03-27 DIAGNOSIS — R5381 Other malaise: Secondary | ICD-10-CM | POA: Diagnosis not present

## 2017-04-09 DIAGNOSIS — J0100 Acute maxillary sinusitis, unspecified: Secondary | ICD-10-CM | POA: Diagnosis not present

## 2017-04-09 DIAGNOSIS — J209 Acute bronchitis, unspecified: Secondary | ICD-10-CM | POA: Diagnosis not present

## 2017-04-09 DIAGNOSIS — I10 Essential (primary) hypertension: Secondary | ICD-10-CM | POA: Diagnosis not present

## 2017-04-16 DIAGNOSIS — J40 Bronchitis, not specified as acute or chronic: Secondary | ICD-10-CM | POA: Diagnosis not present

## 2017-04-16 DIAGNOSIS — Z79899 Other long term (current) drug therapy: Secondary | ICD-10-CM | POA: Diagnosis not present

## 2017-04-16 DIAGNOSIS — I10 Essential (primary) hypertension: Secondary | ICD-10-CM | POA: Diagnosis not present

## 2017-04-30 DIAGNOSIS — H2513 Age-related nuclear cataract, bilateral: Secondary | ICD-10-CM | POA: Diagnosis not present

## 2017-04-30 DIAGNOSIS — H524 Presbyopia: Secondary | ICD-10-CM | POA: Diagnosis not present

## 2017-05-19 DIAGNOSIS — R609 Edema, unspecified: Secondary | ICD-10-CM

## 2017-05-19 DIAGNOSIS — I10 Essential (primary) hypertension: Secondary | ICD-10-CM | POA: Diagnosis not present

## 2017-05-19 DIAGNOSIS — Z79899 Other long term (current) drug therapy: Secondary | ICD-10-CM | POA: Diagnosis not present

## 2017-05-19 DIAGNOSIS — Z23 Encounter for immunization: Secondary | ICD-10-CM | POA: Diagnosis not present

## 2017-05-19 HISTORY — DX: Edema, unspecified: R60.9

## 2017-08-28 DIAGNOSIS — K5732 Diverticulitis of large intestine without perforation or abscess without bleeding: Secondary | ICD-10-CM | POA: Diagnosis not present

## 2017-08-28 DIAGNOSIS — R10819 Abdominal tenderness, unspecified site: Secondary | ICD-10-CM | POA: Diagnosis not present

## 2017-08-28 DIAGNOSIS — K769 Liver disease, unspecified: Secondary | ICD-10-CM | POA: Diagnosis not present

## 2017-08-28 DIAGNOSIS — R109 Unspecified abdominal pain: Secondary | ICD-10-CM | POA: Diagnosis not present

## 2017-08-28 DIAGNOSIS — I700 Atherosclerosis of aorta: Secondary | ICD-10-CM | POA: Diagnosis not present

## 2017-08-28 DIAGNOSIS — Z8719 Personal history of other diseases of the digestive system: Secondary | ICD-10-CM

## 2017-08-28 DIAGNOSIS — R112 Nausea with vomiting, unspecified: Secondary | ICD-10-CM | POA: Diagnosis not present

## 2017-08-28 DIAGNOSIS — R1031 Right lower quadrant pain: Secondary | ICD-10-CM | POA: Diagnosis not present

## 2017-08-28 HISTORY — DX: Personal history of other diseases of the digestive system: Z87.19

## 2017-09-03 DIAGNOSIS — Z8719 Personal history of other diseases of the digestive system: Secondary | ICD-10-CM | POA: Diagnosis not present

## 2017-09-03 DIAGNOSIS — Z09 Encounter for follow-up examination after completed treatment for conditions other than malignant neoplasm: Secondary | ICD-10-CM | POA: Diagnosis not present

## 2017-09-03 DIAGNOSIS — K582 Mixed irritable bowel syndrome: Secondary | ICD-10-CM | POA: Diagnosis not present

## 2017-09-03 DIAGNOSIS — K5792 Diverticulitis of intestine, part unspecified, without perforation or abscess without bleeding: Secondary | ICD-10-CM | POA: Diagnosis not present

## 2017-09-22 DIAGNOSIS — R609 Edema, unspecified: Secondary | ICD-10-CM | POA: Diagnosis not present

## 2017-09-22 DIAGNOSIS — Z8719 Personal history of other diseases of the digestive system: Secondary | ICD-10-CM | POA: Diagnosis not present

## 2017-09-22 DIAGNOSIS — E782 Mixed hyperlipidemia: Secondary | ICD-10-CM | POA: Diagnosis not present

## 2017-09-22 DIAGNOSIS — K582 Mixed irritable bowel syndrome: Secondary | ICD-10-CM | POA: Diagnosis not present

## 2017-09-22 DIAGNOSIS — I10 Essential (primary) hypertension: Secondary | ICD-10-CM | POA: Diagnosis not present

## 2017-09-22 DIAGNOSIS — K219 Gastro-esophageal reflux disease without esophagitis: Secondary | ICD-10-CM | POA: Diagnosis not present

## 2017-09-22 DIAGNOSIS — Z79899 Other long term (current) drug therapy: Secondary | ICD-10-CM | POA: Diagnosis not present

## 2017-10-01 DIAGNOSIS — M5136 Other intervertebral disc degeneration, lumbar region: Secondary | ICD-10-CM | POA: Insufficient documentation

## 2017-10-01 DIAGNOSIS — M51369 Other intervertebral disc degeneration, lumbar region without mention of lumbar back pain or lower extremity pain: Secondary | ICD-10-CM

## 2017-10-01 HISTORY — DX: Other intervertebral disc degeneration, lumbar region without mention of lumbar back pain or lower extremity pain: M51.369

## 2017-10-14 DIAGNOSIS — M5136 Other intervertebral disc degeneration, lumbar region: Secondary | ICD-10-CM | POA: Diagnosis not present

## 2017-10-21 DIAGNOSIS — M5136 Other intervertebral disc degeneration, lumbar region: Secondary | ICD-10-CM | POA: Diagnosis not present

## 2017-10-21 DIAGNOSIS — R2689 Other abnormalities of gait and mobility: Secondary | ICD-10-CM | POA: Diagnosis not present

## 2017-10-21 DIAGNOSIS — M545 Low back pain: Secondary | ICD-10-CM | POA: Diagnosis not present

## 2017-10-21 DIAGNOSIS — M6281 Muscle weakness (generalized): Secondary | ICD-10-CM | POA: Diagnosis not present

## 2017-10-21 DIAGNOSIS — K5792 Diverticulitis of intestine, part unspecified, without perforation or abscess without bleeding: Secondary | ICD-10-CM | POA: Diagnosis not present

## 2017-10-23 DIAGNOSIS — M1712 Unilateral primary osteoarthritis, left knee: Secondary | ICD-10-CM | POA: Diagnosis not present

## 2017-10-28 DIAGNOSIS — M545 Low back pain: Secondary | ICD-10-CM | POA: Diagnosis not present

## 2017-10-28 DIAGNOSIS — R2689 Other abnormalities of gait and mobility: Secondary | ICD-10-CM | POA: Diagnosis not present

## 2017-10-28 DIAGNOSIS — M6281 Muscle weakness (generalized): Secondary | ICD-10-CM | POA: Diagnosis not present

## 2017-10-28 DIAGNOSIS — M5136 Other intervertebral disc degeneration, lumbar region: Secondary | ICD-10-CM | POA: Diagnosis not present

## 2017-10-30 DIAGNOSIS — R2689 Other abnormalities of gait and mobility: Secondary | ICD-10-CM | POA: Diagnosis not present

## 2017-10-30 DIAGNOSIS — M5136 Other intervertebral disc degeneration, lumbar region: Secondary | ICD-10-CM | POA: Diagnosis not present

## 2017-10-30 DIAGNOSIS — M545 Low back pain: Secondary | ICD-10-CM | POA: Diagnosis not present

## 2017-10-30 DIAGNOSIS — M6281 Muscle weakness (generalized): Secondary | ICD-10-CM | POA: Diagnosis not present

## 2017-11-04 DIAGNOSIS — R2689 Other abnormalities of gait and mobility: Secondary | ICD-10-CM | POA: Diagnosis not present

## 2017-11-04 DIAGNOSIS — M545 Low back pain: Secondary | ICD-10-CM | POA: Diagnosis not present

## 2017-11-04 DIAGNOSIS — M6281 Muscle weakness (generalized): Secondary | ICD-10-CM | POA: Diagnosis not present

## 2017-11-04 DIAGNOSIS — M5136 Other intervertebral disc degeneration, lumbar region: Secondary | ICD-10-CM | POA: Diagnosis not present

## 2017-11-06 DIAGNOSIS — R2689 Other abnormalities of gait and mobility: Secondary | ICD-10-CM | POA: Diagnosis not present

## 2017-11-06 DIAGNOSIS — M6281 Muscle weakness (generalized): Secondary | ICD-10-CM | POA: Diagnosis not present

## 2017-11-06 DIAGNOSIS — M5136 Other intervertebral disc degeneration, lumbar region: Secondary | ICD-10-CM | POA: Diagnosis not present

## 2017-11-06 DIAGNOSIS — M545 Low back pain: Secondary | ICD-10-CM | POA: Diagnosis not present

## 2017-11-12 DIAGNOSIS — R2689 Other abnormalities of gait and mobility: Secondary | ICD-10-CM | POA: Diagnosis not present

## 2017-11-12 DIAGNOSIS — M6281 Muscle weakness (generalized): Secondary | ICD-10-CM | POA: Diagnosis not present

## 2017-11-12 DIAGNOSIS — M545 Low back pain: Secondary | ICD-10-CM | POA: Diagnosis not present

## 2017-11-12 DIAGNOSIS — M5136 Other intervertebral disc degeneration, lumbar region: Secondary | ICD-10-CM | POA: Diagnosis not present

## 2017-11-18 DIAGNOSIS — M6281 Muscle weakness (generalized): Secondary | ICD-10-CM | POA: Diagnosis not present

## 2017-11-18 DIAGNOSIS — M545 Low back pain: Secondary | ICD-10-CM | POA: Diagnosis not present

## 2017-11-18 DIAGNOSIS — R2689 Other abnormalities of gait and mobility: Secondary | ICD-10-CM | POA: Diagnosis not present

## 2017-11-18 DIAGNOSIS — M5136 Other intervertebral disc degeneration, lumbar region: Secondary | ICD-10-CM | POA: Diagnosis not present

## 2017-11-20 DIAGNOSIS — M545 Low back pain: Secondary | ICD-10-CM | POA: Diagnosis not present

## 2017-11-20 DIAGNOSIS — R2689 Other abnormalities of gait and mobility: Secondary | ICD-10-CM | POA: Diagnosis not present

## 2017-11-20 DIAGNOSIS — M6281 Muscle weakness (generalized): Secondary | ICD-10-CM | POA: Diagnosis not present

## 2017-11-20 DIAGNOSIS — M5136 Other intervertebral disc degeneration, lumbar region: Secondary | ICD-10-CM | POA: Diagnosis not present

## 2017-11-24 DIAGNOSIS — M6281 Muscle weakness (generalized): Secondary | ICD-10-CM | POA: Diagnosis not present

## 2017-11-24 DIAGNOSIS — M545 Low back pain: Secondary | ICD-10-CM | POA: Diagnosis not present

## 2017-11-24 DIAGNOSIS — R2689 Other abnormalities of gait and mobility: Secondary | ICD-10-CM | POA: Diagnosis not present

## 2017-11-24 DIAGNOSIS — M5136 Other intervertebral disc degeneration, lumbar region: Secondary | ICD-10-CM | POA: Diagnosis not present

## 2017-11-26 DIAGNOSIS — M5136 Other intervertebral disc degeneration, lumbar region: Secondary | ICD-10-CM | POA: Diagnosis not present

## 2017-11-26 DIAGNOSIS — R2689 Other abnormalities of gait and mobility: Secondary | ICD-10-CM | POA: Diagnosis not present

## 2017-11-26 DIAGNOSIS — M6281 Muscle weakness (generalized): Secondary | ICD-10-CM | POA: Diagnosis not present

## 2017-11-26 DIAGNOSIS — M545 Low back pain: Secondary | ICD-10-CM | POA: Diagnosis not present

## 2017-12-09 DIAGNOSIS — M545 Low back pain: Secondary | ICD-10-CM | POA: Diagnosis not present

## 2017-12-09 DIAGNOSIS — M6281 Muscle weakness (generalized): Secondary | ICD-10-CM | POA: Diagnosis not present

## 2017-12-09 DIAGNOSIS — R2689 Other abnormalities of gait and mobility: Secondary | ICD-10-CM | POA: Diagnosis not present

## 2017-12-09 DIAGNOSIS — M5136 Other intervertebral disc degeneration, lumbar region: Secondary | ICD-10-CM | POA: Diagnosis not present

## 2017-12-11 DIAGNOSIS — M6281 Muscle weakness (generalized): Secondary | ICD-10-CM | POA: Diagnosis not present

## 2017-12-11 DIAGNOSIS — R2689 Other abnormalities of gait and mobility: Secondary | ICD-10-CM | POA: Diagnosis not present

## 2017-12-11 DIAGNOSIS — M545 Low back pain: Secondary | ICD-10-CM | POA: Diagnosis not present

## 2017-12-11 DIAGNOSIS — M5136 Other intervertebral disc degeneration, lumbar region: Secondary | ICD-10-CM | POA: Diagnosis not present

## 2017-12-16 DIAGNOSIS — M5136 Other intervertebral disc degeneration, lumbar region: Secondary | ICD-10-CM | POA: Diagnosis not present

## 2017-12-16 DIAGNOSIS — M6281 Muscle weakness (generalized): Secondary | ICD-10-CM | POA: Diagnosis not present

## 2017-12-16 DIAGNOSIS — M545 Low back pain: Secondary | ICD-10-CM | POA: Diagnosis not present

## 2017-12-16 DIAGNOSIS — R2689 Other abnormalities of gait and mobility: Secondary | ICD-10-CM | POA: Diagnosis not present

## 2017-12-17 DIAGNOSIS — M47816 Spondylosis without myelopathy or radiculopathy, lumbar region: Secondary | ICD-10-CM

## 2017-12-17 HISTORY — DX: Spondylosis without myelopathy or radiculopathy, lumbar region: M47.816

## 2017-12-18 DIAGNOSIS — R2689 Other abnormalities of gait and mobility: Secondary | ICD-10-CM | POA: Diagnosis not present

## 2017-12-18 DIAGNOSIS — M6281 Muscle weakness (generalized): Secondary | ICD-10-CM | POA: Diagnosis not present

## 2017-12-18 DIAGNOSIS — M545 Low back pain: Secondary | ICD-10-CM | POA: Diagnosis not present

## 2017-12-18 DIAGNOSIS — M5136 Other intervertebral disc degeneration, lumbar region: Secondary | ICD-10-CM | POA: Diagnosis not present

## 2017-12-19 DIAGNOSIS — M1712 Unilateral primary osteoarthritis, left knee: Secondary | ICD-10-CM | POA: Diagnosis not present

## 2017-12-25 DIAGNOSIS — M5136 Other intervertebral disc degeneration, lumbar region: Secondary | ICD-10-CM | POA: Diagnosis not present

## 2017-12-25 DIAGNOSIS — M545 Low back pain: Secondary | ICD-10-CM | POA: Diagnosis not present

## 2017-12-25 DIAGNOSIS — M6281 Muscle weakness (generalized): Secondary | ICD-10-CM | POA: Diagnosis not present

## 2017-12-25 DIAGNOSIS — R2689 Other abnormalities of gait and mobility: Secondary | ICD-10-CM | POA: Diagnosis not present

## 2017-12-26 DIAGNOSIS — M1712 Unilateral primary osteoarthritis, left knee: Secondary | ICD-10-CM | POA: Diagnosis not present

## 2018-01-02 DIAGNOSIS — M1712 Unilateral primary osteoarthritis, left knee: Secondary | ICD-10-CM | POA: Diagnosis not present

## 2018-01-13 DIAGNOSIS — M47816 Spondylosis without myelopathy or radiculopathy, lumbar region: Secondary | ICD-10-CM | POA: Diagnosis not present

## 2018-01-20 DIAGNOSIS — R5381 Other malaise: Secondary | ICD-10-CM | POA: Diagnosis not present

## 2018-01-20 DIAGNOSIS — Z79899 Other long term (current) drug therapy: Secondary | ICD-10-CM | POA: Diagnosis not present

## 2018-01-20 DIAGNOSIS — R0600 Dyspnea, unspecified: Secondary | ICD-10-CM | POA: Diagnosis not present

## 2018-01-20 DIAGNOSIS — I10 Essential (primary) hypertension: Secondary | ICD-10-CM | POA: Diagnosis not present

## 2018-01-20 DIAGNOSIS — K582 Mixed irritable bowel syndrome: Secondary | ICD-10-CM | POA: Diagnosis not present

## 2018-01-20 DIAGNOSIS — K219 Gastro-esophageal reflux disease without esophagitis: Secondary | ICD-10-CM | POA: Diagnosis not present

## 2018-01-20 DIAGNOSIS — E782 Mixed hyperlipidemia: Secondary | ICD-10-CM | POA: Diagnosis not present

## 2018-01-20 DIAGNOSIS — R5383 Other fatigue: Secondary | ICD-10-CM | POA: Diagnosis not present

## 2018-01-20 DIAGNOSIS — Z0000 Encounter for general adult medical examination without abnormal findings: Secondary | ICD-10-CM | POA: Diagnosis not present

## 2018-01-20 DIAGNOSIS — Z8719 Personal history of other diseases of the digestive system: Secondary | ICD-10-CM | POA: Diagnosis not present

## 2018-01-20 DIAGNOSIS — R072 Precordial pain: Secondary | ICD-10-CM | POA: Diagnosis not present

## 2018-01-21 DIAGNOSIS — R9389 Abnormal findings on diagnostic imaging of other specified body structures: Secondary | ICD-10-CM

## 2018-01-21 HISTORY — DX: Abnormal findings on diagnostic imaging of other specified body structures: R93.89

## 2018-01-22 DIAGNOSIS — R079 Chest pain, unspecified: Secondary | ICD-10-CM | POA: Insufficient documentation

## 2018-01-22 HISTORY — DX: Chest pain, unspecified: R07.9

## 2018-01-26 DIAGNOSIS — R9389 Abnormal findings on diagnostic imaging of other specified body structures: Secondary | ICD-10-CM | POA: Diagnosis not present

## 2018-01-26 DIAGNOSIS — J189 Pneumonia, unspecified organism: Secondary | ICD-10-CM | POA: Diagnosis not present

## 2018-02-10 NOTE — Progress Notes (Signed)
Cardiology Office Note:    Date:  02/11/2018   ID:  Pamela Dominguez, DOB Dec 24, 1932, MRN [id number]  PCP:  [name]., MD  Cardiologist:  [name], MD    Referring MD: Bess Harvest*    ASSESSMENT:    1. Angina pectoris ([place])   2. Chest pain, unspecified type   3. SOB (shortness of breath)   4. Essential hypertension    PLAN:    In order of problems listed above:  1. She presents with typical angina class III symptoms.  We discussed management I would not repeat noninvasive stress testing and we discussed the option of direct referral to coronary angiography or medical treatment and cardiac CTA.  After review of benefits risk and options she elects to intensify medical therapy and undergo cardiac CTA will check renal function today with contrast load a BNP level with her shortness of breath although I do not think she has congestive heart failure and her troponin level as if its elevation will need to be treated as acute coronary syndrome.  If symptoms worsen she will be treated as acute coronary syndrome and drug referral to heart catheterization.  She will continue low-dose aspirin her calcium channel blocker initiate a beta-blocker and oral mono nitrates.  She is also given prescription for nitroglycerin to use as needed 2. See above referral for cardiac CTA with typical angina and recent normal stress echo 3. Clinically she has anginal equivalent we will check a BNP level if elevated may need further evaluation and treatment for heart failure 4. Stable continue treatment ARB calcium channel blocker.   Next appointment: 6 weeks   Medication Adjustments/Labs and Tests Ordered: Current medicines are reviewed at length with the patient today.  Concerns regarding medicines are outlined above.  Orders Placed This Encounter  Procedures  . CT CORONARY MORPH W/CTA COR W/SCORE W/CA W/CM &/OR WO/CM  . CT CORONARY FRACTIONAL FLOW RESERVE DATA PREP  . CT CORONARY  FRACTIONAL FLOW RESERVE FLUID ANALYSIS  . Pro b natriuretic peptide (BNP)  . Basic metabolic panel  . Troponin I  . EKG 12-Lead   Meds ordered this encounter  Medications  . DISCONTD: amLODipine-valsartan (EXFORGE) 10-320 MG tablet    Sig: Take 1 tablet by mouth daily.    Dispense:  30 tablet    Refill:  3  . isosorbide mononitrate (IMDUR) 30 MG 24 hr tablet    Sig: Take 1 tablet (30 mg total) by mouth daily.    Dispense:  30 tablet    Refill:  3  . metoprolol succinate (TOPROL XL) 25 MG 24 hr tablet    Sig: Take 1 tablet (25 mg total) by mouth daily.    Dispense:  30 tablet    Refill:  3  . nitroGLYCERIN (NITROSTAT) 0.4 MG SL tablet    Sig: Place 1 tablet (0.4 mg total) under the tongue every 5 (five) minutes as needed for chest pain.    Dispense:  25 tablet    Refill:  11    Chief Complaint  Patient presents with  . New Patient (Initial Visit)    History of Present Illness:    Pamela Dominguez is a 84 y.o. female with a hx of hypertension chest pain exertional shortness of breath last seen by Dr Jimmie Molly at [facility] cardiology 09/26/2016.  She underwent a stress echo at that office 10/14/2016 report is unavailable.  She is seen by her PCP 01/20/2018 and again had complaints of chest pain  referred to my practice Compliance with diet, lifestyle and medications: Yes  This is my initial interaction with Pamela Dominguez.  She been seen in my previous practice by different cardiologist she had exertional shortness of breath and it was felt to be normal stress echo in 2018.  In the last month she has had a marked deterioration in her abilities when she does physical effort and at rest she develops episodes of typical angina substernal pressure aching radiates up into her jaw into both arms with shortness of breath she will rest it usually passes in 1 to 2 minutes but is persisted as long as 10 minutes and the more severe episodes associated with physical effort.  And now occur several days per week.   There is no associated palpitation chest pain is not pleuritic no fever chills or diaphoresis or nausea.  When I asked her what she thinks is wrong she tells me that she has heart disease and is having angina. Past Medical History:  Diagnosis Date  . Abnormal CXR 01/21/2018  . Arthritis   . Chest pain 01/22/2018  . Chronic edema 05/19/2017  . Chronic lower back pain   . Complication of anesthesia 1989   mouth joint trouble after hysterctomy, told by dentist to haver anesthesia use bite block to keep mouth open same amount all over  . Diverticulosis   . Diverticulosis of colon 09/28/2013  . Diverticulosis of colon (without mention of hemorrhage) 09/28/2013  . DJD (degenerative joint disease)   . Essential hypertension 09/18/2015   Last Assessment & Plan:  Relevant Hx: Course: Daily Update: Today's Plan:this is stable for her at this time and will follow and on repeat she was much better overall Electronically signed by: Mayer Camel, NP 09/18/15 1146  . Family history of anesthesia complication    brother low bp, heart problems, ileus  . GERD (gastroesophageal reflux disease)   . High risk medication use 09/18/2015  . History of diverticulosis 08/28/2017  . Hyperlipidemia   . Hyperlipidemia, unspecified 09/18/2015   Last Assessment & Plan:  Update her lipids for her fasting  . Hypertension   . IBS (irritable bowel syndrome)   . Irritable bowel syndrome 09/28/2013  . Irritable bowel syndrome with both constipation and diarrhea 09/18/2015   Last Assessment & Plan:  She has more fecal incontinence and she has to wear a pad and that is part of her UTI issues, though the Lucrezia Starch has helped her about 75% to diminish this and for that she is pleased.  . OA (osteoarthritis) of knee 09/27/2013  . Osteopenia   . Other and unspecified hyperlipidemia 09/28/2013  . TMJ (dislocation of temporomandibular joint)   . Unspecified essential hypertension 09/28/2013    Past Surgical History:  Procedure  Laterality Date  . ABDOMINAL HYSTERECTOMY  1989  . APPENDECTOMY  1948  . arthroscopic knee surgery Left 1996  . CHOLECYSTECTOMY  1999  . colonscopy  2013  . NASAL SINUS SURGERY  1968  . TONSILLECTOMY  1942  . TOTAL KNEE ARTHROPLASTY Right 09/27/2013   Procedure: RIGHT TOTAL KNEE ARTHROPLASTY;  Surgeon: Gearlean Alf, MD;  Location: [facility];  Service: Orthopedics;  Laterality: Right;    Current Medications: Current Meds  Medication Sig  . amLODipine-valsartan (EXFORGE) 10-320 MG tablet Take 1 tablet by mouth daily.  Marland Kitchen aspirin EC 81 MG tablet Take 81 mg by mouth daily.  Marland Kitchen b complex vitamins tablet Take 1 tablet by mouth daily.  Marland Kitchen BIOTIN PO Take 1 tablet  by mouth daily.  . cetirizine (ZYRTEC) 10 MG tablet Take 10 mg by mouth daily.  . cholestyramine (QUESTRAN) 4 G packet Take 4 g by mouth at bedtime.  Marland Kitchen glucosamine-chondroitin 500-400 MG tablet Take 1 tablet by mouth 2 (two) times daily.  . Multiple Vitamin (MULTIVITAMIN) tablet Take 1 tablet by mouth daily.  . Multiple Vitamins-Minerals (OCUVITE PO) Take 1 tablet by mouth daily.  Marland Kitchen omeprazole (PRILOSEC) 20 MG capsule Take 20 mg by mouth 2 (two) times daily before a meal.   . spironolactone (ALDACTONE) 25 MG tablet Take 25 mg by mouth daily.  . [DISCONTINUED] amLODipine-valsartan (EXFORGE) 10-320 MG tablet Take 1 tablet by mouth daily.     Allergies:   Alendronate; Nizatidine; Penicillins; and Quinine   Social History   Socioeconomic History  . Marital status: Married    Spouse name: Not on file  . Number of children: Not on file  . Years of education: Not on file  . Highest education level: Not on file  Occupational History  . Not on file  Social Needs  . Financial resource strain: Not on file  . Food insecurity:    Worry: Not on file    Inability: Not on file  . Transportation needs:    Medical: Not on file    Non-medical: Not on file  Tobacco Use  . Smoking status: Never Smoker  . Smokeless tobacco: Never Used    Substance and Sexual Activity  . Alcohol use: No  . Drug use: No  . Sexual activity: Not on file  Lifestyle  . Physical activity:    Days per week: Not on file    Minutes per session: Not on file  . Stress: Not on file  Relationships  . Social connections:    Talks on phone: Not on file    Gets together: Not on file    Attends religious service: Not on file    Active member of club or organization: Not on file    Attends meetings of clubs or organizations: Not on file    Relationship status: Not on file  Other Topics Concern  . Not on file  Social History Narrative  . Not on file     Family History: The patient's family history includes Atrial fibrillation in her brother; Cancer in her father; Hypertension in her mother. ROS:   Please see the history of present illness.    All other systems reviewed and are negative. Review of Systems  Constitution: Positive for malaise/fatigue.  HENT: Negative.   Eyes: Negative.   Cardiovascular: Positive for chest pain and dyspnea on exertion.  Respiratory: Positive for shortness of breath.   Endocrine: Negative.   Hematologic/Lymphatic: Negative.   Skin: Negative.   Musculoskeletal: Negative.   Gastrointestinal: Negative.   Genitourinary: Negative.   Neurological: Negative.   Psychiatric/Behavioral: Negative.   Allergic/Immunologic: Negative.    EKGs/Labs/Other Studies Reviewed:    The following studies were reviewed today:  EKG:  EKG ordered today.  The ekg ordered today demonstrates sinus rhythm no acute ischemic changes EKG performed 01/20/2018 shows sinus rhythm insignificant Q waves in the inferior leads precordial T wave inversion although the EKG was interpreted as normal. Recent Labs:   01/20/2018 CMP normal cholesterol 201 HDL 75 LDL 128 non-HDL cholesterol 126 TSH normal CBC normal No results found for requested labs within last 8760 hours.  Recent Lipid Panel No results found for: CHOL, TRIG, HDL, CHOLHDL, VLDL,  LDLCALC, LDLDIRECT  Physical Exam:  VS:  BP 134/80 (BP Location: Left Arm, Patient Position: Sitting, Cuff Size: Normal)   Pulse 71   Ht 5\' 1"  (1.549 m)   Wt 146 lb (66.2 kg)   SpO2 98%   BMI 27.59 kg/m     Wt Readings from Last 3 Encounters:  02/11/18 146 lb (66.2 kg)  09/27/13 144 lb 6.4 oz (65.5 kg)  09/22/13 144 lb 6.4 oz (65.5 kg)     GEN: She looks younger than age well nourished, well developed in no acute distress HEENT: Normal NECK: No JVD; No carotid bruits LYMPHATICS: No lymphadenopathy CARDIAC: RRR, no murmurs, rubs, gallops RESPIRATORY:  Clear to auscultation without rales, wheezing or rhonchi  ABDOMEN: Soft, non-tender, non-distended MUSCULOSKELETAL:  No edema; No deformity  SKIN: Warm and dry NEUROLOGIC:  Alert and oriented x 3 PSYCHIATRIC:  Normal affect    Signed, [name], MD  02/11/2018 7:57 PM    [place]

## 2018-02-11 ENCOUNTER — Encounter: Payer: Self-pay | Admitting: Cardiology

## 2018-02-11 ENCOUNTER — Ambulatory Visit (INDEPENDENT_AMBULATORY_CARE_PROVIDER_SITE_OTHER): Payer: PPO | Admitting: Cardiology

## 2018-02-11 VITALS — BP 134/80 | HR 71 | Ht 61.0 in | Wt 146.0 lb

## 2018-02-11 DIAGNOSIS — R0602 Shortness of breath: Secondary | ICD-10-CM | POA: Diagnosis not present

## 2018-02-11 DIAGNOSIS — I209 Angina pectoris, unspecified: Secondary | ICD-10-CM

## 2018-02-11 DIAGNOSIS — I10 Essential (primary) hypertension: Secondary | ICD-10-CM | POA: Diagnosis not present

## 2018-02-11 DIAGNOSIS — R079 Chest pain, unspecified: Secondary | ICD-10-CM | POA: Diagnosis not present

## 2018-02-11 HISTORY — DX: Shortness of breath: R06.02

## 2018-02-11 MED ORDER — NITROGLYCERIN 0.4 MG SL SUBL: 0.4000 mg | SUBLINGUAL_TABLET | SUBLINGUAL | 11 refills | Status: DC | PRN

## 2018-02-11 MED ORDER — ISOSORBIDE MONONITRATE ER 30 MG PO TB24: 30.0000 mg | ORAL_TABLET | Freq: Every day | ORAL | 3 refills | Status: DC

## 2018-02-11 MED ORDER — AMLODIPINE BESYLATE-VALSARTAN 10-320 MG PO TABS: 1.0000 | ORAL_TABLET | Freq: Every day | ORAL | 3 refills | Status: DC

## 2018-02-11 MED ORDER — METOPROLOL SUCCINATE ER 25 MG PO TB24: 25.0000 mg | ORAL_TABLET | Freq: Every day | ORAL | 3 refills | Status: DC

## 2018-02-11 NOTE — Patient Instructions (Addendum)
Medication Instructions:  Your physician has recommended you make the following change in your medication:   Start: Toprolol XL 25mg  one tablet daily Start: Imdur (isosorbide) 30mg  one tablet daily Start: Nitroglycerin 0.4 mg sublingual (under the tongue) as needed for chest pain.  When having chest pain, stop what you are doing and sit down. Take 1 nitro, wait 5 minutes. Still having chest pain, take 1 nitro, wait 5 minutes. Still having chest pain, take 1 nitro, dial 911. Total of 3 nitro in 15 minutes.    Labwork: Your physician recommends that you have the following labs drawn: troponin, BNP, BMP   Testing/Procedures: Please arrive at the [facility] main entrance of [facility] at xx:xx AM (30-45 minutes prior to test start time)  [facility] [place], [place] [address] [phone]  Proceed to the [facility] Radiology Department (First Floor).  Please follow these instructions carefully (unless otherwise directed):  Hold all erectile dysfunction medications at least 48 hours prior to test.  On the Night Before the Test: . Drink plenty of water. . Do not consume any caffeinated/decaffeinated beverages or chocolate 12 hours prior to your test. . Do not take any antihistamines 12 hours prior to your test.   On the Day of the Test: . Drink plenty of water. Do not drink any water within one hour of the test. . Do not eat any food 4 hours prior to the test. . You may take your regular medications prior to the test. . IF NOT ON A BETA BLOCKER - Take 50 mg of lopressor (metoprolol) one hour before the test. . Hold spironolactone on the day of the test.   After the Test: . Drink plenty of water. . After receiving IV contrast, you may experience a mild flushed feeling. This is normal. . On occasion, you may experience a mild rash up to 24 hours after the test. This is not dangerous. If this occurs, you can take Benadryl 25 mg and increase  your fluid intake. . If you experience trouble breathing, this can be serious. If it is severe call 911 IMMEDIATELY. If it is mild, please call our office.    Follow-Up: Your physician recommends that you schedule a follow-up appointment in: 4- 6 weeks after testing.  Any Other Special Instructions Will Be Listed Below (If Applicable).     If you need a refill on your cardiac medications before your next appointment, please call your pharmacy.

## 2018-02-12 DIAGNOSIS — R079 Chest pain, unspecified: Secondary | ICD-10-CM | POA: Diagnosis not present

## 2018-02-12 LAB — BASIC METABOLIC PANEL
BUN/Creatinine Ratio: 23 (ref 12–28)
BUN: 13 mg/dL (ref 8–27)
CO2: 22 mmol/L (ref 20–29)
CREATININE: 0.56 mg/dL — AB (ref 0.57–1.00)
Calcium: 9.3 mg/dL (ref 8.7–10.3)
Chloride: 100 mmol/L (ref 96–106)
GFR, EST AFRICAN AMERICAN: 100 mL/min/{1.73_m2} (ref >59)
GFR, EST NON AFRICAN AMERICAN: 87 mL/min/{1.73_m2} (ref >59)
Glucose: 90 mg/dL (ref 65–99)
Potassium: 4.1 mmol/L (ref 3.5–5.2)
Sodium: 137 mmol/L (ref 134–144)

## 2018-02-12 LAB — TROPONIN I: Troponin I: <0.01 ng/mL (ref 0.00–0.04)

## 2018-02-12 LAB — PRO B NATRIURETIC PEPTIDE: NT-Pro BNP: 275 pg/mL (ref 0–738)

## 2018-03-04 ENCOUNTER — Telehealth: Payer: Self-pay | Admitting: Cardiology

## 2018-03-04 NOTE — Telephone Encounter (Signed)
Patient states that she has never received appt for her C and they have already received approval for the CT from Insurance. Please check on.

## 2018-03-05 NOTE — Telephone Encounter (Signed)
Spoke with patient to let her know a message has been sent to scheduling team, and as soon as I hear something back I will make her aware.  Patient verbalized understanding.

## 2018-03-05 NOTE — Telephone Encounter (Signed)
Patient aware that she has been scheduled for Cardiac CTA on 03-27-18, and will see Dr Bettina Gavia on 04-02-18.  Per patient, she was instructed by scheduling staff that she will not require labs prior to testing.

## 2018-03-19 ENCOUNTER — Ambulatory Visit: Payer: PPO | Admitting: Cardiology

## 2018-03-27 ENCOUNTER — Ambulatory Visit (HOSPITAL_COMMUNITY): Admission: RE | Admit: 2018-03-27 | Payer: PPO | Source: Ambulatory Visit

## 2018-03-27 ENCOUNTER — Ambulatory Visit (HOSPITAL_COMMUNITY)
Admission: RE | Admit: 2018-03-27 | Discharge: 2018-03-27 | Disposition: A | Discharge status: Home / Self Care | Payer: PPO | Source: Ambulatory Visit | Attending: Cardiology | Admitting: Cardiology

## 2018-03-27 DIAGNOSIS — R079 Chest pain, unspecified: Secondary | ICD-10-CM | POA: Diagnosis not present

## 2018-03-27 DIAGNOSIS — I2510 Atherosclerotic heart disease of native coronary artery without angina pectoris: Secondary | ICD-10-CM | POA: Insufficient documentation

## 2018-03-27 DIAGNOSIS — I700 Atherosclerosis of aorta: Secondary | ICD-10-CM | POA: Insufficient documentation

## 2018-03-27 DIAGNOSIS — R0789 Other chest pain: Secondary | ICD-10-CM | POA: Diagnosis not present

## 2018-03-27 MED ORDER — NITROGLYCERIN 0.4 MG SL SUBL
0.8000 mg | SUBLINGUAL_TABLET | Freq: Once | SUBLINGUAL | Status: AC
  Administered 2018-03-27: 0.8 mg via SUBLINGUAL
  Filled 2018-03-27: qty 25

## 2018-03-27 MED ORDER — NITROGLYCERIN 0.4 MG SL SUBL
SUBLINGUAL_TABLET | SUBLINGUAL | Status: AC
  Filled 2018-03-27: qty 2

## 2018-03-27 MED ORDER — IOPAMIDOL (ISOVUE-370) INJECTION 76%
100.0000 mL | Freq: Once | INTRAVENOUS | Status: AC | PRN
  Administered 2018-03-27: 80 mL via INTRAVENOUS

## 2018-03-30 ENCOUNTER — Ambulatory Visit (HOSPITAL_COMMUNITY)
Admission: RE | Admit: 2018-03-30 | Discharge: 2018-03-30 | Disposition: A | Discharge status: Home / Self Care | Payer: PPO | Source: Ambulatory Visit | Attending: Cardiology | Admitting: Cardiology

## 2018-03-30 DIAGNOSIS — I25119 Atherosclerotic heart disease of native coronary artery with unspecified angina pectoris: Secondary | ICD-10-CM | POA: Diagnosis not present

## 2018-03-30 DIAGNOSIS — I209 Angina pectoris, unspecified: Secondary | ICD-10-CM | POA: Diagnosis present

## 2018-03-31 ENCOUNTER — Other Ambulatory Visit: Payer: Self-pay | Admitting: Cardiology

## 2018-03-31 ENCOUNTER — Other Ambulatory Visit (HOSPITAL_COMMUNITY): Payer: Self-pay | Admitting: Cardiology

## 2018-04-01 NOTE — H&P (View-Only) (Signed)
Cardiology Office Note:    Date:  04/02/2018   ID:  Pamela Dominguez, DOB 07-24-1933, MRN [id number]  PCP:  [name]., MD  Cardiologist:  [name], MD    Referring MD: [name]., MD    ASSESSMENT:    1. Angina pectoris ([place])   2. Coronary artery disease of native artery of native heart with stable angina pectoris ([place])   3. Essential hypertension   4. Hyperlipidemia, unspecified hyperlipidemia type    PLAN:    In order of problems listed above:  1. As described in history referral for coronary angiography and PCI if appropriate.  Her cardiac CTA shows flow-limiting stenosis proximal LAD 2. Continue current medical therapy referral for PCI 3. Stable blood pressure target continue current treatment 4. Continue current therapy   Next appointment: 6 weeks   Medication Adjustments/Labs and Tests Ordered: Current medicines are reviewed at length with the patient today.  Concerns regarding medicines are outlined above.  Orders Placed This Encounter  Procedures  . EKG 12-Lead   No orders of the defined types were placed in this encounter.   Chief Complaint  Patient presents with  . Coronary Artery Disease    History of Present Illness:    Pamela Dominguez is a 84 y.o. female with a hx of hypertension chest pain exertional shortness of breath  02/11/2018 last seen.  At that time she was found to have class III typical angina pectoris was referred for cardiac CTA which shows flow-limiting stenosis in the proximal left anterior descending coronary artery. Compliance with diet, lifestyle and medications: Yes  Despite medical therapy she is displeased with the quality of her life she has shortness of breath that limits her for more than minor activities and continues to have episodes of typical angina with and without physical effort despite taking multiple antianginal medications.  As expected her cardiac CTA showed flow-limiting stenosis to proximal LAD.  I met with the  patient and her daughter reviewed her history class III angina abnormal cardiac CTA and reduced fractional flow reserve.  We discussed the option of further intensification of medical treatment referral for coronary angiography and PCI options benefits risk detailed in the patient and daughter are in agreement.  She had diarrhea after cardiac CTA but no urticaria or obvious allergic reaction as normal renal function and no contraindication to dual antiplatelet therapy. Past Medical History:  Diagnosis Date  . Abnormal CXR 01/21/2018  . Arthritis   . Chest pain 01/22/2018  . Chronic edema 05/19/2017  . Chronic lower back pain   . Complication of anesthesia 1989   mouth joint trouble after hysterctomy, told by dentist to haver anesthesia use bite block to keep mouth open same amount all over  . Diverticulosis   . Diverticulosis of colon 09/28/2013  . Diverticulosis of colon (without mention of hemorrhage) 09/28/2013  . DJD (degenerative joint disease)   . Essential hypertension 09/18/2015   Last Assessment & Plan:  Relevant Hx: Course: Daily Update: Today's Plan:this is stable for her at this time and will follow and on repeat she was much better overall Electronically signed by: Mayer Camel, NP 09/18/15 1146  . Family history of anesthesia complication    brother low bp, heart problems, ileus  . GERD (gastroesophageal reflux disease)   . High risk medication use 09/18/2015  . History of diverticulosis 08/28/2017  . Hyperlipidemia   . Hyperlipidemia, unspecified 09/18/2015   Last Assessment & Plan:  Update her lipids  for her fasting  . Hypertension   . IBS (irritable bowel syndrome)   . Irritable bowel syndrome 09/28/2013  . Irritable bowel syndrome with both constipation and diarrhea 09/18/2015   Last Assessment & Plan:  She has more fecal incontinence and she has to wear a pad and that is part of her UTI issues, though the Lucrezia Starch has helped her about 75% to diminish this and for that  she is pleased.  . OA (osteoarthritis) of knee 09/27/2013  . Osteopenia   . Other and unspecified hyperlipidemia 09/28/2013  . TMJ (dislocation of temporomandibular joint)   . Unspecified essential hypertension 09/28/2013    Past Surgical History:  Procedure Laterality Date  . ABDOMINAL HYSTERECTOMY  1989  . APPENDECTOMY  1948  . arthroscopic knee surgery Left 1996  . CHOLECYSTECTOMY  1999  . colonscopy  2013  . NASAL SINUS SURGERY  1968  . TONSILLECTOMY  1942  . TOTAL KNEE ARTHROPLASTY Right 09/27/2013   Procedure: RIGHT TOTAL KNEE ARTHROPLASTY;  Surgeon: Gearlean Alf, MD;  Location: [facility];  Service: Orthopedics;  Laterality: Right;    Current Medications: Current Meds  Medication Sig  . amLODipine-valsartan (EXFORGE) 10-320 MG tablet Take 1 tablet by mouth daily.  Marland Kitchen aspirin EC 81 MG tablet Take 81 mg by mouth daily.  Marland Kitchen b complex vitamins tablet Take 1 tablet by mouth daily.  Marland Kitchen BIOTIN PO Take 1 tablet by mouth daily.  . cetirizine (ZYRTEC) 10 MG tablet Take 10 mg by mouth daily.  . cholestyramine (QUESTRAN) 4 G packet Take 4 g by mouth at bedtime.  Marland Kitchen glucosamine-chondroitin 500-400 MG tablet Take 1 tablet by mouth 2 (two) times daily.  . isosorbide mononitrate (IMDUR) 30 MG 24 hr tablet Take 1 tablet (30 mg total) by mouth daily.  . metoprolol succinate (TOPROL XL) 25 MG 24 hr tablet Take 1 tablet (25 mg total) by mouth daily.  . Multiple Vitamin (MULTIVITAMIN) tablet Take 1 tablet by mouth daily.  . Multiple Vitamins-Minerals (OCUVITE PO) Take 1 tablet by mouth daily.  . nitroGLYCERIN (NITROSTAT) 0.4 MG SL tablet Place 1 tablet (0.4 mg total) under the tongue every 5 (five) minutes as needed for chest pain.  Marland Kitchen omeprazole (PRILOSEC) 20 MG capsule Take 20 mg by mouth 2 (two) times daily before a meal.   . spironolactone (ALDACTONE) 25 MG tablet Take 25 mg by mouth daily.     Allergies:   Actonel [risedronate sodium]; Alendronate; Nizatidine; Penicillins; and Quinine   Social  History   Socioeconomic History  . Marital status: Married    Spouse name: Not on file  . Number of children: Not on file  . Years of education: Not on file  . Highest education level: Not on file  Occupational History  . Not on file  Social Needs  . Financial resource strain: Not on file  . Food insecurity:    Worry: Not on file    Inability: Not on file  . Transportation needs:    Medical: Not on file    Non-medical: Not on file  Tobacco Use  . Smoking status: Never Smoker  . Smokeless tobacco: Never Used  Substance and Sexual Activity  . Alcohol use: No  . Drug use: No  . Sexual activity: Not on file  Lifestyle  . Physical activity:    Days per week: Not on file    Minutes per session: Not on file  . Stress: Not on file  Relationships  . Social connections:  Talks on phone: Not on file    Gets together: Not on file    Attends religious service: Not on file    Active member of club or organization: Not on file    Attends meetings of clubs or organizations: Not on file    Relationship status: Not on file  Other Topics Concern  . Not on file  Social History Narrative  . Not on file     Family History: The patient's family history includes Atrial fibrillation in her brother; Cancer in her father; Hypertension in her mother. ROS:   Please see the history of present illness.    All other systems reviewed and are negative.RTH normal  Recent Labs: 02/11/2018: BUN 13; Creatinine, Ser 0.56; NT-Pro BNP 275; Potassium 4.1; Sodium 137  Recent Lipid Panel No results found for: CHOL, TRIG, HDL, CHOLHDL, VLDL, LDLCALC, LDLDIRECT  Physical Exam:    VS:  BP 132/76 (BP Location: Right Arm, Patient Position: Sitting, Cuff Size: Normal)   Pulse 65   Ht 5' 1"  (1.549 m)   Wt 142 lb 12.8 oz (64.8 kg)   SpO2 96%   BMI 26.98 kg/m     Wt Readings from Last 3 Encounters:  04/02/18 142 lb 12.8 oz (64.8 kg)  02/11/18 146 lb (66.2 kg)  09/27/13 144 lb 6.4 oz (65.5 kg)      GEN:  Well nourished, well developed in no acute distress HEENT: Normal NECK: No JVD; No carotid bruits LYMPHATICS: No lymphadenopathy CARDIAC: RRR, no murmurs, rubs, gallops RESPIRATORY:  Clear to auscultation without rales, wheezing or rhonchi  ABDOMEN: Soft, non-tender, non-distended MUSCULOSKELETAL:  No edema; No deformity  SKIN: Warm and dry NEUROLOGIC:  Alert and oriented x 3 PSYCHIATRIC:  Normal affect    Signed, [name], MD  04/02/2018 10:54 AM    [place]

## 2018-04-01 NOTE — Progress Notes (Signed)
Cardiology Office Note:    Date:  04/02/2018   ID:  Pamela Dominguez, DOB 11-04-1932, MRN [id number]  PCP:  [name]., MD  Cardiologist:  [name], MD    Referring MD: [name]., MD    ASSESSMENT:    1. Angina pectoris ([place])   2. Coronary artery disease of native artery of native heart with stable angina pectoris ([place])   3. Essential hypertension   4. Hyperlipidemia, unspecified hyperlipidemia type    PLAN:    In order of problems listed above:  1. As described in history referral for coronary angiography and PCI if appropriate.  Her cardiac CTA shows flow-limiting stenosis proximal LAD 2. Continue current medical therapy referral for PCI 3. Stable blood pressure target continue current treatment 4. Continue current therapy   Next appointment: 6 weeks   Medication Adjustments/Labs and Tests Ordered: Current medicines are reviewed at length with the patient today.  Concerns regarding medicines are outlined above.  Orders Placed This Encounter  Procedures  . EKG 12-Lead   No orders of the defined types were placed in this encounter.   Chief Complaint  Patient presents with  . Coronary Artery Disease    History of Present Illness:    Pamela Dominguez is a 84 y.o. female with a hx of hypertension chest pain exertional shortness of breath  02/11/2018 last seen.  At that time she was found to have class III typical angina pectoris was referred for cardiac CTA which shows flow-limiting stenosis in the proximal left anterior descending coronary artery. Compliance with diet, lifestyle and medications: Yes  Despite medical therapy she is displeased with the quality of her life she has shortness of breath that limits her for more than minor activities and continues to have episodes of typical angina with and without physical effort despite taking multiple antianginal medications.  As expected her cardiac CTA showed flow-limiting stenosis to proximal LAD.  I met with the  patient and her daughter reviewed her history class III angina abnormal cardiac CTA and reduced fractional flow reserve.  We discussed the option of further intensification of medical treatment referral for coronary angiography and PCI options benefits risk detailed in the patient and daughter are in agreement.  She had diarrhea after cardiac CTA but no urticaria or obvious allergic reaction as normal renal function and no contraindication to dual antiplatelet therapy. Past Medical History:  Diagnosis Date  . Abnormal CXR 01/21/2018  . Arthritis   . Chest pain 01/22/2018  . Chronic edema 05/19/2017  . Chronic lower back pain   . Complication of anesthesia 1989   mouth joint trouble after hysterctomy, told by dentist to haver anesthesia use bite block to keep mouth open same amount all over  . Diverticulosis   . Diverticulosis of colon 09/28/2013  . Diverticulosis of colon (without mention of hemorrhage) 09/28/2013  . DJD (degenerative joint disease)   . Essential hypertension 09/18/2015   Last Assessment & Plan:  Relevant Hx: Course: Daily Update: Today's Plan:this is stable for her at this time and will follow and on repeat she was much better overall Electronically signed by: Mayer Camel, NP 09/18/15 1146  . Family history of anesthesia complication    brother low bp, heart problems, ileus  . GERD (gastroesophageal reflux disease)   . High risk medication use 09/18/2015  . History of diverticulosis 08/28/2017  . Hyperlipidemia   . Hyperlipidemia, unspecified 09/18/2015   Last Assessment & Plan:  Update her lipids  for her fasting  . Hypertension   . IBS (irritable bowel syndrome)   . Irritable bowel syndrome 09/28/2013  . Irritable bowel syndrome with both constipation and diarrhea 09/18/2015   Last Assessment & Plan:  She has more fecal incontinence and she has to wear a pad and that is part of her UTI issues, though the Lucrezia Starch has helped her about 75% to diminish this and for that  she is pleased.  . OA (osteoarthritis) of knee 09/27/2013  . Osteopenia   . Other and unspecified hyperlipidemia 09/28/2013  . TMJ (dislocation of temporomandibular joint)   . Unspecified essential hypertension 09/28/2013    Past Surgical History:  Procedure Laterality Date  . ABDOMINAL HYSTERECTOMY  1989  . APPENDECTOMY  1948  . arthroscopic knee surgery Left 1996  . CHOLECYSTECTOMY  1999  . colonscopy  2013  . NASAL SINUS SURGERY  1968  . TONSILLECTOMY  1942  . TOTAL KNEE ARTHROPLASTY Right 09/27/2013   Procedure: RIGHT TOTAL KNEE ARTHROPLASTY;  Surgeon: Gearlean Alf, MD;  Location: [facility];  Service: Orthopedics;  Laterality: Right;    Current Medications: Current Meds  Medication Sig  . amLODipine-valsartan (EXFORGE) 10-320 MG tablet Take 1 tablet by mouth daily.  Marland Kitchen aspirin EC 81 MG tablet Take 81 mg by mouth daily.  Marland Kitchen b complex vitamins tablet Take 1 tablet by mouth daily.  Marland Kitchen BIOTIN PO Take 1 tablet by mouth daily.  . cetirizine (ZYRTEC) 10 MG tablet Take 10 mg by mouth daily.  . cholestyramine (QUESTRAN) 4 G packet Take 4 g by mouth at bedtime.  Marland Kitchen glucosamine-chondroitin 500-400 MG tablet Take 1 tablet by mouth 2 (two) times daily.  . isosorbide mononitrate (IMDUR) 30 MG 24 hr tablet Take 1 tablet (30 mg total) by mouth daily.  . metoprolol succinate (TOPROL XL) 25 MG 24 hr tablet Take 1 tablet (25 mg total) by mouth daily.  . Multiple Vitamin (MULTIVITAMIN) tablet Take 1 tablet by mouth daily.  . Multiple Vitamins-Minerals (OCUVITE PO) Take 1 tablet by mouth daily.  . nitroGLYCERIN (NITROSTAT) 0.4 MG SL tablet Place 1 tablet (0.4 mg total) under the tongue every 5 (five) minutes as needed for chest pain.  Marland Kitchen omeprazole (PRILOSEC) 20 MG capsule Take 20 mg by mouth 2 (two) times daily before a meal.   . spironolactone (ALDACTONE) 25 MG tablet Take 25 mg by mouth daily.     Allergies:   Actonel [risedronate sodium]; Alendronate; Nizatidine; Penicillins; and Quinine   Social  History   Socioeconomic History  . Marital status: Married    Spouse name: Not on file  . Number of children: Not on file  . Years of education: Not on file  . Highest education level: Not on file  Occupational History  . Not on file  Social Needs  . Financial resource strain: Not on file  . Food insecurity:    Worry: Not on file    Inability: Not on file  . Transportation needs:    Medical: Not on file    Non-medical: Not on file  Tobacco Use  . Smoking status: Never Smoker  . Smokeless tobacco: Never Used  Substance and Sexual Activity  . Alcohol use: No  . Drug use: No  . Sexual activity: Not on file  Lifestyle  . Physical activity:    Days per week: Not on file    Minutes per session: Not on file  . Stress: Not on file  Relationships  . Social connections:  Talks on phone: Not on file    Gets together: Not on file    Attends religious service: Not on file    Active member of club or organization: Not on file    Attends meetings of clubs or organizations: Not on file    Relationship status: Not on file  Other Topics Concern  . Not on file  Social History Narrative  . Not on file     Family History: The patient's family history includes Atrial fibrillation in her brother; Cancer in her father; Hypertension in her mother. ROS:   Please see the history of present illness.    All other systems reviewed and are negative.RTH normal  Recent Labs: 02/11/2018: BUN 13; Creatinine, Ser 0.56; NT-Pro BNP 275; Potassium 4.1; Sodium 137  Recent Lipid Panel No results found for: CHOL, TRIG, HDL, CHOLHDL, VLDL, LDLCALC, LDLDIRECT  Physical Exam:    VS:  BP 132/76 (BP Location: Right Arm, Patient Position: Sitting, Cuff Size: Normal)   Pulse 65   Ht 5' 1"  (1.549 m)   Wt 142 lb 12.8 oz (64.8 kg)   SpO2 96%   BMI 26.98 kg/m     Wt Readings from Last 3 Encounters:  04/02/18 142 lb 12.8 oz (64.8 kg)  02/11/18 146 lb (66.2 kg)  09/27/13 144 lb 6.4 oz (65.5 kg)      GEN:  Well nourished, well developed in no acute distress HEENT: Normal NECK: No JVD; No carotid bruits LYMPHATICS: No lymphadenopathy CARDIAC: RRR, no murmurs, rubs, gallops RESPIRATORY:  Clear to auscultation without rales, wheezing or rhonchi  ABDOMEN: Soft, non-tender, non-distended MUSCULOSKELETAL:  No edema; No deformity  SKIN: Warm and dry NEUROLOGIC:  Alert and oriented x 3 PSYCHIATRIC:  Normal affect    Signed, [name], MD  04/02/2018 10:54 AM    [place]

## 2018-04-02 ENCOUNTER — Ambulatory Visit (INDEPENDENT_AMBULATORY_CARE_PROVIDER_SITE_OTHER): Payer: PPO | Admitting: Cardiology

## 2018-04-02 ENCOUNTER — Encounter: Payer: Self-pay | Admitting: Cardiology

## 2018-04-02 VITALS — BP 132/76 | HR 65 | Ht 61.0 in | Wt 142.8 lb

## 2018-04-02 DIAGNOSIS — I10 Essential (primary) hypertension: Secondary | ICD-10-CM | POA: Diagnosis not present

## 2018-04-02 DIAGNOSIS — I209 Angina pectoris, unspecified: Secondary | ICD-10-CM | POA: Diagnosis not present

## 2018-04-02 DIAGNOSIS — I25118 Atherosclerotic heart disease of native coronary artery with other forms of angina pectoris: Secondary | ICD-10-CM | POA: Diagnosis not present

## 2018-04-02 DIAGNOSIS — E785 Hyperlipidemia, unspecified: Secondary | ICD-10-CM | POA: Diagnosis not present

## 2018-04-02 DIAGNOSIS — Z01818 Encounter for other preprocedural examination: Secondary | ICD-10-CM | POA: Diagnosis not present

## 2018-04-02 LAB — CBC
HEMOGLOBIN: 13.8 g/dL (ref 11.1–15.9)
Hematocrit: 39.5 % (ref 34.0–46.6)
MCH: 30.8 pg (ref 26.6–33.0)
MCHC: 34.9 g/dL (ref 31.5–35.7)
MCV: 88 fL (ref 79–97)
Platelets: 208 10*3/uL (ref 150–450)
RBC: 4.48 x10E6/uL (ref 3.77–5.28)
RDW: 13.3 % (ref 12.3–15.4)
WBC: 3.8 10*3/uL (ref 3.4–10.8)

## 2018-04-02 LAB — BASIC METABOLIC PANEL
BUN/Creatinine Ratio: 20 (ref 12–28)
BUN: 11 mg/dL (ref 8–27)
CALCIUM: 9.7 mg/dL (ref 8.7–10.3)
CO2: 22 mmol/L (ref 20–29)
CREATININE: 0.55 mg/dL — AB (ref 0.57–1.00)
Chloride: 99 mmol/L (ref 96–106)
GFR calc Af Amer: 101 mL/min/{1.73_m2} (ref >59)
GFR calc non Af Amer: 88 mL/min/{1.73_m2} (ref >59)
GLUCOSE: 99 mg/dL (ref 65–99)
Potassium: 4.4 mmol/L (ref 3.5–5.2)
Sodium: 137 mmol/L (ref 134–144)

## 2018-04-02 NOTE — Patient Instructions (Addendum)
Medication Instructions:  Your physician recommends that you continue on your current medications as directed. Please refer to the Current Medication list given to you today.   Labwork: Your physician recommends that you return for lab work today: BMP, CBC.   Testing/Procedures: You had an EKG today.      [place] [id number] Dept: [phone] Loc: [place]  04/02/2018  You are scheduled for a Cardiac Catheterization on Wednesday, September 11 with Dr. [name].  1. Please arrive at the [facility] (Main Entrance A) at [facility]: [address] [place], [place] [address] at 10:30 AM (This time is two hours before your procedure to ensure your preparation). Free valet parking service is available.   Special note: Every effort is made to have your procedure done on time. Please understand that emergencies sometimes delay scheduled procedures.  2. Diet: Do not eat solid foods after midnight.  The patient may have clear liquids until 5am upon the day of the procedure.  3. Labs: None needed.  4. Medication instructions in preparation for your procedure:   Contrast Allergy: No    Stop taking, Spirnolactone on Tuesday, September 10. Hold amlodipine-valsartan starting on Tuesday 04/07/18 until after the catheterization is complete.   On the morning of your procedure, take your Aspirin and any morning medicines NOT listed above.  You may use sips of water.  5. Plan for one night stay--bring personal belongings. 6. Bring a current list of your medications and current insurance cards. 7. You MUST have a responsible person to drive you home. 8. Someone MUST be with you the first 24 hours after you arrive home or your discharge will be delayed. 9. Please wear clothes that are easy to get on and off and wear slip-on shoes.  Thank you for allowing  [place] to care for you!   -- [facility]   Follow-Up: Your physician recommends that you schedule a follow-up appointment in: 6 weeks.   If you need a refill on your cardiac medications before your next appointment, please call your pharmacy.   Thank you for choosing [facility]! Robyne Peers, RN [phone]    Coronary Angiogram With Stent Coronary angiogram with stent placement is a procedure to widen or open a narrow blood vessel of the heart (coronary artery). Arteries may become blocked by cholesterol buildup (plaques) in the lining of the wall. When a coronary artery becomes partially blocked, blood flow to that area decreases. This may lead to chest pain or a heart attack (myocardial infarction). A stent is a small piece of metal that looks like mesh or a spring. Stent placement may be done as treatment for a heart attack or right after a coronary angiogram in which a blocked artery is found. Let your health care provider know about:  Any allergies you have.  All medicines you are taking, including vitamins, herbs, eye drops, creams, and over-the-counter medicines.  Any problems you or family members have had with anesthetic medicines.  Any blood disorders you have.  Any surgeries you have had.  Any medical conditions you have.  Whether you are pregnant or may be pregnant. What are the risks? Generally, this is a safe procedure. However, problems may occur, including:  Damage to the heart or its blood vessels.  A return of blockage.  Bleeding, infection, or bruising at the insertion site.  A  collection of blood under the skin (hematoma) at the insertion site.  A blood clot in another part of the body.  Kidney injury.  Allergic reaction to the dye or contrast that is used.  Bleeding into the abdomen (retroperitoneal bleeding).  What happens before the procedure? Staying hydrated Follow instructions from your health  care provider about hydration, which may include:  Up to 2 hours before the procedure - you may continue to drink clear liquids, such as water, clear fruit juice, black coffee, and plain tea.  Eating and drinking restrictions Follow instructions from your health care provider about eating and drinking, which may include:  8 hours before the procedure - stop eating heavy meals or foods such as meat, fried foods, or fatty foods.  6 hours before the procedure - stop eating light meals or foods, such as toast or cereal.  2 hours before the procedure - stop drinking clear liquids.  Ask your health care provider about:  Changing or stopping your regular medicines. This is especially important if you are taking diabetes medicines or blood thinners.  Taking medicines such as ibuprofen. These medicines can thin your blood. Do not take these medicines before your procedure if your health care provider instructs you not to. Generally, aspirin is recommended before a procedure of passing a small, thin tube (catheter) through a blood vessel and into the heart (cardiac catheterization).  What happens during the procedure?  An IV tube will be inserted into one of your veins.  You will be given one or more of the following: ? A medicine to help you relax (sedative). ? A medicine to numb the area where the catheter will be inserted into an artery (local anesthetic).  To reduce your risk of infection: ? Your health care team will wash or sanitize their hands. ? Your skin will be washed with soap. ? Hair may be removed from the area where the catheter will be inserted.  Using a guide wire, the catheter will be inserted into an artery. The location may be in your groin, in your wrist, or in the fold of your arm (near your elbow).  A type of X-ray (fluoroscopy) will be used to help guide the catheter to the opening of the arteries in the heart.  A dye will be injected into the catheter, and X-rays  will be taken. The dye will help to show where any narrowing or blockages are located in the arteries.  A tiny wire will be guided to the blocked spot, and a balloon will be inflated to make the artery wider.  The stent will be expanded and will crush the plaques into the wall of the vessel. The stent will hold the area open and improve the blood flow. Most stents have a drug coating to reduce the risk of the stent narrowing over time.  The artery may be made wider using a drill, laser, or other tools to remove plaques.  When the blood flow is better, the catheter will be removed. The lining of the artery will grow over the stent, which stays where it was placed. This procedure may vary among health care providers and hospitals. What happens after the procedure?  If the procedure is done through the leg, you will be kept in bed lying flat for about 6 hours. You will be instructed to not bend and not cross your legs.  The insertion site will be checked frequently.  The pulse in your foot or wrist will be checked  frequently.  You may have additional blood tests, X-rays, and a test that records the electrical activity of your heart (electrocardiogram, or ECG). This information is not intended to replace advice given to you by your health care provider. Make sure you discuss any questions you have with your health care provider. Document Released: 01/19/2003 Document Revised: 03/14/2016 Document Reviewed: 02/18/2016 Elsevier Interactive Patient Education  [employer].

## 2018-04-04 DIAGNOSIS — R931 Abnormal findings on diagnostic imaging of heart and coronary circulation: Secondary | ICD-10-CM | POA: Diagnosis present

## 2018-04-04 DIAGNOSIS — I209 Angina pectoris, unspecified: Secondary | ICD-10-CM

## 2018-04-04 HISTORY — DX: Abnormal findings on diagnostic imaging of heart and coronary circulation: R93.1

## 2018-04-04 HISTORY — DX: Angina pectoris, unspecified: I20.9

## 2018-04-06 LAB — POCT I-STAT CREATININE: CREATININE: 0.4 mg/dL — AB (ref 0.44–1.00)

## 2018-04-07 ENCOUNTER — Telehealth: Payer: Self-pay | Admitting: *Deleted

## 2018-04-07 NOTE — Telephone Encounter (Signed)
Pt contacted pre-catheterization scheduled at [facility] for: Wednesday April 08, 2018 12:30 PM Verified arrival time and place: [place] Entrance A at: 10:30 AM  No solid food after midnight prior to cath, clear liquids until 5 AM day of procedure. Verified allergies in Epic Verified no diabetes medications.  Hold: Spironolactone-day before and day of procedure. Amlodipine-valsartan-day before and day of procedure.   Except hold medications AM meds can be  taken pre-cath with sip of water including: ASA 81 mg  Confirmed patient has responsible person to drive home post procedure and for 24 hours after you arrive home: yes

## 2018-04-08 ENCOUNTER — Ambulatory Visit (HOSPITAL_COMMUNITY)
Admission: RE | Admit: 2018-04-08 | Discharge: 2018-04-09 | Disposition: A | Discharge status: Home / Self Care | Payer: PPO | Source: Ambulatory Visit | Attending: Cardiology | Admitting: Cardiology

## 2018-04-08 ENCOUNTER — Encounter (HOSPITAL_COMMUNITY)
Admission: RE | Disposition: A | Discharge status: Home / Self Care | Payer: Self-pay | Source: Ambulatory Visit | Attending: Cardiology

## 2018-04-08 ENCOUNTER — Other Ambulatory Visit: Payer: Self-pay

## 2018-04-08 ENCOUNTER — Encounter (HOSPITAL_COMMUNITY): Payer: Self-pay | Admitting: General Practice

## 2018-04-08 DIAGNOSIS — R931 Abnormal findings on diagnostic imaging of heart and coronary circulation: Secondary | ICD-10-CM | POA: Diagnosis not present

## 2018-04-08 DIAGNOSIS — Z9889 Other specified postprocedural states: Secondary | ICD-10-CM | POA: Insufficient documentation

## 2018-04-08 DIAGNOSIS — Z888 Allergy status to other drugs, medicaments and biological substances status: Secondary | ICD-10-CM | POA: Diagnosis not present

## 2018-04-08 DIAGNOSIS — Z8249 Family history of ischemic heart disease and other diseases of the circulatory system: Secondary | ICD-10-CM | POA: Diagnosis not present

## 2018-04-08 DIAGNOSIS — Z9861 Coronary angioplasty status: Secondary | ICD-10-CM

## 2018-04-08 DIAGNOSIS — I10 Essential (primary) hypertension: Secondary | ICD-10-CM | POA: Diagnosis not present

## 2018-04-08 DIAGNOSIS — I2510 Atherosclerotic heart disease of native coronary artery without angina pectoris: Secondary | ICD-10-CM

## 2018-04-08 DIAGNOSIS — R0602 Shortness of breath: Secondary | ICD-10-CM | POA: Diagnosis not present

## 2018-04-08 DIAGNOSIS — Z7982 Long term (current) use of aspirin: Secondary | ICD-10-CM | POA: Insufficient documentation

## 2018-04-08 DIAGNOSIS — Z90710 Acquired absence of both cervix and uterus: Secondary | ICD-10-CM | POA: Diagnosis not present

## 2018-04-08 DIAGNOSIS — I25110 Atherosclerotic heart disease of native coronary artery with unstable angina pectoris: Secondary | ICD-10-CM | POA: Diagnosis not present

## 2018-04-08 DIAGNOSIS — E785 Hyperlipidemia, unspecified: Secondary | ICD-10-CM | POA: Diagnosis not present

## 2018-04-08 DIAGNOSIS — K219 Gastro-esophageal reflux disease without esophagitis: Secondary | ICD-10-CM | POA: Insufficient documentation

## 2018-04-08 DIAGNOSIS — M8580 Other specified disorders of bone density and structure, unspecified site: Secondary | ICD-10-CM | POA: Diagnosis not present

## 2018-04-08 DIAGNOSIS — Z96651 Presence of right artificial knee joint: Secondary | ICD-10-CM | POA: Diagnosis not present

## 2018-04-08 DIAGNOSIS — I200 Unstable angina: Secondary | ICD-10-CM | POA: Diagnosis present

## 2018-04-08 DIAGNOSIS — I25118 Atherosclerotic heart disease of native coronary artery with other forms of angina pectoris: Secondary | ICD-10-CM | POA: Diagnosis present

## 2018-04-08 DIAGNOSIS — Z9049 Acquired absence of other specified parts of digestive tract: Secondary | ICD-10-CM | POA: Diagnosis not present

## 2018-04-08 DIAGNOSIS — Z79899 Other long term (current) drug therapy: Secondary | ICD-10-CM | POA: Insufficient documentation

## 2018-04-08 DIAGNOSIS — Z8719 Personal history of other diseases of the digestive system: Secondary | ICD-10-CM | POA: Diagnosis not present

## 2018-04-08 DIAGNOSIS — Z955 Presence of coronary angioplasty implant and graft: Secondary | ICD-10-CM

## 2018-04-08 DIAGNOSIS — K580 Irritable bowel syndrome with diarrhea: Secondary | ICD-10-CM | POA: Insufficient documentation

## 2018-04-08 DIAGNOSIS — Z880 Allergy status to penicillin: Secondary | ICD-10-CM | POA: Diagnosis not present

## 2018-04-08 DIAGNOSIS — I209 Angina pectoris, unspecified: Secondary | ICD-10-CM | POA: Diagnosis present

## 2018-04-08 HISTORY — DX: Coronary angioplasty status: Z98.61

## 2018-04-08 HISTORY — PX: LEFT HEART CATH AND CORONARY ANGIOGRAPHY: CATH118249

## 2018-04-08 HISTORY — DX: Reserved for concepts with insufficient information to code with codable children: IMO0002

## 2018-04-08 HISTORY — DX: Pneumonia, unspecified organism: J18.9

## 2018-04-08 HISTORY — PX: CORONARY STENT INTERVENTION: CATH118234

## 2018-04-08 HISTORY — DX: Coronary angioplasty status: I25.10

## 2018-04-08 HISTORY — PX: CORONARY ANGIOPLASTY WITH STENT PLACEMENT: SHX49

## 2018-04-08 HISTORY — DX: Atherosclerotic heart disease of native coronary artery without angina pectoris: I25.10

## 2018-04-08 LAB — POCT ACTIVATED CLOTTING TIME
ACTIVATED CLOTTING TIME: 257 s
Activated Clotting Time: 268 seconds
Activated Clotting Time: 175 seconds

## 2018-04-08 SURGERY — LEFT HEART CATH AND CORONARY ANGIOGRAPHY
Anesthesia: LOCAL

## 2018-04-08 MED ORDER — IOHEXOL 350 MG/ML SOLN
INTRAVENOUS | Status: DC | PRN
  Administered 2018-04-08: 130 mL via INTRA_ARTERIAL

## 2018-04-08 MED ORDER — CLOPIDOGREL BISULFATE 300 MG PO TABS
ORAL_TABLET | ORAL | Status: AC
  Filled 2018-04-08: qty 1

## 2018-04-08 MED ORDER — SPIRONOLACTONE 25 MG PO TABS
25.0000 mg | ORAL_TABLET | Freq: Every day | ORAL | Status: DC
  Administered 2018-04-08 – 2018-04-09 (×2): 25 mg via ORAL
  Filled 2018-04-08 (×2): qty 1

## 2018-04-08 MED ORDER — SODIUM CHLORIDE 0.9% FLUSH: 3.0000 mL | Freq: Two times a day (BID) | INTRAVENOUS | Status: DC

## 2018-04-08 MED ORDER — MELATONIN 3 MG PO TABS
3.0000 mg | ORAL_TABLET | Freq: Every evening | ORAL | Status: DC | PRN
  Administered 2018-04-08: 3 mg via ORAL
  Filled 2018-04-08 (×2): qty 1

## 2018-04-08 MED ORDER — PANTOPRAZOLE SODIUM 40 MG PO TBEC
40.0000 mg | DELAYED_RELEASE_TABLET | Freq: Every day | ORAL | Status: DC
  Administered 2018-04-08 – 2018-04-09 (×2): 40 mg via ORAL
  Filled 2018-04-08 (×2): qty 1

## 2018-04-08 MED ORDER — LORATADINE 10 MG PO TABS
10.0000 mg | ORAL_TABLET | Freq: Every day | ORAL | Status: DC
  Administered 2018-04-08 – 2018-04-09 (×2): 10 mg via ORAL
  Filled 2018-04-08 (×2): qty 1

## 2018-04-08 MED ORDER — ANGIOPLASTY BOOK
Freq: Once | Status: AC
  Administered 2018-04-08: 22:00:00
  Filled 2018-04-08: qty 1

## 2018-04-08 MED ORDER — HYDRALAZINE HCL 20 MG/ML IJ SOLN
5.0000 mg | INTRAMUSCULAR | Status: AC | PRN
  Administered 2018-04-08: 18:00:00 5 mg via INTRAVENOUS
  Filled 2018-04-08: qty 1

## 2018-04-08 MED ORDER — HYDRALAZINE HCL 20 MG/ML IJ SOLN
INTRAMUSCULAR | Status: DC | PRN
  Administered 2018-04-08: 10 mg via INTRAVENOUS

## 2018-04-08 MED ORDER — METOPROLOL SUCCINATE ER 25 MG PO TB24
25.0000 mg | ORAL_TABLET | Freq: Every day | ORAL | Status: DC
  Administered 2018-04-08 – 2018-04-09 (×2): 25 mg via ORAL
  Filled 2018-04-08 (×2): qty 1

## 2018-04-08 MED ORDER — IRBESARTAN 300 MG PO TABS
300.0000 mg | ORAL_TABLET | Freq: Every day | ORAL | Status: DC
  Administered 2018-04-08 – 2018-04-09 (×2): 300 mg via ORAL
  Filled 2018-04-08 (×2): qty 1

## 2018-04-08 MED ORDER — ASPIRIN 81 MG PO CHEW: 81.0000 mg | CHEWABLE_TABLET | ORAL | Status: DC

## 2018-04-08 MED ORDER — HEPARIN (PORCINE) IN NACL 1000-0.9 UT/500ML-% IV SOLN
INTRAVENOUS | Status: DC | PRN
  Administered 2018-04-08 (×2): 500 mL

## 2018-04-08 MED ORDER — ONDANSETRON HCL 4 MG/2ML IJ SOLN
4.0000 mg | Freq: Four times a day (QID) | INTRAMUSCULAR | Status: DC | PRN
  Administered 2018-04-08: 4 mg via INTRAVENOUS
  Filled 2018-04-08: qty 2

## 2018-04-08 MED ORDER — LIDOCAINE HCL (PF) 1 % IJ SOLN
INTRAMUSCULAR | Status: DC | PRN
  Administered 2018-04-08: 2 mL
  Administered 2018-04-08: 15 mL

## 2018-04-08 MED ORDER — SODIUM CHLORIDE 0.9 % IV SOLN: 250.0000 mL | INTRAVENOUS | Status: DC | PRN

## 2018-04-08 MED ORDER — SODIUM CHLORIDE 0.9 % WEIGHT BASED INFUSION: 1.0000 mL/kg/h | INTRAVENOUS | Status: DC

## 2018-04-08 MED ORDER — SODIUM CHLORIDE 0.9% FLUSH: 3.0000 mL | INTRAVENOUS | Status: DC | PRN

## 2018-04-08 MED ORDER — CLOPIDOGREL BISULFATE 300 MG PO TABS
ORAL_TABLET | ORAL | Status: DC | PRN
  Administered 2018-04-08: 600 mg via ORAL

## 2018-04-08 MED ORDER — CHOLESTYRAMINE 4 G PO PACK
4.0000 g | PACK | Freq: Every day | ORAL | Status: DC
  Administered 2018-04-08: 22:00:00 4 g via ORAL
  Filled 2018-04-08: qty 1

## 2018-04-08 MED ORDER — HYDRALAZINE HCL 20 MG/ML IJ SOLN
INTRAMUSCULAR | Status: AC
  Filled 2018-04-08: qty 1

## 2018-04-08 MED ORDER — VERAPAMIL HCL 2.5 MG/ML IV SOLN
INTRAVENOUS | Status: DC | PRN
  Administered 2018-04-08: 10 mL via INTRA_ARTERIAL

## 2018-04-08 MED ORDER — HEPARIN SODIUM (PORCINE) 1000 UNIT/ML IJ SOLN
INTRAMUSCULAR | Status: AC
  Filled 2018-04-08: qty 1

## 2018-04-08 MED ORDER — LABETALOL HCL 5 MG/ML IV SOLN: 10.0000 mg | INTRAVENOUS | Status: AC | PRN

## 2018-04-08 MED ORDER — AMLODIPINE BESYLATE-VALSARTAN 10-320 MG PO TABS: 1.0000 | ORAL_TABLET | Freq: Every day | ORAL | Status: DC

## 2018-04-08 MED ORDER — AMLODIPINE BESYLATE 10 MG PO TABS
10.0000 mg | ORAL_TABLET | Freq: Every day | ORAL | Status: DC
  Administered 2018-04-08 – 2018-04-09 (×2): 10 mg via ORAL
  Filled 2018-04-08 (×2): qty 1

## 2018-04-08 MED ORDER — HEPARIN SODIUM (PORCINE) 1000 UNIT/ML IJ SOLN
INTRAMUSCULAR | Status: DC | PRN
  Administered 2018-04-08: 2000 [IU] via INTRAVENOUS
  Administered 2018-04-08: 6000 [IU] via INTRAVENOUS

## 2018-04-08 MED ORDER — FENTANYL CITRATE (PF) 100 MCG/2ML IJ SOLN
INTRAMUSCULAR | Status: AC
  Filled 2018-04-08: qty 2

## 2018-04-08 MED ORDER — SODIUM CHLORIDE 0.9 % IV SOLN: INTRAVENOUS | Status: AC

## 2018-04-08 MED ORDER — MIDAZOLAM HCL 2 MG/2ML IJ SOLN
INTRAMUSCULAR | Status: DC | PRN
  Administered 2018-04-08 (×2): 1 mg via INTRAVENOUS

## 2018-04-08 MED ORDER — LIDOCAINE HCL (PF) 1 % IJ SOLN
INTRAMUSCULAR | Status: AC
  Filled 2018-04-08: qty 30

## 2018-04-08 MED ORDER — SODIUM CHLORIDE 0.9 % WEIGHT BASED INFUSION
3.0000 mL/kg/h | INTRAVENOUS | Status: DC
  Administered 2018-04-08: 3 mL/kg/h via INTRAVENOUS

## 2018-04-08 MED ORDER — ACETAMINOPHEN 500 MG PO TABS
500.0000 mg | ORAL_TABLET | Freq: Three times a day (TID) | ORAL | Status: DC | PRN
  Administered 2018-04-08: 500 mg via ORAL
  Filled 2018-04-08: qty 1

## 2018-04-08 MED ORDER — MIDAZOLAM HCL 2 MG/2ML IJ SOLN
INTRAMUSCULAR | Status: AC
  Filled 2018-04-08: qty 2

## 2018-04-08 MED ORDER — NITROGLYCERIN 0.4 MG SL SUBL: 0.4000 mg | SUBLINGUAL_TABLET | SUBLINGUAL | Status: DC | PRN

## 2018-04-08 MED ORDER — CLOPIDOGREL BISULFATE 75 MG PO TABS
75.0000 mg | ORAL_TABLET | Freq: Every day | ORAL | Status: DC
  Administered 2018-04-09: 75 mg via ORAL
  Filled 2018-04-08: qty 1

## 2018-04-08 MED ORDER — VERAPAMIL HCL 2.5 MG/ML IV SOLN
INTRAVENOUS | Status: AC
  Filled 2018-04-08: qty 2

## 2018-04-08 MED ORDER — FAMOTIDINE IN NACL 20-0.9 MG/50ML-% IV SOLN
INTRAVENOUS | Status: AC | PRN
  Administered 2018-04-08: 20 mg via INTRAVENOUS

## 2018-04-08 MED ORDER — NITROGLYCERIN 1 MG/10 ML FOR IR/CATH LAB
INTRA_ARTERIAL | Status: DC | PRN
  Administered 2018-04-08 (×3): 200 ug via INTRACORONARY

## 2018-04-08 MED ORDER — FENTANYL CITRATE (PF) 100 MCG/2ML IJ SOLN
INTRAMUSCULAR | Status: DC | PRN
  Administered 2018-04-08 (×2): 25 ug via INTRAVENOUS

## 2018-04-08 MED ORDER — MORPHINE SULFATE (PF) 2 MG/ML IV SOLN
2.0000 mg | Freq: Once | INTRAVENOUS | Status: AC
  Administered 2018-04-08: 21:00:00 2 mg via INTRAVENOUS
  Filled 2018-04-08: qty 1

## 2018-04-08 MED ORDER — SODIUM CHLORIDE 0.9% FLUSH
3.0000 mL | Freq: Two times a day (BID) | INTRAVENOUS | Status: DC
  Administered 2018-04-08: 3 mL via INTRAVENOUS

## 2018-04-08 MED ORDER — HEPARIN (PORCINE) IN NACL 1000-0.9 UT/500ML-% IV SOLN
INTRAVENOUS | Status: AC
  Filled 2018-04-08: qty 1000

## 2018-04-08 MED ORDER — NITROGLYCERIN 1 MG/10 ML FOR IR/CATH LAB
INTRA_ARTERIAL | Status: AC
  Filled 2018-04-08: qty 10

## 2018-04-08 MED ORDER — ASPIRIN EC 81 MG PO TBEC
81.0000 mg | DELAYED_RELEASE_TABLET | Freq: Every day | ORAL | Status: DC
  Administered 2018-04-09: 09:00:00 81 mg via ORAL
  Filled 2018-04-08: qty 1

## 2018-04-08 SURGICAL SUPPLY — 21 items
BALLN SAPPHIRE 2.0X12 (BALLOONS) ×2
BALLN SAPPHIRE ~~LOC~~ 2.75X12 (BALLOONS) ×2 IMPLANT
BALLOON SAPPHIRE 2.0X12 (BALLOONS) ×1 IMPLANT
CATH INFINITI 5FR MULTPACK ANG (CATHETERS) ×2 IMPLANT
CATH LAUNCHER 6FR JL4 (CATHETERS) ×2 IMPLANT
CATH OPTITORQUE TIG 4.0 5F (CATHETERS) ×2 IMPLANT
DEVICE RAD TR BAND REGULAR (VASCULAR PRODUCTS) ×2 IMPLANT
GLIDESHEATH SLEND A-KIT 6F 22G (SHEATH) ×2 IMPLANT
GUIDEWIRE INQWIRE 1.5J.035X260 (WIRE) ×1 IMPLANT
INQWIRE 1.5J .035X260CM (WIRE) ×2
KIT ENCORE 26 ADVANTAGE (KITS) ×2 IMPLANT
KIT HEART LEFT (KITS) ×2 IMPLANT
PACK CARDIAC CATHETERIZATION (CUSTOM PROCEDURE TRAY) ×2 IMPLANT
SHEATH PINNACLE 5F 10CM (SHEATH) ×2 IMPLANT
SHEATH PINNACLE 6F 10CM (SHEATH) ×2 IMPLANT
STENT SIERRA 2.25 X 15 MM (Permanent Stent) ×2 IMPLANT
TRANSDUCER W/STOPCOCK (MISCELLANEOUS) ×2 IMPLANT
TUBING CIL FLEX 10 FLL-RA (TUBING) ×2 IMPLANT
WIRE ASAHI PROWATER 180CM (WIRE) ×2 IMPLANT
WIRE EMERALD 3MM-J .035X150CM (WIRE) ×2 IMPLANT
WIRE HI TORQ VERSACORE-J 145CM (WIRE) ×2 IMPLANT

## 2018-04-08 NOTE — Research (Signed)
CADFEM Informed Consent   Subject Name: Pamela Dominguez  Subject met inclusion and exclusion criteria.  The informed consent form, study requirements and expectations were reviewed with the subject and questions and concerns were addressed prior to the signing of the consent form.  The subject verbalized understanding of the trail requirements.  The subject agreed to participate in the CADFEM trial and signed the informed consent.  The informed consent was obtained prior to performance of any protocol-specific procedures for the subject.  A copy of the signed informed consent was given to the subject and a copy was placed in the subject's medical record.  Neva Seat 04/08/2018, 11:37 AM

## 2018-04-08 NOTE — Progress Notes (Signed)
TR BAND REMOVAL  LOCATION:    right radial  DEFLATED PER PROTOCOL:    Yes.    TIME BAND OFF / DRESSING APPLIED:    1845   SITE UPON ARRIVAL:    Level 0  SITE AFTER BAND REMOVAL:    Level 1( small bruise)  CIRCULATION SENSATION AND MOVEMENT:    Within Normal Limits   Yes.    COMMENTS:   Tolerated procedure well

## 2018-04-08 NOTE — Progress Notes (Signed)
Site area: right groin  Site Prior to Removal:  Level 0  Pressure Applied For 20 MINUTES    Minutes Beginning at 2004  Manual:   Yes.    Patient Status During Pull:  WNL  Post Pull Groin Site:  Level 0  Post Pull Instructions Given:  Yes.    Post Pull Pulses Present:  Yes.    Dressing Applied:  Yes.    Comments:  Pt.tolerated procedure well

## 2018-04-08 NOTE — Interval H&P Note (Signed)
History and Physical Interval Note:  04/08/2018 12:30 PM  Pamela Dominguez  has presented today for surgery, with the diagnosis of progressive angina with abnormal coronary CTA/FFR.   The various methods of treatment have been discussed with the patient and family. After consideration of risks, benefits and other options for treatment, the patient has consented to  Procedure(s): LEFT HEART CATH AND CORONARY ANGIOGRAPHY (N/A) with possible PERCUTANEOUS CORONARY INTERVENTION   As a surgical intervention .   The patient's history has been reviewed, patient examined, no change in status, stable for surgery.  I have reviewed the patient's chart and labs.  Questions were answered to the patient's satisfaction.    Cath Lab Visit (complete for each Cath Lab visit)  Clinical Evaluation Leading to the Procedure:   ACS: No.  Non-ACS:    Anginal Classification: CCS III  Anti-ischemic medical therapy: Maximal Therapy (2 or more classes of medications)  Non-Invasive Test Results: High-risk stress test findings: cardiac mortality >3%/year -FFR positive coronary CTA of LAD  Prior CABG: No previous CABG  Pamela Dominguez

## 2018-04-09 ENCOUNTER — Encounter (HOSPITAL_COMMUNITY): Payer: Self-pay | Admitting: Cardiology

## 2018-04-09 DIAGNOSIS — Z880 Allergy status to penicillin: Secondary | ICD-10-CM | POA: Diagnosis not present

## 2018-04-09 DIAGNOSIS — E785 Hyperlipidemia, unspecified: Secondary | ICD-10-CM | POA: Diagnosis not present

## 2018-04-09 DIAGNOSIS — K219 Gastro-esophageal reflux disease without esophagitis: Secondary | ICD-10-CM | POA: Diagnosis not present

## 2018-04-09 DIAGNOSIS — I2510 Atherosclerotic heart disease of native coronary artery without angina pectoris: Secondary | ICD-10-CM | POA: Diagnosis not present

## 2018-04-09 DIAGNOSIS — Z888 Allergy status to other drugs, medicaments and biological substances status: Secondary | ICD-10-CM | POA: Diagnosis not present

## 2018-04-09 DIAGNOSIS — R0602 Shortness of breath: Secondary | ICD-10-CM | POA: Diagnosis not present

## 2018-04-09 DIAGNOSIS — I209 Angina pectoris, unspecified: Secondary | ICD-10-CM

## 2018-04-09 DIAGNOSIS — I10 Essential (primary) hypertension: Secondary | ICD-10-CM | POA: Diagnosis not present

## 2018-04-09 DIAGNOSIS — R931 Abnormal findings on diagnostic imaging of heart and coronary circulation: Secondary | ICD-10-CM | POA: Diagnosis not present

## 2018-04-09 DIAGNOSIS — I25118 Atherosclerotic heart disease of native coronary artery with other forms of angina pectoris: Secondary | ICD-10-CM | POA: Diagnosis not present

## 2018-04-09 DIAGNOSIS — Z9861 Coronary angioplasty status: Secondary | ICD-10-CM

## 2018-04-09 DIAGNOSIS — K580 Irritable bowel syndrome with diarrhea: Secondary | ICD-10-CM | POA: Diagnosis not present

## 2018-04-09 DIAGNOSIS — Z7982 Long term (current) use of aspirin: Secondary | ICD-10-CM | POA: Diagnosis not present

## 2018-04-09 DIAGNOSIS — I25110 Atherosclerotic heart disease of native coronary artery with unstable angina pectoris: Secondary | ICD-10-CM | POA: Diagnosis not present

## 2018-04-09 DIAGNOSIS — M8580 Other specified disorders of bone density and structure, unspecified site: Secondary | ICD-10-CM | POA: Diagnosis not present

## 2018-04-09 DIAGNOSIS — Z79899 Other long term (current) drug therapy: Secondary | ICD-10-CM | POA: Diagnosis not present

## 2018-04-09 LAB — BASIC METABOLIC PANEL
ANION GAP: 9 (ref 5–15)
BUN: 8 mg/dL (ref 8–23)
CO2: 23 mmol/L (ref 22–32)
Calcium: 8.7 mg/dL — ABNORMAL LOW (ref 8.9–10.3)
Chloride: 102 mmol/L (ref 98–111)
Creatinine, Ser: 0.52 mg/dL (ref 0.44–1.00)
GFR calc Af Amer: >60 mL/min (ref >60)
GFR calc non Af Amer: >60 mL/min (ref >60)
Glucose, Bld: 107 mg/dL — ABNORMAL HIGH (ref 70–99)
POTASSIUM: 3.8 mmol/L (ref 3.5–5.1)
Sodium: 134 mmol/L — ABNORMAL LOW (ref 135–145)

## 2018-04-09 LAB — CBC
HEMATOCRIT: 37 % (ref 36.0–46.0)
HEMOGLOBIN: 12.5 g/dL (ref 12.0–15.0)
MCH: 30.3 pg (ref 26.0–34.0)
MCHC: 33.8 g/dL (ref 30.0–36.0)
MCV: 89.8 fL (ref 78.0–100.0)
Platelets: 184 10*3/uL (ref 150–400)
RBC: 4.12 MIL/uL (ref 3.87–5.11)
RDW: 12.8 % (ref 11.5–15.5)
WBC: 6.6 10*3/uL (ref 4.0–10.5)

## 2018-04-09 MED ORDER — PANTOPRAZOLE SODIUM 40 MG PO TBEC: 40.0000 mg | DELAYED_RELEASE_TABLET | Freq: Every day | ORAL | 6 refills | Status: AC

## 2018-04-09 MED ORDER — ATORVASTATIN CALCIUM 40 MG PO TABS: 40.0000 mg | ORAL_TABLET | Freq: Every day | ORAL | 4 refills | Status: DC

## 2018-04-09 MED ORDER — ATORVASTATIN CALCIUM 40 MG PO TABS: 40.0000 mg | ORAL_TABLET | Freq: Every day | ORAL | 6 refills | Status: DC

## 2018-04-09 MED ORDER — CLOPIDOGREL BISULFATE 75 MG PO TABS: 75.0000 mg | ORAL_TABLET | Freq: Every day | ORAL | 11 refills | Status: DC

## 2018-04-09 MED ORDER — ATORVASTATIN CALCIUM 20 MG PO TABS: 20.0000 mg | ORAL_TABLET | Freq: Every day | ORAL | Status: DC

## 2018-04-09 MED ORDER — ATORVASTATIN CALCIUM 40 MG PO TABS: 40.0000 mg | ORAL_TABLET | Freq: Every day | ORAL | Status: DC

## 2018-04-09 MED FILL — PANTOPRAZOLE SOD DR 40 MG T: 40 | 30 days supply | Qty: 30 | Fill #0

## 2018-04-09 MED FILL — CLOPIDOGREL 75 MG TABLET: 75 | 30 days supply | Qty: 30 | Fill #0

## 2018-04-09 MED FILL — ATORVASTATIN CALCIUM 40 MG: 40 | 30 days supply | Qty: 30 | Fill #0

## 2018-04-09 NOTE — Progress Notes (Signed)
CARDIAC REHAB PHASE I   PRE:  Rate/Rhythm: 70 SR with PVcs    BP: sitting 140/55    SaO2:   MODE:  Ambulation: 1000 ft   POST:  Rate/Rhythm: 102 ST with PVC    BP: sitting 179/52     SaO2:   Pt ambulated long distance, did have some SOB but denies CP. Eager to exercise at home. Ed completed. Will refer to [place]. She understands Plavix/ASA.  [place], ACSM 04/09/2018 8:54 AM

## 2018-04-09 NOTE — Discharge Summary (Addendum)
Discharge Summary    Patient ID: Pamela Dominguez MRN: [id number]; DOB: Aug 23, 1932  Admit date: 04/08/2018 Discharge date: 04/09/2018  Primary Care Provider: [name]., MD  Primary Cardiologist: [name], MD  Primary Electrophysiologist:  None   Discharge Diagnoses    Principal Problem:   Abnormal cardiac CT angiography Active Problems:   CAD S/P percutaneous coronary angioplasty and stent DES mLAD beyond 2nd diag   Essential hypertension   Hyperlipidemia LDL goal <70   SOB (shortness of breath)   Angina, class III (HCC)   Coronary artery disease of native artery of native heart with stable angina pectoris (HCC)   Allergies Allergies  Allergen Reactions  . Actonel [Risedronate Sodium] Other (See Comments)    "felt like I was choking"  . Alendronate Nausea And Vomiting  . Nizatidine Itching and Rash    Axid-Brand Name  . Penicillins Rash    Has patient had a PCN reaction causing immediate rash, facial/tongue/throat swelling, SOB or lightheadedness with hypotension: Yes Has patient had a PCN reaction causing severe rash involving mucus membranes or skin necrosis: No Has patient had a PCN reaction that required hospitalization: No Has patient had a PCN reaction occurring within the last 10 years: No If all of the above answers are "NO", then may proceed with Cephalosporin use.   . Quinine Itching and Rash    Diagnostic Studies/Procedures    Cardiac cath 04/08/18  Cardiac cath and PCI 04/08/18   CULPRIT LESION: Mid LAD lesion is 80% stenosed.  A drug-eluting stent was successfully placed using a STENT SIERRA 2.25 X 15 MM. Postdilated to 2.8 mm.  Post intervention, there is a 0% residual stenosis.  Otherwise essentially normal coronary arteries  The left ventricular systolic function is normal. LVEF 55-65% by visual estimate.  LV end diastolic pressure is normal.   Severe single-vessel disease involving the mid LAD beyond 2nd Diag = 80% lesion treated  with Xience Sierra DES 2.25 mm x 15 mm postdilated to 2.8 mm)  Otherwise essentially normal coronaries with minimal disease.  Normal LV function with EDP  Plan: Unfortunately since we had to switch to femoral access, she will monitor tonight post PCI with sheath removal. Will likely need PRN's for hypertension. Restart post blood pressure medications with exception of beta-blocker tomorrow.  Anticipate discharge tomorrow.  Recommend uninterrupted dual antiplatelet therapy with Aspirin 81mg  daily and Clopidogrel 75mg  daily for a minimum of 6 months (stable ischemic heart disease - Class I recommendation).Would be able to stop aspirin after 3-6 months. Without contraindication would recommend continuing clopidogrel for 12 months, but after 6 months could hold for procedures.  _____________   History of Present Illness     84 yoF with hx of HTN, Chest pain, DOE, and with these symptoms cardiac CTA was ordered with flow limiting stenosis in prox LAD.  Pt was admitted for cardiac cath and PCI was competed.  Pt kept overnight.    Hospital Course     Consultants: none   Post cardiac cath pt did well.  No chest pain or sob. She does have PVCs on monitor, normal EF and is on BB.  She had imdur added to meds. Prior to procedure we will stop for only singe vessel disease.    This am has walked with cardiac rehab and done well.  She has been seen and evaluated with Dr. Debara Pickett and found stable for discharge.   _____________  Discharge Vitals Blood pressure (!) 140/55, pulse 71, temperature 97.6 F (  36.4 C), temperature source Oral, resp. rate (!) 23, height 5\' 1"  (1.549 m), weight 62 kg, SpO2 97 %.  Filed Weights   04/08/18 1024 04/09/18 0613  Weight: 62.6 kg 62 kg    Labs & Radiologic Studies    CBC Recent Labs    04/09/18 0205  WBC 6.6  HGB 12.5  HCT 37.0  MCV 89.8  PLT 272   Basic Metabolic Panel Recent Labs    04/09/18 0205  NA 134*  K 3.8  CL 102  CO2 23    GLUCOSE 107*  BUN 8  CREATININE 0.52  CALCIUM 8.7*   Liver Function Tests No results for input(s): AST, ALT, ALKPHOS, BILITOT, PROT, ALBUMIN in the last 72 hours. No results for input(s): LIPASE, AMYLASE in the last 72 hours. Cardiac Enzymes No results for input(s): CKTOTAL, CKMB, CKMBINDEX, TROPONINI in the last 72 hours. BNP Invalid input(s): POCBNP D-Dimer No results for input(s): DDIMER in the last 72 hours. Hemoglobin A1C No results for input(s): HGBA1C in the last 72 hours. Fasting Lipid Panel No results for input(s): CHOL, HDL, LDLCALC, TRIG, CHOLHDL, LDLDIRECT in the last 72 hours. Thyroid Function Tests No results for input(s): TSH, T4TOTAL, T3FREE, THYROIDAB in the last 72 hours.  Invalid input(s): FREET3 _____________  Ct Coronary Morph W/cta Cor W/score W/ca W/cm &/or Wo/cm  Addendum Date: 03/28/2018   ADDENDUM REPORT: 03/28/2018 09:14 EXAM: OVER-READ INTERPRETATION CT CHEST The following report is an over-read performed by radiologist Dr. Evangeline Dakin of [facility], [place] on 03/28/2018. This over-read does not include interpretation of cardiac or coronary anatomy or pathology. The coronary CTA interpretation by the cardiologist is attached. COMPARISON:  CT chest 01/26/2018. FINDINGS: Vascular: Mild atherosclerosis involving the descending thoracic aorta. No evidence of aortic aneurysm. Mediastinum/Nodes: No pathologic lymphadenopathy within the visualized mediastinum. Visualized esophagus normal in appearance. Lungs/Pleura: Scarring associated with dystrophic calcification involving the RIGHT MIDDLE LOBE, unchanged. Minimal scarring in the LEFT LOWER LOBE. Stable 4 mm subpleural nodule laterally in the LEFT LOWER LOBE (series 13, image 24). Visualized lung parenchyma otherwise clear. No pleural effusions. Visualized central airways patent without significant bronchial wall thickening. Chest Wall: Normal. Upper Abdomen: Small hiatal hernia containing fat. The stomach  remains located below the diaphragm. Interposition of the hepatic flexure of the colon between the liver and the right hemidiaphragm. No significant abnormalities in the visualized upper abdomen allowing for the early arterial phase of enhancement. Musculoskeletal: Visualized skeleton unremarkable. IMPRESSION: 1.  Aortic Atherosclerosis, mild.  (ICD10-170.0) 2. No significant extracardiac abnormalities otherwise. Electronically Signed   By: Evangeline Dakin M.D.   On: 03/28/2018 09:14   Result Date: 03/28/2018 CLINICAL DATA:  84 year old female with typical chest pain. EXAM: Cardiac/Coronary  CT TECHNIQUE: The patient was scanned on a [employer]. FINDINGS: A 120 kV prospective scan was triggered in the descending thoracic aorta at 111 HU's. Axial non-contrast 3 mm slices were carried out through the heart. The data set was analyzed on a dedicated work station and scored using the [place]. Gantry rotation speed was 250 msecs and collimation was .6 mm. 10 mg of iv Metoprolol and 0.8 mg of sl NTG was given. The 3D data set was reconstructed in 5% intervals of the 67-82 % of the R-R cycle. Diastolic phases were analyzed on a dedicated work station using MPR, MIP and VRT modes. The patient received 80 cc of contrast. Aorta:  Normal size.  No calcifications.  No dissection. Aortic Valve:  Trileaflet.  No calcifications. Coronary  Arteries:  Normal coronary origin.  Right dominance. RCA is a large dominant artery that gives rise to PDA and PLVB. There is mild diffuse plaque with maximum stenosis 25-50%. Left main is a very short artery that gives rise to LAD, ramus intermedius and has minimal plaque. LAD is a large vessel that gives rise to two diagonal arteries. There is mild calcified plaque in the proximal portion with stenosis 25-50%, mid LAD has moderate non-calcified plaque with suspicion for a stenosis > 70%. D1, D2 have minimal plaque. LCX is a non-dominant artery that gives rise to one OM1  branch. There is mild plaque. Other findings: Normal pulmonary vein drainage into the left atrium. Normal let atrial appendage without a thrombus. IMPRESSION: 1. Coronary calcium score of 218. This was 11 percentile for age and sex matched control. 2. Normal coronary origin with right dominance. 3. Mild diffuse CAD and suspicion for a severe stenosis in the mid LAD. Additional analysis with CT FFR will be submitted. Electronically Signed: By: Ena Dawley On: 03/27/2018 18:34   Ct Coronary Fractional Flow Reserve Fluid Analysis  Result Date: 03/31/2018 EXAM: FF/RCT ANALYSIS FINDINGS: FFRct analysis was performed on the original cardiac CT angiogram dataset. Diagrammatic representation of the FFRct analysis is provided in a separate PDF document in PACS. This dictation was created using the PDF document and an interactive 3D model of the results. 3D model is not available in the EMR/PACS. Normal FFR range is >0.80. 1. Left Main:  No significant stenosis. 2. LAD: Proximal LAD: 0.96, mid LAD 0.73. 3. LCX: No significant stenosis. 4. RCA: No significant stenosis. IMPRESSION: 1. CT FFR analysis showed severe stenosis in the mid LAD. A cardiac catheterization is recommended. Electronically Signed   By: Ena Dawley   On: 03/31/2018 10:06   Disposition   Pt is being discharged home today in good condition.  Follow-up Plans & Appointments   Call [facility] at [phone] or Dr. Joya Gaskins office if any bleeding, swelling or drainage at cath site.  May shower, no tub baths for 48 hours for groin sticks. No lifting over 5 pounds for 3 days.  No Driving for 3 days  Heart healthy Diet.  Do not stop asprin or plavix, they are helping to keep stent open.   We changed your prilosec to protonix, the prilosec may interact with plavix.     Follow-up Information    Richardo Priest, MD Follow up on 05/19/2018.   Specialty:  Cardiology Why:  at 9:40 am Contact information: [place] [address] [phone]          Discharge Instructions    Amb Referral to Cardiac Rehabilitation   Complete by:  As directed    Diagnosis:   Coronary Stents PTCA        Discharge Medications   Allergies as of 04/09/2018      Reactions   Actonel [risedronate Sodium] Other (See Comments)   "felt like I was choking"   Alendronate Nausea And Vomiting   Nizatidine Itching, Rash   Axid-Brand Name   Penicillins Rash   Has patient had a PCN reaction causing immediate rash, facial/tongue/throat swelling, SOB or lightheadedness with hypotension: Yes Has patient had a PCN reaction causing severe rash involving mucus membranes or skin necrosis: No Has patient had a PCN reaction that required hospitalization: No Has patient had a PCN reaction occurring within the last 10 years: No If all of the above answers are "NO", then  may proceed with Cephalosporin use.   Quinine Itching, Rash      Medication List    STOP taking these medications   isosorbide mononitrate 30 MG 24 hr tablet Commonly known as:  IMDUR   omeprazole 20 MG capsule Commonly known as:  PRILOSEC Replaced by:  pantoprazole 40 MG tablet     TAKE these medications   acetaminophen 650 MG CR tablet Commonly known as:  TYLENOL Take 650 mg by mouth daily as needed for pain.   aspirin EC 81 MG tablet Take 81 mg by mouth daily.   atorvastatin 40 MG tablet Commonly known as:  LIPITOR Take 1 tablet (40 mg total) by mouth daily at 6 PM.   b complex vitamins tablet Take 1 tablet by mouth daily.   Biotin 10000 MCG Tabs Take 10,000 mcg by mouth daily.   cetirizine 10 MG tablet Commonly known as:  ZYRTEC Take 10 mg by mouth daily.   cholestyramine 4 g packet Commonly known as:  QUESTRAN Take 4 g by mouth at bedtime.   clopidogrel 75 MG tablet Commonly known as:  PLAVIX Take 1 tablet (75 mg total) by mouth daily with breakfast. Start taking on:  04/10/2018   EXFORGE 10-320 MG  tablet Generic drug:  amLODipine-valsartan Take 1 tablet by mouth daily.   glucosamine-chondroitin 500-400 MG tablet Take 1 tablet by mouth 2 (two) times daily.   loperamide 2 MG tablet Commonly known as:  IMODIUM A-D Take 4 mg by mouth as needed for diarrhea or loose stools.   Melatonin 5 MG Caps Take 5 mg by mouth at bedtime as needed (sleep).   metoprolol succinate 25 MG 24 hr tablet Commonly known as:  TOPROL-XL Take 1 tablet (25 mg total) by mouth daily.   multivitamin tablet Take 1 tablet by mouth daily.   nitroGLYCERIN 0.4 MG SL tablet Commonly known as:  NITROSTAT Place 1 tablet (0.4 mg total) under the tongue every 5 (five) minutes as needed for chest pain.   OCUVITE PO Take 1 tablet by mouth daily.   pantoprazole 40 MG tablet Commonly known as:  PROTONIX Take 1 tablet (40 mg total) by mouth daily. Start taking on:  04/10/2018 Replaces:  omeprazole 20 MG capsule   spironolactone 25 MG tablet Commonly known as:  ALDACTONE Take 25 mg by mouth daily.   SYSTANE OP Place 1 drop into both eyes daily as needed (dry eyes).        Acute coronary syndrome (MI, NSTEMI, STEMI, etc) this admission?: No.    Outstanding Labs/Studies   Lipids and hepatic in 6 weeks.    Duration of Discharge Encounter   Greater than 30 minutes including physician time.  Signed, Cecilie Kicks, NP 04/09/2018, 11:39 AM

## 2018-04-09 NOTE — Progress Notes (Signed)
Progress Note  Patient Name: Pamela Dominguez Date of Encounter: 04/09/2018  Primary Cardiologist: [name], MD   Subjective   No chest pain or SOB, no palpitations   + PVCs on EKG  Inpatient Medications    Scheduled Meds: . amLODipine  10 mg Oral Daily   And  . irbesartan  300 mg Oral Daily  . aspirin EC  81 mg Oral Daily  . atorvastatin  20 mg Oral q1800  . cholestyramine  4 g Oral QHS  . clopidogrel  75 mg Oral Q breakfast  . loratadine  10 mg Oral Daily  . metoprolol succinate  25 mg Oral Daily  . pantoprazole  40 mg Oral Daily  . sodium chloride flush  3 mL Intravenous Q12H  . spironolactone  25 mg Oral Daily   Continuous Infusions: . sodium chloride     PRN Meds: sodium chloride, acetaminophen, Melatonin, nitroGLYCERIN, ondansetron (ZOFRAN) IV, sodium chloride flush   Vital Signs    Vitals:   04/08/18 2100 04/08/18 2102 04/09/18 0613 04/09/18 0756  BP: (!) 133/57  138/74 (!) 140/55  Pulse: 70 70 (!) 57 71  Resp: 15 19 19  (!) 23  Temp:   98.5 F (36.9 C) 97.6 F (36.4 C)  TempSrc:   Oral Oral  SpO2: 97% 97% 97% 97%  Weight:   62 kg   Height:        Intake/Output Summary (Last 24 hours) at 04/09/2018 0921 Last data filed at 04/09/2018 7106 Gross per 24 hour  Intake 882.24 ml  Output 1100 ml  Net -217.76 ml   Filed Weights   04/08/18 1024 04/09/18 0613  Weight: 62.6 kg 62 kg    Telemetry    SR with PVCs - Personally Reviewed  ECG    SR at 61 no acute changes - Personally Reviewed  Physical Exam   GEN: No acute distress.   Neck: No JVD Cardiac: RRR, no murmurs, rubs, or gallops. Rt wrist without bleeding, no hematoma  Respiratory: Clear to auscultation bilaterally. GI: Soft, nontender, non-distended  MS: No edema; No deformity. Neuro:  Nonfocal  Psych: Normal affect   Labs    Chemistry Recent Labs  Lab 04/02/18 1115 04/09/18 0205  NA 137 134*  K 4.4 3.8  CL 99 102  CO2 22 23  GLUCOSE 99 107*  BUN 11 8  CREATININE 0.55*  0.52  CALCIUM 9.7 8.7*  GFRNONAA 88 >60  GFRAA 101 >60  ANIONGAP  --  9     Hematology Recent Labs  Lab 04/02/18 1115 04/09/18 0205  WBC 3.8 6.6  RBC 4.48 4.12  HGB 13.8 12.5  HCT 39.5 37.0  MCV 88 89.8  MCH 30.8 30.3  MCHC 34.9 33.8  RDW 13.3 12.8  PLT 208 184    Cardiac EnzymesNo results for input(s): TROPONINI in the last 168 hours. No results for input(s): TROPIPOC in the last 168 hours.   BNPNo results for input(s): BNP, PROBNP in the last 168 hours.   DDimer No results for input(s): DDIMER in the last 168 hours.   Radiology    No results found.  Cardiac Studies  Cardiac cath and PCI 04/08/18   CULPRIT LESION: Mid LAD lesion is 80% stenosed.  A drug-eluting stent was successfully placed using a STENT SIERRA 2.25 X 15 MM. Postdilated to 2.8 mm.  Post intervention, there is a 0% residual stenosis.  Otherwise essentially normal coronary arteries  The left ventricular systolic function is normal. LVEF 55-65%  by visual estimate.  LV end diastolic pressure is normal.    Severe single-vessel disease involving the mid LAD beyond 2nd Diag = 80% lesion treated with Xience Sierra DES 2.25 mm x 15 mm postdilated to 2.8 mm)  Otherwise essentially normal coronaries with minimal disease.  Normal LV function with EDP  Plan: Unfortunately since we had to switch to femoral access, she will monitor tonight post PCI with sheath removal. Will likely need PRN's for hypertension. Restart post blood pressure medications with exception of beta-blocker tomorrow.  Anticipate discharge tomorrow.  Recommend uninterrupted dual antiplatelet therapy with Aspirin 81mg  daily and Clopidogrel 75mg  daily for a minimum of 6 months (stable ischemic heart disease - Class I recommendation).  Would be able to stop aspirin after 3-6 months.  Without contraindication would recommend continuing clopidogrel for 12 months, but after 6 months could hold for procedures.   Patient Profile       84 y.o. female hx of hypertension chest pain exertional shortness of breath 02/11/2018 last seen.  At that time she was found to have class III typical angina pectoris was referred for cardiac CTA which shows flow-limiting stenosis in the proximal left anterior descending coronary artery.  Assessment & Plan    Class III angina and abnormal cardiac CT with flow limiting stenosis to pLAD.  And reduced FFR.  Cath planned  CAD with single vessel to LAD.   S/p DES to mLAD - on asa and plavix, also on BB and have added statin -will need outpt lipid panel in 6 weeks.  --ambulated with rehab with mild SOB no chest pain.  Plan to refer to [facility] add statin   HTN elevated this AM to 970 systolic but now 263 systolic.     For questions or updates, please contact [place] Please consult [email or web address] for contact info under        Signed, Cecilie Kicks, NP  04/09/2018, 9:21 AM

## 2018-04-09 NOTE — Discharge Instructions (Signed)
Call [facility] at [phone] or Dr. Joya Gaskins office if any bleeding, swelling or drainage at cath site.  May shower, no tub baths for 48 hours for groin sticks. No lifting over 5 pounds for 3 days.  No Driving for 3 days  Heart healthy Diet.  Do not stop asprin or plavix, they are helping to keep stent open.   We changed your prilosec to protonix, the prilosec may interact with plavix.

## 2018-04-13 ENCOUNTER — Telehealth: Payer: Self-pay | Admitting: Cardiology

## 2018-04-13 NOTE — Telephone Encounter (Signed)
Had cath in arm and site is sore

## 2018-04-13 NOTE — Telephone Encounter (Signed)
Left message to return call 

## 2018-04-13 NOTE — Telephone Encounter (Signed)
Patient agreeable to nurse visit to look at right radial site. Patient has been scheduled in the [place] office tomorrow, 04/14/18 at 11:00 am. Patient verbalized understanding. No further questions.

## 2018-04-13 NOTE — Telephone Encounter (Signed)
Nurse visit

## 2018-04-13 NOTE — Telephone Encounter (Signed)
Patient states up until last night she has had no pain or issues with the right radial site where they attempted to go through for her heart catheterization on Wednesday, 04/08/18. Last night, she reports mild pain from the radial site almost up to her shoulder that is consistent and a small knot right below the site. She took some tylenol before bed and was able to sleep. Her symptoms still persist today. Will have Dr. Bettina Gavia advise.

## 2018-04-14 ENCOUNTER — Ambulatory Visit (INDEPENDENT_AMBULATORY_CARE_PROVIDER_SITE_OTHER): Payer: PPO | Admitting: Cardiology

## 2018-04-14 VITALS — BP 138/70 | HR 60 | Resp 16 | Ht 61.0 in | Wt 140.8 lb

## 2018-04-14 DIAGNOSIS — I2510 Atherosclerotic heart disease of native coronary artery without angina pectoris: Secondary | ICD-10-CM

## 2018-04-14 DIAGNOSIS — Z9861 Coronary angioplasty status: Secondary | ICD-10-CM

## 2018-04-14 NOTE — Patient Instructions (Addendum)
Medication Instructions:  Your physician recommends that you continue on your current medications as directed. Please refer to the Current Medication list given to you today.   Labwork: None.  Testing/Procedures: None.  Follow-Up: Follow up as previously directed.   Any Other Special Instructions Will Be Listed Below (If Applicable). Dr. Geraldo Pitter recommends cold compress, elevation, and to monitor this. If it doesn't get better by the end of this week please contact the office back.   If you need a refill on your cardiac medications before your next appointment, please call your pharmacy.

## 2018-04-14 NOTE — Progress Notes (Signed)
Patient presenting with right wrist/arm pain and numbness that started on 04/12/18. Patient had a cardiac catheterization done on 04/08/18, they attempted access through her right wrist initially but ended up having to go through her groin. Patient noticed a "lump" along with the pain and numbness to her right wrist and right arm area on 04/12/18. Dr. Geraldo Pitter assessed and recommended cold compress, elevation, and monitor this. If it doesn't get better by the end of this week patient has been advised to contact the office back. Patient verbally understands.

## 2018-04-20 ENCOUNTER — Telehealth: Payer: Self-pay | Admitting: Cardiology

## 2018-04-20 ENCOUNTER — Telehealth: Payer: Self-pay | Admitting: Cardiovascular Disease

## 2018-04-20 NOTE — Telephone Encounter (Signed)
Returned patient's call regarding Protonix. Advised patient that the [facility] prescribed this medication and she would have to reach out to them to make further changes to medication for indigestion. Phone number provided to patient. No further questions.

## 2018-04-20 NOTE — Telephone Encounter (Signed)
See her PCP

## 2018-04-20 NOTE — Telephone Encounter (Signed)
Patient called back stating that the [facility] had no idea what she was talking about. Advised patient to call her PCP, Dr. Bea Graff, regarding medication changes. Patient verbalized understanding. No further questions.

## 2018-04-20 NOTE — Telephone Encounter (Signed)
Patient called and states that the new med we put her on for Indigestion is not working and would like to discuss possibly changing to something new.. She may be in and out so if not home call the Cell# please.

## 2018-04-20 NOTE — Telephone Encounter (Signed)
Please advise if you would like to make medication changes for acid reflux or if she should reach out to her PCP, Dr. Bea Graff. Thanks!

## 2018-04-20 NOTE — Telephone Encounter (Signed)
Patient advised to reach out to her PCP regarding medication changes for acid reflux per Dr. Bettina Gavia. No further questions.

## 2018-04-20 NOTE — Telephone Encounter (Signed)
New Message:    Pt c/o medication issue:  1. Name of Medication: pantoprazole (PROTONIX) 40 MG tablet  2. How are you currently taking this medication (dosage and times per day)? Take 1 tablet (40 mg total) by mouth daily.  3. Are you having a reaction (difficulty breathing--STAT)? No   4. What is your medication issue? Patient stated the medication is not helping with her acid reflux

## 2018-04-28 DIAGNOSIS — Z23 Encounter for immunization: Secondary | ICD-10-CM | POA: Diagnosis not present

## 2018-04-29 ENCOUNTER — Other Ambulatory Visit: Payer: Self-pay | Admitting: *Deleted

## 2018-04-29 ENCOUNTER — Other Ambulatory Visit: Payer: Self-pay | Admitting: Cardiology

## 2018-04-29 DIAGNOSIS — E785 Hyperlipidemia, unspecified: Secondary | ICD-10-CM | POA: Diagnosis not present

## 2018-04-29 DIAGNOSIS — Z955 Presence of coronary angioplasty implant and graft: Secondary | ICD-10-CM | POA: Diagnosis not present

## 2018-04-29 DIAGNOSIS — Z79899 Other long term (current) drug therapy: Secondary | ICD-10-CM | POA: Diagnosis not present

## 2018-04-29 DIAGNOSIS — M1990 Unspecified osteoarthritis, unspecified site: Secondary | ICD-10-CM | POA: Diagnosis not present

## 2018-04-29 DIAGNOSIS — I10 Essential (primary) hypertension: Secondary | ICD-10-CM | POA: Diagnosis not present

## 2018-04-29 MED ORDER — PANTOPRAZOLE SODIUM 40 MG PO TBEC: 40.0000 mg | DELAYED_RELEASE_TABLET | Freq: Every day | ORAL | 9 refills | Status: DC

## 2018-04-29 MED ORDER — CLOPIDOGREL BISULFATE 75 MG PO TABS: 75.0000 mg | ORAL_TABLET | Freq: Every day | ORAL | 9 refills | Status: DC

## 2018-05-18 NOTE — Progress Notes (Signed)
Cardiology Office Note:    Date:  05/19/2018   ID:  Pamela Dominguez, DOB [birth year], MRN [id number]  PCP:  [name]., MD  Cardiologist:  [name], MD    Referring MD: [name]., MD    ASSESSMENT:    1. Coronary artery disease of native artery of native heart with stable angina pectoris ([place])   2. Essential hypertension   3. Hyperlipidemia LDL goal <70   4. Gastroesophageal reflux disease, esophagitis presence not specified    PLAN:    In order of problems listed above:  1. Stable after recent PCI and stent LAD without acute coronary syndrome.  She will continue current treatment including dual antiplatelet for 1 year uninterrupted.  Presently in cardiac rehabilitation continue.  She is having shortness of breath and cough has a background history of asthma and I will ask her to discontinue her low-dose beta-blocker.  She will monitor her heart rate at home contacted for right greater than 90 at rest 2. Stable blood pressure target continue current treatment 3. Statin therapy was new in July check liver function lipid profile 4. Continues to have symptoms of reflux she is due to see her primary care physician unhappy that she was switched from Prilosec to Protonix and clopidogrel interaction and I asked her to add Maalox TC 1 teaspoon after meals and bedtime.   Next appointment: 6 months   Medication Adjustments/Labs and Tests Ordered: Current medicines are reviewed at length with the patient today.  Concerns regarding medicines are outlined above.  No orders of the defined types were placed in this encounter.  No orders of the defined types were placed in this encounter.   Chief Complaint  Patient presents with  . Follow-up  . Congestive Heart Failure  . Shortness of Breath    History of Present Illness:    Pamela Dominguez is a 84 y.o. female with a hx of hypertension chest pain exertional shortness of breath  02/11/2018  seen.  At that time she was found to  have class III typical angina pectoris was referred for cardiac CTA which shows flow-limiting stenosis in the proximal left anterior descending coronary artery. Coronary angiography 04/08/18 showed severe LAD stenosis with PCI and DES performed.  She was  last seen 04/02/18. Compliance with diet, lifestyle and medications: yes Past Medical History:  Diagnosis Date  . Abnormal CXR 01/21/2018  . Arthritis    "all over" (04/08/2018)  . CAD S/P percutaneous coronary angioplasty and stent DES mLAD beyond 2nd diag 04/08/2018  . Chest pain 01/22/2018  . Chronic edema 05/19/2017  . Chronic lower back pain   . Complication of anesthesia 1989   mouth joint trouble after hysterctomy, told by dentist to haver anesthesia use bite block to keep mouth open same amount all over  . Coronary artery disease   . Diverticulosis   . Diverticulosis of colon 09/28/2013  . Diverticulosis of colon (without mention of hemorrhage) 09/28/2013  . DJD (degenerative joint disease)   . Essential hypertension 09/18/2015   Last Assessment & Plan:  Relevant Hx: Course: Daily Update: Today's Plan:this is stable for her at this time and will follow and on repeat she was much better overall Electronically signed by: Mayer Camel, NP 09/18/15 1146  . Family history of anesthesia complication    brother low bp, heart problems, ileus  . GERD (gastroesophageal reflux disease)   . High risk medication use 09/18/2015  . History of diverticulosis 08/28/2017  .  Hyperlipidemia   . Hyperlipidemia, unspecified 09/18/2015   Last Assessment & Plan:  Update her lipids for her fasting  . Hypertension   . IBS (irritable bowel syndrome)   . Irritable bowel syndrome 09/28/2013  . Irritable bowel syndrome with both constipation and diarrhea 09/18/2015   Last Assessment & Plan:  She has more fecal incontinence and she has to wear a pad and that is part of her UTI issues, though the Lucrezia Starch has helped her about 75% to diminish this and for  that she is pleased.  . OA (osteoarthritis) of knee 09/27/2013  . Osteopenia   . Other and unspecified hyperlipidemia 09/28/2013  . Pneumonia 1955  . Squamous carcinoma ~ 2009   "right ear"  . TMJ (dislocation of temporomandibular joint)   . Unspecified essential hypertension 09/28/2013    Past Surgical History:  Procedure Laterality Date  . ABDOMINAL HYSTERECTOMY  1989  . APPENDECTOMY  1948  . CHOLECYSTECTOMY  1999  . COLONOSCOPY  2013  . CORONARY ANGIOPLASTY WITH STENT PLACEMENT  04/08/2018  . CORONARY STENT INTERVENTION N/A 04/08/2018   Procedure: CORONARY STENT INTERVENTION;  Surgeon: Leonie Man, MD;  Location: [facility];  Service: Cardiovascular;  Laterality: N/A;  . JOINT REPLACEMENT    . KNEE ARTHROSCOPY Left 1996  . LEFT HEART CATH AND CORONARY ANGIOGRAPHY N/A 04/08/2018   Procedure: LEFT HEART CATH AND CORONARY ANGIOGRAPHY;  Surgeon: Leonie Man, MD;  Location: [facility];  Service: Cardiovascular;  Laterality: N/A;  . NASAL SINUS SURGERY  1968  . SQUAMOUS CELL CARCINOMA EXCISION Right    "ear"  . TONSILLECTOMY AND ADENOIDECTOMY  1942  . TOTAL KNEE ARTHROPLASTY Right 09/27/2013   Procedure: RIGHT TOTAL KNEE ARTHROPLASTY;  Surgeon: Gearlean Alf, MD;  Location: [facility];  Service: Orthopedics;  Laterality: Right;    Current Medications: Current Meds  Medication Sig  . acetaminophen (TYLENOL) 650 MG CR tablet Take 650 mg by mouth daily as needed for pain.  Marland Kitchen amLODipine-valsartan (EXFORGE) 10-320 MG tablet Take 1 tablet by mouth daily.  Marland Kitchen aspirin EC 81 MG tablet Take 81 mg by mouth daily.  Marland Kitchen atorvastatin (LIPITOR) 40 MG tablet Take 1 tablet (40 mg total) by mouth daily at 6 PM.  . b complex vitamins tablet Take 1 tablet by mouth daily.  . Biotin 10000 MCG TABS Take 10,000 mcg by mouth daily.  . cetirizine (ZYRTEC) 10 MG tablet Take 10 mg by mouth daily.  . cholestyramine (QUESTRAN) 4 G packet Take 4 g by mouth at bedtime.  . clopidogrel (PLAVIX) 75  MG tablet Take 1 tablet (75 mg total) by mouth daily with breakfast.  . glucosamine-chondroitin 500-400 MG tablet Take 1 tablet by mouth 2 (two) times daily.  Marland Kitchen loperamide (IMODIUM A-D) 2 MG tablet Take 4 mg by mouth as needed for diarrhea or loose stools.  . Melatonin 5 MG CAPS Take 5 mg by mouth at bedtime as needed (sleep).  . metoprolol succinate (TOPROL XL) 25 MG 24 hr tablet Take 1 tablet (25 mg total) by mouth daily.  . Multiple Vitamin (MULTIVITAMIN) tablet Take 1 tablet by mouth daily.  . Multiple Vitamins-Minerals (OCUVITE PO) Take 1 tablet by mouth daily.  . nitroGLYCERIN (NITROSTAT) 0.4 MG SL tablet Place 1 tablet (0.4 mg total) under the tongue every 5 (five) minutes as needed for chest pain.  . pantoprazole (PROTONIX) 40 MG tablet Take 1 tablet (40 mg total) by mouth daily.  Vladimir Faster Glycol-Propyl Glycol (SYSTANE  OP) Place 1 drop into both eyes daily as needed (dry eyes).  Marland Kitchen spironolactone (ALDACTONE) 25 MG tablet Take 25 mg by mouth 2 (two) times daily.      Allergies:   Actonel [risedronate sodium]; Alendronate; Nizatidine; Penicillins; and Quinine   Social History   Socioeconomic History  . Marital status: Married    Spouse name: Not on file  . Number of children: Not on file  . Years of education: Not on file  . Highest education level: Not on file  Occupational History  . Not on file  Social Needs  . Financial resource strain: Not on file  . Food insecurity:    Worry: Not on file    Inability: Not on file  . Transportation needs:    Medical: Not on file    Non-medical: Not on file  Tobacco Use  . Smoking status: Never Smoker  . Smokeless tobacco: Never Used  Substance and Sexual Activity  . Alcohol use: Never    Frequency: Never  . Drug use: Never  . Sexual activity: Yes  Lifestyle  . Physical activity:    Days per week: Not on file    Minutes per session: Not on file  . Stress: Not on file  Relationships  . Social connections:    Talks on phone:  Not on file    Gets together: Not on file    Attends religious service: Not on file    Active member of club or organization: Not on file    Attends meetings of clubs or organizations: Not on file    Relationship status: Not on file  Other Topics Concern  . Not on file  Social History Narrative  . Not on file     Family History: The patient's family history includes Atrial fibrillation in her brother; Cancer in her father; Hypertension in her mother. ROS:   Please see the history of present illness.    All other systems reviewed and are negative.  EKGs/Labs/Other Studies Reviewed:    The following studies were reviewed today:  EKG:  EKG ordered today.  The ekg ordered today demonstrates sinus rhythm and is normal  Recent Labs: 02/11/2018: NT-Pro BNP 275 04/09/2018: BUN 8; Creatinine, Ser 0.52; Hemoglobin 12.5; Platelets 184; Potassium 3.8; Sodium 134  Recent Lipid Panel  01/20/18 Chol 201 HDL 75 LDL 128 No results found for: CHOL, TRIG, HDL, CHOLHDL, VLDL, LDLCALC, LDLDIRECT  Physical Exam:    VS:  BP 136/76 (BP Location: Left Arm, Patient Position: Sitting, Cuff Size: Normal)   Pulse 63   Ht 5\' 1"  (1.549 m)   Wt 145 lb (65.8 kg)   SpO2 96%   BMI 27.40 kg/m     Wt Readings from Last 3 Encounters:  05/19/18 145 lb (65.8 kg)  04/14/18 140 lb 12.8 oz (63.9 kg)  04/09/18 136 lb 11 oz (62 kg)     GEN:  Well nourished, well developed in no acute distress HEENT: Normal NECK: No JVD; No carotid bruits LYMPHATICS: No lymphadenopathy CARDIAC: RRR, no murmurs, rubs, gallops RESPIRATORY:  Clear to auscultation without rales, wheezing or rhonchi  ABDOMEN: Soft, non-tender, non-distended MUSCULOSKELETAL:  No edema; No deformity  SKIN: Warm and dry NEUROLOGIC:  Alert and oriented x 3 PSYCHIATRIC:  Normal affect    Signed, [name], MD  05/19/2018 10:01 AM    [place]

## 2018-05-19 ENCOUNTER — Encounter: Payer: Self-pay | Admitting: Cardiology

## 2018-05-19 ENCOUNTER — Ambulatory Visit (INDEPENDENT_AMBULATORY_CARE_PROVIDER_SITE_OTHER): Payer: PPO | Admitting: Cardiology

## 2018-05-19 VITALS — BP 136/76 | HR 63 | Ht 61.0 in | Wt 145.0 lb

## 2018-05-19 DIAGNOSIS — I25118 Atherosclerotic heart disease of native coronary artery with other forms of angina pectoris: Secondary | ICD-10-CM | POA: Diagnosis not present

## 2018-05-19 DIAGNOSIS — I10 Essential (primary) hypertension: Secondary | ICD-10-CM

## 2018-05-19 DIAGNOSIS — E785 Hyperlipidemia, unspecified: Secondary | ICD-10-CM

## 2018-05-19 DIAGNOSIS — K219 Gastro-esophageal reflux disease without esophagitis: Secondary | ICD-10-CM

## 2018-05-19 LAB — LIPID PANEL
CHOL/HDL RATIO: 1.9 ratio (ref 0.0–4.4)
Cholesterol, Total: 131 mg/dL (ref 100–199)
HDL: 69 mg/dL (ref >39)
LDL CALC: 44 mg/dL (ref 0–99)
TRIGLYCERIDES: 91 mg/dL (ref 0–149)
VLDL Cholesterol Cal: 18 mg/dL (ref 5–40)

## 2018-05-19 LAB — COMPREHENSIVE METABOLIC PANEL
A/G RATIO: 2.3 — AB (ref 1.2–2.2)
ALT: 21 IU/L (ref 0–32)
AST: 17 IU/L (ref 0–40)
Albumin: 4.6 g/dL (ref 3.5–4.7)
Alkaline Phosphatase: 83 IU/L (ref 39–117)
BILIRUBIN TOTAL: 0.5 mg/dL (ref 0.0–1.2)
BUN/Creatinine Ratio: 21 (ref 12–28)
BUN: 12 mg/dL (ref 8–27)
CO2: 23 mmol/L (ref 20–29)
Calcium: 10 mg/dL (ref 8.7–10.3)
Chloride: 99 mmol/L (ref 96–106)
Creatinine, Ser: 0.58 mg/dL (ref 0.57–1.00)
GFR calc non Af Amer: 86 mL/min/{1.73_m2} (ref >59)
GFR, EST AFRICAN AMERICAN: 99 mL/min/{1.73_m2} (ref >59)
Globulin, Total: 2 g/dL (ref 1.5–4.5)
Glucose: 90 mg/dL (ref 65–99)
POTASSIUM: 4.7 mmol/L (ref 3.5–5.2)
Sodium: 137 mmol/L (ref 134–144)
Total Protein: 6.6 g/dL (ref 6.0–8.5)

## 2018-05-19 MED ORDER — ALUM & MAG HYDROXIDE-SIMETH 400-400-40 MG/5ML PO SUSP: 5.0000 mL | Freq: Three times a day (TID) | ORAL | 0 refills | Status: DC

## 2018-05-19 NOTE — Patient Instructions (Signed)
Medication Instructions:  Your physician has recommended you make the following change in your medication:   STOP metoprolol succinate   START over the counter maalox: take 1 teaspoon after meals and at bedtime  If you need a refill on your cardiac medications before your next appointment, please call your pharmacy.   Lab work: Your physician recommends that you return for lab work today: CMP, lipid panel.   Testing/Procedures: You had an EKG today.   Follow-Up: At [facility], you and your health needs are our priority.  As part of our continuing mission to provide you with exceptional heart care, we have created designated Provider Care Teams.  These Care Teams include your primary Cardiologist (physician) and Advanced Practice Providers (APPs -  Physician Assistants and Nurse Practitioners) who all work together to provide you with the care you need, when you need it. You will need a follow up appointment in 6 months.  Please call our office 2 months in advance to schedule this appointment.    Aluminum Hydroxide; Magnesium Hydroxide; Simethicone oral suspension What is this medicine? ALUMINUM HYDROXIDE; MAGNESIUM HYDROXIDE; SIMETHICONE (a LOO mi num hye DROX ide; mag NEE zhum hye DROX ide; sye METH i kone) is an antacid and antigas medicine. It is used to relieve the symptoms of indigestion, heartburn, sour stomach, and the discomfort caused by gas. This medicine may be used for other purposes; ask your health care provider or pharmacist if you have questions. COMMON BRAND NAME(S): Alamag Plus, Aldroxicon-1, Almacone, Almacone Double Strength, GERI-LANTA, Maalox, Maalox Advanced, Maalox Max, Maalox Max Plus [place], Maalox Multi-Symptom, Mi-Acid, Mintox, Mylanta, Rulox, Rulox Plus, Uni-Lan, Uni-Lan II What should I tell my health care provider before I take this medicine? They need to know if you have any of these conditions: -bowel, intestinal, or stomach  disease -constipation -diarrhea -kidney disease -liver disease -on a sodium (salt) restricted diet -stomach bleeding or obstruction -an unusual or allergic reaction to aluminum hydroxide, magnesium hydroxide, simethicone, other medicines, foods, dyes, or preservatives -pregnant or trying to get pregnant -breast-feeding How should I use this medicine? Take this medicine by mouth. Follow the directions on the label. Shake well before using. Use a specially marked spoon or container to measure your medicine. Ask your pharmacist if you do not have one. Household spoons are not accurate. Antacids are usually taken after meals and at bedtime or as directed by your doctor or health care professional. After taking the medication, drink a full glass of water. Take your doses at regular intervals. Do not take your medicine more often than directed. Talk to your pediatrician regarding the use of this medicine in children. While this drug may be prescribed for children as young as 47 years old for selected conditions, precautions do apply. Overdosage: If you think you have taken too much of this medicine contact a poison control center or emergency room at once. NOTE: This medicine is only for you. Do not share this medicine with others. What if I miss a dose? If you miss a dose, take it as soon as you can. If it is almost time for your next dose, take only that dose. Do not take double or extra doses. What may interact with this medicine? -amphetamine -antibiotics -captopril -delavirdine -gabapentin -heart medicines, such as digoxin or digitoxin -hyoscyamine -iron salts -isoniazid -medicines for breathing difficulties like ipratropium and tiotropium -medicines for diabetes -medicines for fungal infections like itraconazole and ketoconazole -medicines for osteoporosis like alendronate, etidronate, risedronate and tiludronate -medicines  for overactive bladder like oxybutynin and  tolterodine -medicines for seizures like ethotoin and phenytoin -methenamine -mycophenolate -pancrelipase -penicillamine -phenothiazines like chlorpromazine, mesoridazine, prochlorperazine, thioridazine -quinidine -rosuvastatin -sodium fluoride -sodium polystyrene sulfonate -sotalol -sucralfate -tacrolimus -thyroid hormones like levothyroxine -ursodiol -vitamin D -zalcitabine This list may not describe all possible interactions. Give your health care provider a list of all the medicines, herbs, non-prescription drugs, or dietary supplements you use. Also tell them if you smoke, drink alcohol, or use illegal drugs. Some items may interact with your medicine. What should I watch for while using this medicine? Tell your doctor or healthcare professional if your symptoms do not start to get better or if they get worse. Do not treat yourself for stomach problems with this medicine for more than 2 weeks. See a doctor if you have black tarry stools, rectal bleeding, or if you feel unusually tired. Do not change to another antacid product without advice. If you are taking other medicines, leave an interval of at least 2 hours before or after taking this medicine. To help reduce constipation, drink several glasses of water a day. What side effects may I notice from receiving this medicine? Side effects that you should report to your doctor or health care professional as soon as possible: -allergic reactions like skin rash, itching or hives, swelling of the face, lips, or tongue -bone or joint aches and pains -confusion or irritability -headache -loss of appetite -nausea, vomiting -unusually weak or tired Side effects that usually do not require medical attention (report to your doctor or health care professional if they continue or are bothersome): -chalky taste -constipation -diarrhea -hemorrhoids This list may not describe all possible side effects. Call your doctor for medical advice  about side effects. You may report side effects to FDA at 1-800-FDA-1088. Where should I keep my medicine? Keep out of the reach of children. Store at room temperature between 15 and 30 degrees C (59 and 86 degrees F). Do not freeze. Protect from light and moisture. Throw away any unused medicine after the expiration date. NOTE: This sheet is a summary. It may not cover all possible information. If you have questions about this medicine, talk to your doctor, pharmacist, or health care provider.  2018 Elsevier/Gold Standard (2007-10-08 11:14:19)

## 2018-05-29 DIAGNOSIS — Z955 Presence of coronary angioplasty implant and graft: Secondary | ICD-10-CM | POA: Diagnosis not present

## 2018-06-10 DIAGNOSIS — H25813 Combined forms of age-related cataract, bilateral: Secondary | ICD-10-CM | POA: Diagnosis not present

## 2018-06-17 DIAGNOSIS — K582 Mixed irritable bowel syndrome: Secondary | ICD-10-CM | POA: Diagnosis not present

## 2018-06-17 DIAGNOSIS — R609 Edema, unspecified: Secondary | ICD-10-CM | POA: Diagnosis not present

## 2018-06-17 DIAGNOSIS — I2510 Atherosclerotic heart disease of native coronary artery without angina pectoris: Secondary | ICD-10-CM | POA: Diagnosis not present

## 2018-06-17 DIAGNOSIS — E782 Mixed hyperlipidemia: Secondary | ICD-10-CM | POA: Diagnosis not present

## 2018-06-17 DIAGNOSIS — K219 Gastro-esophageal reflux disease without esophagitis: Secondary | ICD-10-CM | POA: Diagnosis not present

## 2018-06-17 DIAGNOSIS — M150 Primary generalized (osteo)arthritis: Secondary | ICD-10-CM | POA: Diagnosis not present

## 2018-06-17 DIAGNOSIS — I10 Essential (primary) hypertension: Secondary | ICD-10-CM | POA: Diagnosis not present

## 2018-06-17 DIAGNOSIS — R9389 Abnormal findings on diagnostic imaging of other specified body structures: Secondary | ICD-10-CM | POA: Diagnosis not present

## 2018-06-17 DIAGNOSIS — Z79899 Other long term (current) drug therapy: Secondary | ICD-10-CM | POA: Diagnosis not present

## 2018-06-29 DIAGNOSIS — Z955 Presence of coronary angioplasty implant and graft: Secondary | ICD-10-CM | POA: Diagnosis not present

## 2018-07-16 ENCOUNTER — Ambulatory Visit: Payer: PPO | Admitting: Cardiology

## 2018-08-03 DIAGNOSIS — R5381 Other malaise: Secondary | ICD-10-CM | POA: Diagnosis not present

## 2018-08-03 DIAGNOSIS — K582 Mixed irritable bowel syndrome: Secondary | ICD-10-CM | POA: Diagnosis not present

## 2018-08-03 DIAGNOSIS — R609 Edema, unspecified: Secondary | ICD-10-CM | POA: Diagnosis not present

## 2018-08-03 DIAGNOSIS — K219 Gastro-esophageal reflux disease without esophagitis: Secondary | ICD-10-CM | POA: Diagnosis not present

## 2018-08-03 DIAGNOSIS — E782 Mixed hyperlipidemia: Secondary | ICD-10-CM | POA: Diagnosis not present

## 2018-08-03 DIAGNOSIS — K5730 Diverticulosis of large intestine without perforation or abscess without bleeding: Secondary | ICD-10-CM | POA: Diagnosis not present

## 2018-08-03 DIAGNOSIS — I2510 Atherosclerotic heart disease of native coronary artery without angina pectoris: Secondary | ICD-10-CM | POA: Diagnosis not present

## 2018-08-03 DIAGNOSIS — I10 Essential (primary) hypertension: Secondary | ICD-10-CM | POA: Diagnosis not present

## 2018-08-03 DIAGNOSIS — R5383 Other fatigue: Secondary | ICD-10-CM | POA: Diagnosis not present

## 2018-08-03 DIAGNOSIS — M150 Primary generalized (osteo)arthritis: Secondary | ICD-10-CM | POA: Diagnosis not present

## 2018-08-03 DIAGNOSIS — Z79899 Other long term (current) drug therapy: Secondary | ICD-10-CM | POA: Diagnosis not present

## 2018-08-04 DIAGNOSIS — Z01818 Encounter for other preprocedural examination: Secondary | ICD-10-CM | POA: Diagnosis not present

## 2018-08-04 DIAGNOSIS — H2513 Age-related nuclear cataract, bilateral: Secondary | ICD-10-CM | POA: Diagnosis not present

## 2018-08-04 DIAGNOSIS — H2511 Age-related nuclear cataract, right eye: Secondary | ICD-10-CM | POA: Diagnosis not present

## 2018-08-04 DIAGNOSIS — H40013 Open angle with borderline findings, low risk, bilateral: Secondary | ICD-10-CM | POA: Diagnosis not present

## 2018-08-25 DIAGNOSIS — H40013 Open angle with borderline findings, low risk, bilateral: Secondary | ICD-10-CM | POA: Diagnosis not present

## 2018-08-25 DIAGNOSIS — H259 Unspecified age-related cataract: Secondary | ICD-10-CM | POA: Diagnosis not present

## 2018-08-25 DIAGNOSIS — Z7982 Long term (current) use of aspirin: Secondary | ICD-10-CM | POA: Diagnosis not present

## 2018-08-25 DIAGNOSIS — H2511 Age-related nuclear cataract, right eye: Secondary | ICD-10-CM | POA: Diagnosis not present

## 2018-08-25 DIAGNOSIS — Z955 Presence of coronary angioplasty implant and graft: Secondary | ICD-10-CM | POA: Diagnosis not present

## 2018-08-25 DIAGNOSIS — I2510 Atherosclerotic heart disease of native coronary artery without angina pectoris: Secondary | ICD-10-CM | POA: Diagnosis not present

## 2018-08-25 DIAGNOSIS — I10 Essential (primary) hypertension: Secondary | ICD-10-CM | POA: Diagnosis not present

## 2018-08-25 DIAGNOSIS — Z79899 Other long term (current) drug therapy: Secondary | ICD-10-CM | POA: Diagnosis not present

## 2018-08-25 DIAGNOSIS — K219 Gastro-esophageal reflux disease without esophagitis: Secondary | ICD-10-CM | POA: Diagnosis not present

## 2018-09-08 DIAGNOSIS — Z7982 Long term (current) use of aspirin: Secondary | ICD-10-CM | POA: Diagnosis not present

## 2018-09-08 DIAGNOSIS — Z85828 Personal history of other malignant neoplasm of skin: Secondary | ICD-10-CM | POA: Diagnosis not present

## 2018-09-08 DIAGNOSIS — H259 Unspecified age-related cataract: Secondary | ICD-10-CM | POA: Diagnosis not present

## 2018-09-08 DIAGNOSIS — I10 Essential (primary) hypertension: Secondary | ICD-10-CM | POA: Diagnosis not present

## 2018-09-08 DIAGNOSIS — K219 Gastro-esophageal reflux disease without esophagitis: Secondary | ICD-10-CM | POA: Diagnosis not present

## 2018-09-08 DIAGNOSIS — I2510 Atherosclerotic heart disease of native coronary artery without angina pectoris: Secondary | ICD-10-CM | POA: Diagnosis not present

## 2018-09-08 DIAGNOSIS — Z955 Presence of coronary angioplasty implant and graft: Secondary | ICD-10-CM | POA: Diagnosis not present

## 2018-09-08 DIAGNOSIS — E785 Hyperlipidemia, unspecified: Secondary | ICD-10-CM | POA: Diagnosis not present

## 2018-09-08 DIAGNOSIS — Z7902 Long term (current) use of antithrombotics/antiplatelets: Secondary | ICD-10-CM | POA: Diagnosis not present

## 2018-09-08 DIAGNOSIS — Z79899 Other long term (current) drug therapy: Secondary | ICD-10-CM | POA: Diagnosis not present

## 2018-09-08 DIAGNOSIS — K589 Irritable bowel syndrome without diarrhea: Secondary | ICD-10-CM | POA: Diagnosis not present

## 2018-09-08 DIAGNOSIS — H2512 Age-related nuclear cataract, left eye: Secondary | ICD-10-CM | POA: Diagnosis not present

## 2018-09-25 DIAGNOSIS — M25552 Pain in left hip: Secondary | ICD-10-CM | POA: Diagnosis not present

## 2018-09-25 DIAGNOSIS — W03XXXA Other fall on same level due to collision with another person, initial encounter: Secondary | ICD-10-CM | POA: Diagnosis not present

## 2018-09-29 DIAGNOSIS — S79912A Unspecified injury of left hip, initial encounter: Secondary | ICD-10-CM | POA: Diagnosis not present

## 2018-09-29 DIAGNOSIS — M25552 Pain in left hip: Secondary | ICD-10-CM | POA: Diagnosis not present

## 2018-11-02 ENCOUNTER — Telehealth (INDEPENDENT_AMBULATORY_CARE_PROVIDER_SITE_OTHER): Payer: PPO | Admitting: Cardiology

## 2018-11-02 ENCOUNTER — Encounter: Payer: Self-pay | Admitting: Cardiology

## 2018-11-02 VITALS — BP 135/82 | HR 78 | Ht 61.0 in | Wt 138.5 lb

## 2018-11-02 DIAGNOSIS — I25118 Atherosclerotic heart disease of native coronary artery with other forms of angina pectoris: Secondary | ICD-10-CM | POA: Diagnosis not present

## 2018-11-02 DIAGNOSIS — Z7189 Other specified counseling: Secondary | ICD-10-CM

## 2018-11-02 DIAGNOSIS — K219 Gastro-esophageal reflux disease without esophagitis: Secondary | ICD-10-CM | POA: Diagnosis not present

## 2018-11-02 DIAGNOSIS — I10 Essential (primary) hypertension: Secondary | ICD-10-CM | POA: Diagnosis not present

## 2018-11-02 DIAGNOSIS — E785 Hyperlipidemia, unspecified: Secondary | ICD-10-CM | POA: Diagnosis not present

## 2018-11-02 MED ORDER — ATORVASTATIN CALCIUM 40 MG PO TABS: 40.0000 mg | ORAL_TABLET | Freq: Every day | ORAL | 3 refills | Status: DC

## 2018-11-02 NOTE — Patient Instructions (Signed)
Medication Instructions:  Your physician recommends that you continue on your current medications as directed. Please refer to the Current Medication list given to you today.  If you need a refill on your cardiac medications before your next appointment, please call your pharmacy.   Lab work: None  If you have labs (blood work) drawn today and your tests are completely normal, you will receive your results only by: Marland Kitchen MyChart Message (if you have MyChart) OR . A paper copy in the mail If you have any lab test that is abnormal or we need to change your treatment, we will call you to review the results.  Testing/Procedures: None  Follow-Up: At [facility], you and your health needs are our priority.  As part of our continuing mission to provide you with exceptional heart care, we have created designated Provider Care Teams.  These Care Teams include your primary Cardiologist (physician) and Advanced Practice Providers (APPs -  Physician Assistants and Nurse Practitioners) who all work together to provide you with the care you need, when you need it. You will need a follow up appointment in 5 months: Friday, 04/02/2019, at 11:00 am in the [place] office.

## 2018-11-02 NOTE — Progress Notes (Signed)
Virtual Visit via Telephone Note   This visit type was conducted due to national recommendations for restrictions regarding the COVID-19 Pandemic (e.g. social distancing) in an effort to limit this patient's exposure and mitigate transmission in our community.  Due to her co-morbid illnesses, this patient is at least at moderate risk for complications without adequate follow up.  This format is felt to be most appropriate for this patient at this time.  The patient did not have access to video technology/had technical difficulties with video requiring transitioning to audio format only (telephone).  All issues noted in this document were discussed and addressed.  No physical exam could be performed with this format.  Please refer to the patient's chart for her  consent to telehealth for [facility].   Evaluation Performed:  Follow-up visit  Date:  11/02/2018   ID:  Pamela Dominguez, Pamela Dominguez 02-Jul-1933, MRN [id number]  Patient Location: Home  Provider Location: Home  PCP:  [name]., MD  Cardiologist:  [name], MD Dr Bettina Gavia Electrophysiologist:  None   Chief Complaint:  FU for CAD  History of Present Illness:    Pamela Dominguez is a 85 y.o. female who presents via audio/video conferencing for a telehealth visit today.    Pamela Dominguez is a 85 y.o. female with a hx of hypertension chest pain exertional shortness of breath last  seen 05/20/19.  At that time she was found to have class III typical angina pectoris was referred for cardiac CTA which shows flow-limiting stenosis in the proximal left anterior descending coronary artery. Coronary angiography 04/08/18 showed severe LAD stenosis with PCI and DES performed.   The patient does not have symptoms concerning for COVID-19 infection (fever, chills, cough, or new shortness of breath).   Despite stay at home and social distance she is walking 20 to 30 minutes a day and has had no angina dyspnea edema palpitation or syncope and tolerates  dual antiplatelet therapy with her esophageal reflux taking PPI and H2.  She has had no fever chills cough shortness of breath and no exposure to COVID-19. Past Medical History:  Diagnosis Date  . Abnormal CXR 01/21/2018  . Arthritis    "all over" (04/08/2018)  . CAD S/P percutaneous coronary angioplasty and stent DES mLAD beyond 2nd diag 04/08/2018  . Chest pain 01/22/2018  . Chronic edema 05/19/2017  . Chronic lower back pain   . Complication of anesthesia 1989   mouth joint trouble after hysterctomy, told by dentist to haver anesthesia use bite block to keep mouth open same amount all over  . Coronary artery disease   . Diverticulosis   . Diverticulosis of colon 09/28/2013  . Diverticulosis of colon (without mention of hemorrhage) 09/28/2013  . DJD (degenerative joint disease)   . Essential hypertension 09/18/2015   Last Assessment & Plan:  Relevant Hx: Course: Daily Update: Today's Plan:this is stable for her at this time and will follow and on repeat she was much better overall Electronically signed by: Mayer Camel, NP 09/18/15 1146  . Family history of anesthesia complication    brother low bp, heart problems, ileus  . GERD (gastroesophageal reflux disease)   . High risk medication use 09/18/2015  . History of diverticulosis 08/28/2017  . Hyperlipidemia   . Hyperlipidemia, unspecified 09/18/2015   Last Assessment & Plan:  Update her lipids for her fasting  . Hypertension   . IBS (irritable bowel syndrome)   . Irritable bowel syndrome 09/28/2013  .  Irritable bowel syndrome with both constipation and diarrhea 09/18/2015   Last Assessment & Plan:  She has more fecal incontinence and she has to wear a pad and that is part of her UTI issues, though the Lucrezia Starch has helped her about 75% to diminish this and for that she is pleased.  . OA (osteoarthritis) of knee 09/27/2013  . Osteopenia   . Other and unspecified hyperlipidemia 09/28/2013  . Pneumonia 1955  . Squamous carcinoma ~ 2009    "right ear"  . TMJ (dislocation of temporomandibular joint)   . Unspecified essential hypertension 09/28/2013   Past Surgical History:  Procedure Laterality Date  . ABDOMINAL HYSTERECTOMY  1989  . APPENDECTOMY  1948  . CHOLECYSTECTOMY  1999  . COLONOSCOPY  2013  . CORONARY ANGIOPLASTY WITH STENT PLACEMENT  04/08/2018  . CORONARY STENT INTERVENTION N/A 04/08/2018   Procedure: CORONARY STENT INTERVENTION;  Surgeon: Leonie Man, MD;  Location: [facility];  Service: Cardiovascular;  Laterality: N/A;  . JOINT REPLACEMENT    . KNEE ARTHROSCOPY Left 1996  . LEFT HEART CATH AND CORONARY ANGIOGRAPHY N/A 04/08/2018   Procedure: LEFT HEART CATH AND CORONARY ANGIOGRAPHY;  Surgeon: Leonie Man, MD;  Location: [facility];  Service: Cardiovascular;  Laterality: N/A;  . NASAL SINUS SURGERY  1968  . SQUAMOUS CELL CARCINOMA EXCISION Right    "ear"  . TONSILLECTOMY AND ADENOIDECTOMY  1942  . TOTAL KNEE ARTHROPLASTY Right 09/27/2013   Procedure: RIGHT TOTAL KNEE ARTHROPLASTY;  Surgeon: Gearlean Alf, MD;  Location: [facility];  Service: Orthopedics;  Laterality: Right;     Current Meds  Medication Sig  . acetaminophen (TYLENOL) 650 MG CR tablet Take 650 mg by mouth daily as needed for pain.  Marland Kitchen amLODipine-valsartan (EXFORGE) 10-320 MG tablet Take 1 tablet by mouth daily.  Marland Kitchen aspirin EC 81 MG tablet Take 81 mg by mouth daily.  Marland Kitchen atorvastatin (LIPITOR) 40 MG tablet Take 1 tablet (40 mg total) by mouth daily at 6 PM.  . b complex vitamins tablet Take 1 tablet by mouth daily.  . Biotin 10000 MCG TABS Take 10,000 mcg by mouth daily.  . cetirizine (ZYRTEC) 10 MG tablet Take 10 mg by mouth daily.  . cholestyramine (QUESTRAN) 4 G packet Take 4 g by mouth at bedtime.  . clopidogrel (PLAVIX) 75 MG tablet Take 1 tablet (75 mg total) by mouth daily with breakfast.  . famotidine (PEPCID) 20 MG tablet Take 1 tablet by mouth 2 (two) times daily.  Marland Kitchen glucosamine-chondroitin 500-400 MG tablet Take 1  tablet by mouth 2 (two) times daily.  Marland Kitchen loperamide (IMODIUM A-D) 2 MG tablet Take 4 mg by mouth as needed for diarrhea or loose stools.  . Melatonin 5 MG CAPS Take 5 mg by mouth at bedtime as needed (sleep).  . Multiple Vitamin (MULTIVITAMIN) tablet Take 1 tablet by mouth daily.  . Multiple Vitamins-Minerals (OCUVITE PO) Take 1 tablet by mouth daily.  . nitroGLYCERIN (NITROSTAT) 0.4 MG SL tablet Place 1 tablet (0.4 mg total) under the tongue every 5 (five) minutes as needed for chest pain.  . pantoprazole (PROTONIX) 40 MG tablet Take 1 tablet (40 mg total) by mouth daily.  Vladimir Faster Glycol-Propyl Glycol (SYSTANE OP) Place 1 drop into both eyes daily as needed (dry eyes).  Marland Kitchen spironolactone (ALDACTONE) 25 MG tablet Take 25 mg by mouth 2 (two) times daily.      Allergies:   Actonel [risedronate sodium]; Alendronate; Nizatidine; Penicillins; and Quinine  Social History   Tobacco Use  . Smoking status: Never Smoker  . Smokeless tobacco: Never Used  Substance Use Topics  . Alcohol use: Never    Frequency: Never  . Drug use: Never     Family Hx: The patient's family history includes Atrial fibrillation in her brother; Cancer in her father; Hypertension in her mother.  ROS:   Please see the history of present illness.     All other systems reviewed and are negative.   Prior CV studies:   The following studies were reviewed today:    Labs/Other Tests and Data Reviewed:      Recent Labs: 02/11/2018: NT-Pro BNP 275 04/09/2018: Hemoglobin 12.5; Platelets 184 05/19/2018: ALT 21; BUN 12; Creatinine, Ser 0.58; Potassium 4.7; Sodium 137   Recent Lipid Panel Lab Results  Component Value Date/Time   CHOL 131 05/19/2018 10:21 AM   TRIG 91 05/19/2018 10:21 AM   HDL 69 05/19/2018 10:21 AM   CHOLHDL 1.9 05/19/2018 10:21 AM   LDLCALC 44 05/19/2018 10:21 AM    Wt Readings from Last 3 Encounters:  11/02/18 138 lb 8 oz (62.8 kg)  05/19/18 145 lb (65.8 kg)  04/14/18 140 lb 12.8 oz  (63.9 kg)     Objective:    Vital Signs:  BP 135/82 (BP Location: Left Arm, Patient Position: Sitting)   Pulse 78   Ht 5\' 1"  (1.549 m)   Wt 138 lb 8 oz (62.8 kg)   BMI 26.17 kg/m    Well nourished, well developed female in no acute distress.   ASSESSMENT & PLAN:    1. CAD, stable [place] Heart Association class I asymptomatic since PCI and stent continue dual antiplatelet therapy and a high intensity statin.  Also in the office in September will make a decision whether withdraw clopidogrel especially with the reflux or whether to continue long-term dual antiplatelet.  I do not think she requires an ischemia evaluation at this time. 2. Hypertension stable continue current treatment occluding combination calcium channel blocker ARB next office visit check renal function potassium 3. Hyperlipidemia stable continue high intensity statin well-tolerated no muscle symptoms check liver function for safety lipid profile for efficacy goal LDL less than 70 next visit 4. Esophageal reflux, GERD stable asymptomatic continue her current PPI and H2 blocker she had trouble tolerating aspirin clopidogrel initially make a decision in September if we continue long-term therapy  COVID-19 Education: The signs and symptoms of COVID-19 were discussed with the patient and how to seek care for testing (follow up with PCP or arrange E-visit).  The importance of social distancing was discussed today.  Terms of coronary disease she is done well she has a healthy lifestyle and will need to make a decision whether to continue dual antiplatelet therapy 1 year from her PCI.  Hypertension hyperlipidemia esophageal reflux are treated asymptomatic continue current medical therapy  Time:   Today, I have spent 25 minutes with the patient with telehealth technology discussing the above problems.     Medication Adjustments/Labs and Tests Ordered: Current medicines are reviewed at length with the patient today.  Concerns  regarding medicines are outlined above.  Tests Ordered: No orders of the defined types were placed in this encounter.  Medication Changes: No orders of the defined types were placed in this encounter.   Disposition:  Follow up Septrmber 2020  Signed, [name], MD  11/02/2018 4:09 PM    [facility]

## 2018-11-13 DIAGNOSIS — N390 Urinary tract infection, site not specified: Secondary | ICD-10-CM | POA: Diagnosis not present

## 2018-11-13 DIAGNOSIS — R300 Dysuria: Secondary | ICD-10-CM | POA: Diagnosis not present

## 2018-11-16 ENCOUNTER — Ambulatory Visit: Payer: PPO | Admitting: Cardiology

## 2018-11-17 DIAGNOSIS — L821 Other seborrheic keratosis: Secondary | ICD-10-CM | POA: Diagnosis not present

## 2018-11-17 DIAGNOSIS — L578 Other skin changes due to chronic exposure to nonionizing radiation: Secondary | ICD-10-CM | POA: Diagnosis not present

## 2018-11-17 DIAGNOSIS — D1801 Hemangioma of skin and subcutaneous tissue: Secondary | ICD-10-CM | POA: Diagnosis not present

## 2018-12-02 DIAGNOSIS — S63501A Unspecified sprain of right wrist, initial encounter: Secondary | ICD-10-CM | POA: Diagnosis not present

## 2018-12-02 DIAGNOSIS — M25531 Pain in right wrist: Secondary | ICD-10-CM | POA: Diagnosis not present

## 2019-02-05 DIAGNOSIS — R001 Bradycardia, unspecified: Secondary | ICD-10-CM | POA: Diagnosis not present

## 2019-02-05 DIAGNOSIS — K5730 Diverticulosis of large intestine without perforation or abscess without bleeding: Secondary | ICD-10-CM | POA: Diagnosis not present

## 2019-02-05 DIAGNOSIS — K219 Gastro-esophageal reflux disease without esophagitis: Secondary | ICD-10-CM | POA: Diagnosis not present

## 2019-02-05 DIAGNOSIS — I10 Essential (primary) hypertension: Secondary | ICD-10-CM | POA: Diagnosis not present

## 2019-02-05 DIAGNOSIS — I2510 Atherosclerotic heart disease of native coronary artery without angina pectoris: Secondary | ICD-10-CM | POA: Diagnosis not present

## 2019-02-05 DIAGNOSIS — E782 Mixed hyperlipidemia: Secondary | ICD-10-CM | POA: Diagnosis not present

## 2019-02-05 DIAGNOSIS — K582 Mixed irritable bowel syndrome: Secondary | ICD-10-CM | POA: Diagnosis not present

## 2019-02-05 DIAGNOSIS — F439 Reaction to severe stress, unspecified: Secondary | ICD-10-CM

## 2019-02-05 DIAGNOSIS — M8949 Other hypertrophic osteoarthropathy, multiple sites: Secondary | ICD-10-CM | POA: Diagnosis not present

## 2019-02-05 DIAGNOSIS — R0602 Shortness of breath: Secondary | ICD-10-CM | POA: Diagnosis not present

## 2019-02-05 DIAGNOSIS — Z79899 Other long term (current) drug therapy: Secondary | ICD-10-CM | POA: Diagnosis not present

## 2019-02-05 DIAGNOSIS — R609 Edema, unspecified: Secondary | ICD-10-CM | POA: Diagnosis not present

## 2019-02-05 HISTORY — DX: Reaction to severe stress, unspecified: F43.9

## 2019-02-17 DIAGNOSIS — H02403 Unspecified ptosis of bilateral eyelids: Secondary | ICD-10-CM | POA: Diagnosis not present

## 2019-02-17 DIAGNOSIS — Z01818 Encounter for other preprocedural examination: Secondary | ICD-10-CM | POA: Diagnosis not present

## 2019-02-21 NOTE — Progress Notes (Signed)
Cardiology Office Note:    Date:  02/22/2019   ID:  Pamela Dominguez, DOB May 15, 1933, MRN [id number]  PCP:  [name]., MD  Cardiologist:  [name], MD    Referring MD: [name]., MD    ASSESSMENT:    1. SOB (shortness of breath)   2. Coronary artery disease of native artery of native heart with stable angina pectoris ([place])   3. Essential hypertension   4. Hyperlipidemia LDL goal <70    PLAN:    In order of problems listed above:  1. Prior to PCI and stent she was having exertional shortness of breath and some chest tightness.  Symptoms are similar but not as severe.  Reviewed the options for evaluation including direct referral to coronary angiography and she does not feel that this is severe enough.  She undergo pharmacologic myocardial perfusion study in my office if abnormal will need coronary angiography.  She hands me a form to have elective eye surgery performed withdrawing aspirin and clopidogrel and I told her we need to delay that decision pending results 2. See above concerns for recurrent ischemia continue current medical treatment including dual antiplatelet therapy and proceed to myocardial perfusion test 3. Stable BP at target continue treatment including ARB calcium channel blocker 4. Stable her lipids are ideal LDL under 50 on high intensity statin   Next appointment: 3 months   Medication Adjustments/Labs and Tests Ordered: Current medicines are reviewed at length with the patient today.  Concerns regarding medicines are outlined above.  No orders of the defined types were placed in this encounter.  No orders of the defined types were placed in this encounter.   Chief Complaint  Patient presents with  . Shortness of Breath    sent by her PCP  . Follow-up    for   . Coronary Artery Disease    History of Present Illness:    Pamela Dominguez is a 85 y.o. female with a hx of hypertension chest pain exertional shortness of breath she was found to  have class III typical angina pectoris was referred for cardiac CTA which shows flow-limiting stenosis in the proximal left anterior descending coronary artery. Coronary angiography 04/08/18 showed severe LAD stenosis with PCI and DES performed.  She recently saw her PCP with increasing stress in her life dealing with her husband's dementia and increasing shortness of breath. She was last seen 11/02/2018. Compliance with diet, lifestyle and medications: yes  She is under increased stress at home with her husband's dementia no longer exercises has knee pain and just is not having the same quality of life she has had before for several months she has been breathless when she does household activities walks with dog outdoors but no edema orthopnea or chest pain.  The symptoms are not as severe as they were prior to PCI but she has never recovered back to what she was after cardiac rehabilitation.  No fever cough or bronchospasm.  None of her medications cause pulmonary symptoms. Past Medical History:  Diagnosis Date  . Abnormal CXR 01/21/2018  . Arthritis    "all over" (04/08/2018)  . CAD S/P percutaneous coronary angioplasty and stent DES mLAD beyond 2nd diag 04/08/2018  . Chest pain 01/22/2018  . Chronic edema 05/19/2017  . Chronic lower back pain   . Complication of anesthesia 1989   mouth joint trouble after hysterctomy, told by dentist to haver anesthesia use bite block to keep mouth open same amount all  over  . Coronary artery disease   . Diverticulosis   . Diverticulosis of colon 09/28/2013  . Diverticulosis of colon (without mention of hemorrhage) 09/28/2013  . DJD (degenerative joint disease)   . Essential hypertension 09/18/2015   Last Assessment & Plan:  Relevant Hx: Course: Daily Update: Today's Plan:this is stable for her at this time and will follow and on repeat she was much better overall Electronically signed by: Mayer Camel, NP 09/18/15 1146  . Family history of anesthesia  complication    brother low bp, heart problems, ileus  . GERD (gastroesophageal reflux disease)   . High risk medication use 09/18/2015  . History of diverticulosis 08/28/2017  . Hyperlipidemia   . Hyperlipidemia, unspecified 09/18/2015   Last Assessment & Plan:  Update her lipids for her fasting  . Hypertension   . IBS (irritable bowel syndrome)   . Irritable bowel syndrome 09/28/2013  . Irritable bowel syndrome with both constipation and diarrhea 09/18/2015   Last Assessment & Plan:  She has more fecal incontinence and she has to wear a pad and that is part of her UTI issues, though the Lucrezia Starch has helped her about 75% to diminish this and for that she is pleased.  . OA (osteoarthritis) of knee 09/27/2013  . Osteopenia   . Other and unspecified hyperlipidemia 09/28/2013  . Pneumonia 1955  . Squamous carcinoma ~ 2009   "right ear"  . TMJ (dislocation of temporomandibular joint)   . Unspecified essential hypertension 09/28/2013    Past Surgical History:  Procedure Laterality Date  . ABDOMINAL HYSTERECTOMY  1989  . APPENDECTOMY  1948  . CHOLECYSTECTOMY  1999  . COLONOSCOPY  2013  . CORONARY ANGIOPLASTY WITH STENT PLACEMENT  04/08/2018  . CORONARY STENT INTERVENTION N/A 04/08/2018   Procedure: CORONARY STENT INTERVENTION;  Surgeon: Leonie Man, MD;  Location: [facility];  Service: Cardiovascular;  Laterality: N/A;  . JOINT REPLACEMENT    . KNEE ARTHROSCOPY Left 1996  . LEFT HEART CATH AND CORONARY ANGIOGRAPHY N/A 04/08/2018   Procedure: LEFT HEART CATH AND CORONARY ANGIOGRAPHY;  Surgeon: Leonie Man, MD;  Location: [facility];  Service: Cardiovascular;  Laterality: N/A;  . NASAL SINUS SURGERY  1968  . SQUAMOUS CELL CARCINOMA EXCISION Right    "ear"  . TONSILLECTOMY AND ADENOIDECTOMY  1942  . TOTAL KNEE ARTHROPLASTY Right 09/27/2013   Procedure: RIGHT TOTAL KNEE ARTHROPLASTY;  Surgeon: Gearlean Alf, MD;  Location: [facility];  Service: Orthopedics;  Laterality: Right;     Current Medications: Current Meds  Medication Sig  . acetaminophen (TYLENOL) 650 MG CR tablet Take 650 mg by mouth daily as needed for pain.  Marland Kitchen amLODipine-valsartan (EXFORGE) 10-320 MG tablet Take 1 tablet by mouth daily.  Marland Kitchen aspirin EC 81 MG tablet Take 81 mg by mouth daily.  Marland Kitchen atorvastatin (LIPITOR) 40 MG tablet Take 1 tablet (40 mg total) by mouth daily at 6 PM.  . b complex vitamins tablet Take 1 tablet by mouth daily.  . Biotin 10000 MCG TABS Take 10,000 mcg by mouth daily.  . cetirizine (ZYRTEC) 10 MG tablet Take 10 mg by mouth daily.  . cholestyramine (QUESTRAN) 4 G packet Take 4 g by mouth at bedtime.  . clopidogrel (PLAVIX) 75 MG tablet Take 1 tablet (75 mg total) by mouth daily with breakfast.  . famotidine (PEPCID) 20 MG tablet Take 1 tablet by mouth 2 (two) times daily.  Marland Kitchen glucosamine-chondroitin 500-400 MG tablet Take 1 tablet  by mouth 2 (two) times daily.  Marland Kitchen loperamide (IMODIUM A-D) 2 MG tablet Take 4 mg by mouth as needed for diarrhea or loose stools.  . Melatonin 5 MG CAPS Take 5 mg by mouth at bedtime as needed (sleep).  . Multiple Vitamin (MULTIVITAMIN) tablet Take 1 tablet by mouth daily.  . Multiple Vitamins-Minerals (OCUVITE PO) Take 1 tablet by mouth daily.  . nitroGLYCERIN (NITROSTAT) 0.4 MG SL tablet Place 1 tablet (0.4 mg total) under the tongue every 5 (five) minutes as needed for chest pain.  . pantoprazole (PROTONIX) 40 MG tablet Take 1 tablet (40 mg total) by mouth daily.  Vladimir Faster Glycol-Propyl Glycol (SYSTANE OP) Place 1 drop into both eyes daily as needed (dry eyes).  Marland Kitchen spironolactone (ALDACTONE) 25 MG tablet Take 25 mg by mouth 2 (two) times daily.      Allergies:   Actonel [risedronate sodium], Alendronate, Nizatidine, Penicillins, and Quinine   Social History   Socioeconomic History  . Marital status: Married    Spouse name: Not on file  . Number of children: Not on file  . Years of education: Not on file  . Highest education level: Not on  file  Occupational History  . Not on file  Social Needs  . Financial resource strain: Not on file  . Food insecurity    Worry: Not on file    Inability: Not on file  . Transportation needs    Medical: Not on file    Non-medical: Not on file  Tobacco Use  . Smoking status: Never Smoker  . Smokeless tobacco: Never Used  Substance and Sexual Activity  . Alcohol use: Never    Frequency: Never  . Drug use: Never  . Sexual activity: Yes  Lifestyle  . Physical activity    Days per week: Not on file    Minutes per session: Not on file  . Stress: Not on file  Relationships  . Social Herbalist on phone: Not on file    Gets together: Not on file    Attends religious service: Not on file    Active member of club or organization: Not on file    Attends meetings of clubs or organizations: Not on file    Relationship status: Not on file  Other Topics Concern  . Not on file  Social History Narrative  . Not on file     Family History: The patient's family history includes Atrial fibrillation in her brother; Cancer in her father; Hypertension in her mother. ROS:   Please see the history of present illness.    All other systems reviewed and are negative.  EKGs/Labs/Other Studies Reviewed:    The following studies were reviewed today:  EKG:  EKG ordered today and personally reviewed.  The ekg ordered today demonstrates sinus rhythm and is normal no evidence of ischemia  Recent Labs:  02/05/2019: CMP with a sodium 130 potassium 4.1 creatinine 0.6 GFR 85 cc/min  cholesterol 148 HDL 78 LDL 45 TSH normal.  CBC same date normal  04/09/2018: Hemoglobin 12.5; Platelets 184 05/19/2018: ALT 21; BUN 12; Creatinine, Ser 0.58; Potassium 4.7; Sodium 137  Recent Lipid Panel    Component Value Date/Time   CHOL 131 05/19/2018 1021   TRIG 91 05/19/2018 1021   HDL 69 05/19/2018 1021   CHOLHDL 1.9 05/19/2018 1021   LDLCALC 44 05/19/2018 1021    Physical Exam:    VS:  BP 126/68  (BP Location: Right Arm, Patient Position:  Sitting, Cuff Size: Normal)   Pulse 66   Temp 98.2 F (36.8 C)   Ht 5\' 1"  (1.549 m)   Wt 141 lb (64 kg)   SpO2 98%   BMI 26.64 kg/m     Wt Readings from Last 3 Encounters:  02/22/19 141 lb (64 kg)  11/02/18 138 lb 8 oz (62.8 kg)  05/19/18 145 lb (65.8 kg)     GEN:  Well nourished, well developed in no acute distress HEENT: Normal NECK: No JVD; No carotid bruits LYMPHATICS: No lymphadenopathy CARDIAC: RRR, no murmurs, rubs, gallops RESPIRATORY:  Clear to auscultation without rales, wheezing or rhonchi  ABDOMEN: Soft, non-tender, non-distended MUSCULOSKELETAL:  No edema; No deformity  SKIN: Warm and dry NEUROLOGIC:  Alert and oriented x 3 PSYCHIATRIC:  Normal affect    Signed, [name], MD  02/22/2019 8:17 AM    [place]

## 2019-02-22 ENCOUNTER — Encounter: Payer: Self-pay | Admitting: Cardiology

## 2019-02-22 ENCOUNTER — Ambulatory Visit (INDEPENDENT_AMBULATORY_CARE_PROVIDER_SITE_OTHER): Payer: PPO | Admitting: Cardiology

## 2019-02-22 ENCOUNTER — Encounter: Payer: Self-pay | Admitting: *Deleted

## 2019-02-22 VITALS — BP 126/68 | HR 66 | Temp 98.2°F | Ht 61.0 in | Wt 141.0 lb

## 2019-02-22 DIAGNOSIS — I25118 Atherosclerotic heart disease of native coronary artery with other forms of angina pectoris: Secondary | ICD-10-CM | POA: Diagnosis not present

## 2019-02-22 DIAGNOSIS — R0602 Shortness of breath: Secondary | ICD-10-CM | POA: Diagnosis not present

## 2019-02-22 DIAGNOSIS — I10 Essential (primary) hypertension: Secondary | ICD-10-CM

## 2019-02-22 DIAGNOSIS — E785 Hyperlipidemia, unspecified: Secondary | ICD-10-CM | POA: Diagnosis not present

## 2019-02-22 NOTE — Patient Instructions (Addendum)
Medication Instructions:  Your physician recommends that you continue on your current medications as directed. Please refer to the Current Medication list given to you today.  If you need a refill on your cardiac medications before your next appointment, please call your pharmacy.   Lab work: None  If you have labs (blood work) drawn today and your tests are completely normal, you will receive your results only by: Marland Kitchen MyChart Message (if you have MyChart) OR . A paper copy in the mail If you have any lab test that is abnormal or we need to change your treatment, we will call you to review the results.  Testing/Procedures: You had an EKG today.   Your physician has requested that you have a lexiscan myoview. For further information please visit [email or web address]. Please follow instruction sheet, as given.   Follow-Up: At [facility], you and your health needs are our priority.  As part of our continuing mission to provide you with exceptional heart care, we have created designated Provider Care Teams.  These Care Teams include your primary Cardiologist (physician) and Advanced Practice Providers (APPs -  Physician Assistants and Nurse Practitioners) who all work together to provide you with the care you need, when you need it. You will need a follow up appointment in 3 months.  Please call our office 2 months in advance to schedule this appointment.       Cardiac Nuclear Scan A cardiac nuclear scan is a test that is done to check the flow of blood to your heart. It is done when you are resting and when you are exercising. The test looks for problems such as:  Not enough blood reaching a portion of the heart.  The heart muscle not working as it should. You may need this test if:  You have heart disease.  You have had lab results that are not normal.  You have had heart surgery or a balloon procedure to open up blocked arteries (angioplasty).  You have chest pain.  You have  shortness of breath. In this test, a special dye (tracer) is put into your bloodstream. The tracer will travel to your heart. A camera will then take pictures of your heart to see how the tracer moves through your heart. This test is usually done at a hospital and takes 2-4 hours. Tell a doctor about:  Any allergies you have.  All medicines you are taking, including vitamins, herbs, eye drops, creams, and over-the-counter medicines.  Any problems you or family members have had with anesthetic medicines.  Any blood disorders you have.  Any surgeries you have had.  Any medical conditions you have.  Whether you are pregnant or may be pregnant. What are the risks? Generally, this is a safe test. However, problems may occur, such as:  Serious chest pain and heart attack. This is only a risk if the stress portion of the test is done.  Rapid heartbeat.  A feeling of warmth in your chest. This feeling usually does not last long.  Allergic reaction to the tracer. What happens before the test?  Ask your doctor about changing or stopping your normal medicines. This is important.  Follow instructions from your doctor about what you cannot eat or drink.  Remove your jewelry on the day of the test. What happens during the test?  An IV tube will be inserted into one of your veins.  Your doctor will give you a small amount of tracer through the IV tube.  You  will wait for 20-40 minutes while the tracer moves through your bloodstream.  Your heart will be monitored with an electrocardiogram (ECG).  You will lie down on an exam table.  Pictures of your heart will be taken for about 15-20 minutes.  You may also have a stress test. For this test, one of these things may be done: ? You will be asked to exercise on a treadmill or a stationary bike. ? You will be given medicines that will make your heart work harder. This is done if you are unable to exercise.  When blood flow to your  heart has peaked, a tracer will again be given through the IV tube.  After 20-40 minutes, you will get back on the exam table. More pictures will be taken of your heart.  Depending on the tracer that is used, more pictures may need to be taken 3-4 hours later.  Your IV tube will be removed when the test is over. The test may vary among doctors and hospitals. What happens after the test?  Ask your doctor: ? Whether you can return to your normal schedule, including diet, activities, and medicines. ? Whether you should drink more fluids. This will help to remove the tracer from your body. Drink enough fluid to keep your pee (urine) pale yellow.  Ask your doctor, or the department that is doing the test: ? When will my results be ready? ? How will I get my results? Summary  A cardiac nuclear scan is a test that is done to check the flow of blood to your heart.  Tell your doctor whether you are pregnant or may be pregnant.  Before the test, ask your doctor about changing or stopping your normal medicines. This is important.  Ask your doctor whether you can return to your normal activities. You may be asked to drink more fluids. This information is not intended to replace advice given to you by your health care provider. Make sure you discuss any questions you have with your health care provider. Document Released: 12/29/2017 Document Revised: 11/04/2018 Document Reviewed: 12/29/2017 Elsevier Patient Education  2020 [employer].

## 2019-03-14 DIAGNOSIS — N3000 Acute cystitis without hematuria: Secondary | ICD-10-CM | POA: Diagnosis not present

## 2019-03-14 DIAGNOSIS — N390 Urinary tract infection, site not specified: Secondary | ICD-10-CM | POA: Diagnosis not present

## 2019-03-18 DIAGNOSIS — M1712 Unilateral primary osteoarthritis, left knee: Secondary | ICD-10-CM | POA: Diagnosis not present

## 2019-03-23 ENCOUNTER — Telehealth (HOSPITAL_COMMUNITY): Payer: Self-pay | Admitting: *Deleted

## 2019-03-23 NOTE — Telephone Encounter (Signed)
Left message on voicemail per DPR in reference to upcoming appointment scheduled on 03/31/19 with detailed instructions given per Myocardial Perfusion Study Information Sheet for the test. LM to arrive 15 minutes early, and that it is imperative to arrive on time for appointment to keep from having the test rescheduled. If you need to cancel or reschedule your appointment, please call the office within 24 hours of your appointment. Failure to do so may result in a cancellation of your appointment, and a $50 no show fee. Phone number given for call back for any questions. Kirstie Peri

## 2019-03-31 ENCOUNTER — Other Ambulatory Visit: Payer: Self-pay

## 2019-03-31 ENCOUNTER — Ambulatory Visit (INDEPENDENT_AMBULATORY_CARE_PROVIDER_SITE_OTHER): Payer: PPO

## 2019-03-31 DIAGNOSIS — R0602 Shortness of breath: Secondary | ICD-10-CM

## 2019-03-31 LAB — MYOCARDIAL PERFUSION IMAGING
LV dias vol: 40 mL (ref 46–106)
LV sys vol: 5 mL
Peak HR: 100 {beats}/min
Rest HR: 55 {beats}/min
SDS: 1
SRS: 0
SSS: 1
TID: 0.83

## 2019-03-31 MED ORDER — TECHNETIUM TC 99M TETROFOSMIN IV KIT
32.1000 | PACK | Freq: Once | INTRAVENOUS | Status: AC | PRN
  Administered 2019-03-31: 32.1 via INTRAVENOUS

## 2019-03-31 MED ORDER — REGADENOSON 0.4 MG/5ML IV SOLN
0.4000 mg | Freq: Once | INTRAVENOUS | Status: AC
  Administered 2019-03-31: 0.4 mg via INTRAVENOUS

## 2019-03-31 MED ORDER — TECHNETIUM TC 99M TETROFOSMIN IV KIT
10.9000 | PACK | Freq: Once | INTRAVENOUS | Status: AC | PRN
  Administered 2019-03-31: 10.9 via INTRAVENOUS

## 2019-04-02 ENCOUNTER — Ambulatory Visit: Payer: PPO | Admitting: Cardiology

## 2019-04-27 ENCOUNTER — Telehealth: Payer: Self-pay | Admitting: Cardiology

## 2019-04-27 NOTE — Telephone Encounter (Signed)
   Primary Cardiologist: [name], MD  Chart reviewed as part of pre-operative protocol coverage. Patient was contacted 04/27/2019 in reference to pre-operative risk assessment for pending surgery as outlined below.  Pamela Dominguez was last seen on 02/22/19 by Dr. Bettina Gavia. She underwent nuclear stress testing in 03/2019 that was negative for reversible ischemia. This was performed for complaints of SOB. She has not had chest pain since her PCI in 2019. She is able to complete 4.0 METS without anginal symptoms.   Therefore, based on ACC/AHA guidelines, the patient would be at acceptable risk for the planned procedure without further cardiovascular testing.   I will route this recommendation to the requesting party via Epic fax function and remove from pre-op pool.  Please call with questions.  Hart, PA 04/27/2019, 4:27 PM

## 2019-04-27 NOTE — Telephone Encounter (Signed)
New Message       [facility] Pre-operative Risk Assessment    Request for surgical clearance:  1. What type of surgery is being performed? Eyelid Surgery  2. When is this surgery scheduled? 05/07/19  3. What type of clearance is required (medical clearance vs. Pharmacy clearance to hold med vs. Both)? Medical they already have pharmacy clearance.  4. Are there any medications that need to be held prior to surgery and how long? N/A  5. Practice name and name of physician performing surgery? [facility] Dr. Shelbie Proctor  6. What is your office phone number [phone]   7.   What is your office fax number [phone]  8.   Anesthesia type (None, local, MAC, general) ? Local IV sedation   Andree Coss 04/27/2019, 1:44 PM  _________________________________________________________________   (provider comments below)

## 2019-04-29 DIAGNOSIS — M1712 Unilateral primary osteoarthritis, left knee: Secondary | ICD-10-CM | POA: Diagnosis not present

## 2019-05-07 DIAGNOSIS — Z01818 Encounter for other preprocedural examination: Secondary | ICD-10-CM | POA: Diagnosis not present

## 2019-05-07 DIAGNOSIS — H02423 Myogenic ptosis of bilateral eyelids: Secondary | ICD-10-CM | POA: Diagnosis not present

## 2019-05-07 DIAGNOSIS — H02403 Unspecified ptosis of bilateral eyelids: Secondary | ICD-10-CM | POA: Diagnosis not present

## 2019-05-07 DIAGNOSIS — H53453 Other localized visual field defect, bilateral: Secondary | ICD-10-CM | POA: Diagnosis not present

## 2019-05-17 ENCOUNTER — Ambulatory Visit: Payer: PPO | Admitting: Cardiology

## 2019-07-01 ENCOUNTER — Other Ambulatory Visit: Payer: Self-pay

## 2019-07-01 ENCOUNTER — Encounter: Payer: Self-pay | Admitting: Cardiology

## 2019-07-01 ENCOUNTER — Ambulatory Visit (INDEPENDENT_AMBULATORY_CARE_PROVIDER_SITE_OTHER): Payer: PPO | Admitting: Cardiology

## 2019-07-01 VITALS — BP 124/66 | HR 68 | Ht 61.0 in | Wt 144.0 lb

## 2019-07-01 DIAGNOSIS — I25118 Atherosclerotic heart disease of native coronary artery with other forms of angina pectoris: Secondary | ICD-10-CM | POA: Diagnosis not present

## 2019-07-01 DIAGNOSIS — E785 Hyperlipidemia, unspecified: Secondary | ICD-10-CM | POA: Diagnosis not present

## 2019-07-01 DIAGNOSIS — I10 Essential (primary) hypertension: Secondary | ICD-10-CM

## 2019-07-01 NOTE — Progress Notes (Signed)
Cardiology Office Note:    Date:  07/01/2019   ID:  Pamela Dominguez, DOB Feb 21, 1933, MRN [id number]  PCP:  [name]., MD  Cardiologist:  [name], MD    Referring MD: [name]., MD    ASSESSMENT:    1. Coronary artery disease of native artery of native heart with stable angina pectoris ([place])   2. Essential hypertension   3. Hyperlipidemia LDL goal <70    PLAN:    In order of problems listed above:  1. Stable CAD having no angina on current treatment [place] Heart Association class I continue her dual antiplatelet therapy and rate limiting calcium channel blocker and lipid-lowering therapy with statin and cholestyramine 2. Stable BP at target continue combination ARB calcium channel blocker 3. Continue current treatment check CMP and lipid profile overdue   Next appointment: 6 months   Medication Adjustments/Labs and Tests Ordered: Current medicines are reviewed at length with the patient today.  Concerns regarding medicines are outlined above.  Orders Placed This Encounter  Procedures  . Comp Met (CMET)  . Lipid Profile   No orders of the defined types were placed in this encounter.   Chief Complaint  Patient presents with  . Follow-up  . Coronary Artery Disease    History of Present Illness:     Pamela Dominguez is a 85 y.o. female with a hx of hypertension chest pain exertional shortness of breath she was found to have class III typical angina pectoris was referred for cardiac CTA which shows flow-limiting stenosis in the proximal left anterior descending coronary artery. Coronary angiography 04/08/18 showed severe LAD stenosis with PCI and DES performed. She was last seen 02/22/2019. Compliance with diet, lifestyle and medications: Yes  I reviewed her myocardial perfusion study with her and she is reassured.  MPI 03/31/2019: Nuclear Stress Findings Isotope administration Rest isotope was administered  with an IV injection of 10.9 mCi Tc26m Tetrofosmin.  Rest SPECT images were obtained approximately 45 minutes post tracer injection.  Stress isotope was administered  with an IV injection of 32.1 mCi Tc954metrofosmin   Stress SPECT images were obtained approximately 60 minutes post tracer injection.  Nuclear Study Quality Overall image quality is good.  Nuclear Measurements Study was gated.  Rest Perfusion Rest perfusion normal.  Stress Perfusion Stress perfusion normal.  Overall Study Impression Myocardial perfusion is normal.   The study is normal.   This is a low risk study.  Overall left ventricular systolic function was normal.    Nuclear stress EF:  88%.  The left ventricular ejection fraction is hyperdynamic (>65%).     02/05/2019: CMP is normal including liver function renal function, cholesterol 148 LDL 45 HDL 78 TSH is normal and CBC is normal hemoglobin 13.4  She has had no further angina and no further falls.  No shortness of breath chest pain palpitation.  She has no Covid exposure wears a mask and is interested in eye protection. Past Medical History:  Diagnosis Date  . Abnormal CXR 01/21/2018  . Arthritis    "all over" (04/08/2018)  . CAD S/P percutaneous coronary angioplasty and stent DES mLAD beyond 2nd diag 04/08/2018  . Chest pain 01/22/2018  . Chronic edema 05/19/2017  . Chronic lower back pain   . Complication of anesthesia 1989   mouth joint trouble after hysterctomy, told by dentist to haver anesthesia use bite block to keep mouth open same amount all over  . Coronary artery disease   .  Diverticulosis   . Diverticulosis of colon 09/28/2013  . Diverticulosis of colon (without mention of hemorrhage) 09/28/2013  . DJD (degenerative joint disease)   . Essential hypertension 09/18/2015   Last Assessment & Plan:  Relevant Hx: Course: Daily Update: Today's Plan:this is stable for her at this time and will follow and on repeat she was much better overall Electronically signed by: Mayer Camel, NP 09/18/15  1146  . Family history of anesthesia complication    brother low bp, heart problems, ileus  . GERD (gastroesophageal reflux disease)   . High risk medication use 09/18/2015  . History of diverticulosis 08/28/2017  . Hyperlipidemia   . Hyperlipidemia, unspecified 09/18/2015   Last Assessment & Plan:  Update her lipids for her fasting  . Hypertension   . IBS (irritable bowel syndrome)   . Irritable bowel syndrome 09/28/2013  . Irritable bowel syndrome with both constipation and diarrhea 09/18/2015   Last Assessment & Plan:  She has more fecal incontinence and she has to wear a pad and that is part of her UTI issues, though the Lucrezia Starch has helped her about 75% to diminish this and for that she is pleased.  . OA (osteoarthritis) of knee 09/27/2013  . Osteopenia   . Other and unspecified hyperlipidemia 09/28/2013  . Pneumonia 1955  . Squamous carcinoma ~ 2009   "right ear"  . TMJ (dislocation of temporomandibular joint)   . Unspecified essential hypertension 09/28/2013    Past Surgical History:  Procedure Laterality Date  . ABDOMINAL HYSTERECTOMY  1989  . APPENDECTOMY  1948  . BROW LIFT AND BLEPHAROPLASTY    . CHOLECYSTECTOMY  1999  . COLONOSCOPY  2013  . CORONARY ANGIOPLASTY WITH STENT PLACEMENT  04/08/2018  . CORONARY STENT INTERVENTION N/A 04/08/2018   Procedure: CORONARY STENT INTERVENTION;  Surgeon: Leonie Man, MD;  Location: [facility];  Service: Cardiovascular;  Laterality: N/A;  . JOINT REPLACEMENT    . KNEE ARTHROSCOPY Left 1996  . LEFT HEART CATH AND CORONARY ANGIOGRAPHY N/A 04/08/2018   Procedure: LEFT HEART CATH AND CORONARY ANGIOGRAPHY;  Surgeon: Leonie Man, MD;  Location: [facility];  Service: Cardiovascular;  Laterality: N/A;  . NASAL SINUS SURGERY  1968  . SQUAMOUS CELL CARCINOMA EXCISION Right    "ear"  . TONSILLECTOMY AND ADENOIDECTOMY  1942  . TOTAL KNEE ARTHROPLASTY Right 09/27/2013   Procedure: RIGHT TOTAL KNEE ARTHROPLASTY;  Surgeon: Gearlean Alf, MD;  Location: [facility];  Service: Orthopedics;  Laterality: Right;    Current Medications: Current Meds  Medication Sig  . acetaminophen (TYLENOL) 650 MG CR tablet Take 650 mg by mouth daily as needed for pain.  Marland Kitchen amLODipine-valsartan (EXFORGE) 10-320 MG tablet Take 1 tablet by mouth daily.  Marland Kitchen aspirin EC 81 MG tablet Take 81 mg by mouth daily.  Marland Kitchen atorvastatin (LIPITOR) 40 MG tablet Take 1 tablet (40 mg total) by mouth daily at 6 PM.  . b complex vitamins tablet Take 1 tablet by mouth daily.  . Biotin 10000 MCG TABS Take 10,000 mcg by mouth daily.  . cetirizine (ZYRTEC) 10 MG tablet Take 10 mg by mouth daily.  . cholestyramine (QUESTRAN) 4 G packet Take 4 g by mouth at bedtime.  . clopidogrel (PLAVIX) 75 MG tablet Take 1 tablet (75 mg total) by mouth daily with breakfast.  . famotidine (PEPCID) 20 MG tablet Take 1 tablet by mouth 2 (two) times daily.  Marland Kitchen glucosamine-chondroitin 500-400 MG tablet Take 1 tablet by  mouth 2 (two) times daily.  . Melatonin 5 MG CAPS Take 5 mg by mouth at bedtime as needed (sleep).  . Multiple Vitamin (MULTIVITAMIN) tablet Take 1 tablet by mouth daily.  . Multiple Vitamins-Minerals (OCUVITE PO) Take 1 tablet by mouth daily.  . nitroGLYCERIN (NITROSTAT) 0.4 MG SL tablet Place 1 tablet (0.4 mg total) under the tongue every 5 (five) minutes as needed for chest pain.  . pantoprazole (PROTONIX) 40 MG tablet Take 1 tablet (40 mg total) by mouth daily.  Vladimir Faster Glycol-Propyl Glycol (SYSTANE OP) Place 1 drop into both eyes daily as needed (dry eyes).  Marland Kitchen spironolactone (ALDACTONE) 25 MG tablet Take 25 mg by mouth 2 (two) times daily.   . [DISCONTINUED] loperamide (IMODIUM A-D) 2 MG tablet Take 4 mg by mouth as needed for diarrhea or loose stools.     Allergies:   Actonel [risedronate sodium], Alendronate, Nizatidine, Penicillins, and Quinine   Social History   Socioeconomic History  . Marital status: Married    Spouse name: Not on file  . Number of  children: Not on file  . Years of education: Not on file  . Highest education level: Not on file  Occupational History  . Not on file  Social Needs  . Financial resource strain: Not on file  . Food insecurity    Worry: Not on file    Inability: Not on file  . Transportation needs    Medical: Not on file    Non-medical: Not on file  Tobacco Use  . Smoking status: Never Smoker  . Smokeless tobacco: Never Used  Substance and Sexual Activity  . Alcohol use: Never    Frequency: Never  . Drug use: Never  . Sexual activity: Yes  Lifestyle  . Physical activity    Days per week: Not on file    Minutes per session: Not on file  . Stress: Not on file  Relationships  . Social Herbalist on phone: Not on file    Gets together: Not on file    Attends religious service: Not on file    Active member of club or organization: Not on file    Attends meetings of clubs or organizations: Not on file    Relationship status: Not on file  Other Topics Concern  . Not on file  Social History Narrative  . Not on file     Family History: The patient's family history includes Atrial fibrillation in her brother; Cancer in her father; Hypertension in her mother. ROS:   Please see the history of present illness.    All other systems reviewed and are negative.  EKGs/Labs/Other Studies Reviewed:    The following studies were reviewed today:  EKG: I did not order another EKG today  Recent Labs: 02/05/2019: CMP normal except for sodium 130 cholesterol 148 LDL 45 HDL 78 TSH normal  Recent Lipid Panel    Component Value Date/Time   CHOL 131 05/19/2018 1021   TRIG 91 05/19/2018 1021   HDL 69 05/19/2018 1021   CHOLHDL 1.9 05/19/2018 1021   LDLCALC 44 05/19/2018 1021    Physical Exam:    VS:  BP 124/66 (BP Location: Left Arm, Patient Position: Sitting, Cuff Size: Normal)   Pulse 68   Ht _0  (1.549 m)   Wt 144 lb (65.3 kg)   SpO2 98%   BMI 27.21 kg/m     Wt Readings from  Last 3 Encounters:  07/01/19 144 lb (65.3  kg)  03/31/19 141 lb (64 kg)  02/22/19 141 lb (64 kg)     GEN:  Well nourished, well developed in no acute distress HEENT: Normal NECK: No JVD; No carotid bruits LYMPHATICS: No lymphadenopathy CARDIAC: RRR, no murmurs, rubs, gallops RESPIRATORY:  Clear to auscultation without rales, wheezing or rhonchi  ABDOMEN: Soft, non-tender, non-distended MUSCULOSKELETAL:  No edema; No deformity  SKIN: Warm and dry NEUROLOGIC:  Alert and oriented x 3 PSYCHIATRIC:  Normal affect    Signed, [name], MD  07/01/2019 9:52 AM    Time

## 2019-07-01 NOTE — Patient Instructions (Addendum)
Medication Instructions:  Your physician recommends that you continue on your current medications as directed. Please refer to the Current Medication list given to you today.  *If you need a refill on your cardiac medications before your next appointment, please call your pharmacy*  Lab Work: Your physician recommends that you return for lab work today: CMP, lipid panel.   If you have labs (blood work) drawn today and your tests are completely normal, you will receive your results only by: Marland Kitchen MyChart Message (if you have MyChart) OR . A paper copy in the mail If you have any lab test that is abnormal or we need to change your treatment, we will call you to review the results.  Testing/Procedures: None  Follow-Up: At [facility], you and your health needs are our priority.  As part of our continuing mission to provide you with exceptional heart care, we have created designated Provider Care Teams.  These Care Teams include your primary Cardiologist (physician) and Advanced Practice Providers (APPs -  Physician Assistants and Nurse Practitioners) who all work together to provide you with the care you need, when you need it.  Your next appointment:   6 month(s)  The format for your next appointment:   In Person  Provider:   [name], MD     Purchase on line at [facility] or at [facility]

## 2019-07-02 LAB — COMPREHENSIVE METABOLIC PANEL
ALT: 25 IU/L (ref 0–32)
AST: 17 IU/L (ref 0–40)
Albumin/Globulin Ratio: 2.4 — ABNORMAL HIGH (ref 1.2–2.2)
Albumin: 4.7 g/dL — ABNORMAL HIGH (ref 3.6–4.6)
Alkaline Phosphatase: 94 IU/L (ref 39–117)
BUN/Creatinine Ratio: 21 (ref 12–28)
BUN: 14 mg/dL (ref 8–27)
Bilirubin Total: 0.5 mg/dL (ref 0.0–1.2)
CO2: 23 mmol/L (ref 20–29)
Calcium: 9.8 mg/dL (ref 8.7–10.3)
Chloride: 98 mmol/L (ref 96–106)
Creatinine, Ser: 0.67 mg/dL (ref 0.57–1.00)
GFR calc Af Amer: 93 mL/min/{1.73_m2} (ref >59)
GFR calc non Af Amer: 81 mL/min/{1.73_m2} (ref >59)
Globulin, Total: 2 g/dL (ref 1.5–4.5)
Glucose: 89 mg/dL (ref 65–99)
Potassium: 4.5 mmol/L (ref 3.5–5.2)
Sodium: 137 mmol/L (ref 134–144)
Total Protein: 6.7 g/dL (ref 6.0–8.5)

## 2019-07-02 LAB — LIPID PANEL
Chol/HDL Ratio: 1.8 ratio (ref 0.0–4.4)
Cholesterol, Total: 138 mg/dL (ref 100–199)
HDL: 75 mg/dL (ref >39)
LDL Chol Calc (NIH): 45 mg/dL (ref 0–99)
Triglycerides: 100 mg/dL (ref 0–149)
VLDL Cholesterol Cal: 18 mg/dL (ref 5–40)

## 2019-08-09 DIAGNOSIS — K219 Gastro-esophageal reflux disease without esophagitis: Secondary | ICD-10-CM | POA: Diagnosis not present

## 2019-08-09 DIAGNOSIS — I2510 Atherosclerotic heart disease of native coronary artery without angina pectoris: Secondary | ICD-10-CM | POA: Diagnosis not present

## 2019-08-09 DIAGNOSIS — K582 Mixed irritable bowel syndrome: Secondary | ICD-10-CM | POA: Diagnosis not present

## 2019-08-09 DIAGNOSIS — E782 Mixed hyperlipidemia: Secondary | ICD-10-CM | POA: Diagnosis not present

## 2019-08-09 DIAGNOSIS — Z79899 Other long term (current) drug therapy: Secondary | ICD-10-CM | POA: Diagnosis not present

## 2019-08-09 DIAGNOSIS — Z0000 Encounter for general adult medical examination without abnormal findings: Secondary | ICD-10-CM | POA: Diagnosis not present

## 2019-08-09 DIAGNOSIS — M8949 Other hypertrophic osteoarthropathy, multiple sites: Secondary | ICD-10-CM | POA: Diagnosis not present

## 2019-08-09 DIAGNOSIS — I10 Essential (primary) hypertension: Secondary | ICD-10-CM | POA: Diagnosis not present

## 2019-08-09 DIAGNOSIS — R609 Edema, unspecified: Secondary | ICD-10-CM | POA: Diagnosis not present

## 2019-08-25 DIAGNOSIS — K581 Irritable bowel syndrome with constipation: Secondary | ICD-10-CM | POA: Diagnosis not present

## 2019-08-25 DIAGNOSIS — R1013 Epigastric pain: Secondary | ICD-10-CM | POA: Diagnosis not present

## 2019-08-25 DIAGNOSIS — K219 Gastro-esophageal reflux disease without esophagitis: Secondary | ICD-10-CM | POA: Diagnosis not present

## 2019-09-15 DIAGNOSIS — Z20828 Contact with and (suspected) exposure to other viral communicable diseases: Secondary | ICD-10-CM | POA: Diagnosis not present

## 2019-09-15 DIAGNOSIS — Z20822 Contact with and (suspected) exposure to covid-19: Secondary | ICD-10-CM | POA: Diagnosis not present

## 2019-09-21 DIAGNOSIS — K449 Diaphragmatic hernia without obstruction or gangrene: Secondary | ICD-10-CM | POA: Diagnosis not present

## 2019-09-21 DIAGNOSIS — I2510 Atherosclerotic heart disease of native coronary artery without angina pectoris: Secondary | ICD-10-CM | POA: Diagnosis not present

## 2019-09-21 DIAGNOSIS — K317 Polyp of stomach and duodenum: Secondary | ICD-10-CM | POA: Diagnosis not present

## 2019-09-21 DIAGNOSIS — I10 Essential (primary) hypertension: Secondary | ICD-10-CM | POA: Diagnosis not present

## 2019-09-21 DIAGNOSIS — Z7982 Long term (current) use of aspirin: Secondary | ICD-10-CM | POA: Diagnosis not present

## 2019-09-21 DIAGNOSIS — K208 Other esophagitis without bleeding: Secondary | ICD-10-CM | POA: Diagnosis not present

## 2019-09-21 DIAGNOSIS — Z79899 Other long term (current) drug therapy: Secondary | ICD-10-CM | POA: Diagnosis not present

## 2019-09-21 DIAGNOSIS — Z955 Presence of coronary angioplasty implant and graft: Secondary | ICD-10-CM | POA: Diagnosis not present

## 2019-09-21 DIAGNOSIS — D131 Benign neoplasm of stomach: Secondary | ICD-10-CM | POA: Diagnosis not present

## 2019-09-21 DIAGNOSIS — Z7902 Long term (current) use of antithrombotics/antiplatelets: Secondary | ICD-10-CM | POA: Diagnosis not present

## 2019-09-21 DIAGNOSIS — K219 Gastro-esophageal reflux disease without esophagitis: Secondary | ICD-10-CM | POA: Diagnosis not present

## 2019-09-21 DIAGNOSIS — K2101 Gastro-esophageal reflux disease with esophagitis, with bleeding: Secondary | ICD-10-CM | POA: Diagnosis not present

## 2019-09-21 DIAGNOSIS — K2950 Unspecified chronic gastritis without bleeding: Secondary | ICD-10-CM | POA: Diagnosis not present

## 2019-09-21 DIAGNOSIS — R1310 Dysphagia, unspecified: Secondary | ICD-10-CM | POA: Diagnosis not present

## 2019-10-29 ENCOUNTER — Telehealth: Payer: Self-pay

## 2019-10-29 MED ORDER — ATORVASTATIN CALCIUM 40 MG PO TABS: 40.0000 mg | ORAL_TABLET | Freq: Every day | ORAL | 3 refills | Status: DC

## 2019-10-29 NOTE — Telephone Encounter (Signed)
Prescription for Atorvastatin was sent in to [facility] mail order pharmacy per faxed request.

## 2019-11-03 DIAGNOSIS — D126 Benign neoplasm of colon, unspecified: Secondary | ICD-10-CM | POA: Diagnosis not present

## 2019-11-03 DIAGNOSIS — R1013 Epigastric pain: Secondary | ICD-10-CM | POA: Diagnosis not present

## 2019-11-03 DIAGNOSIS — K5730 Diverticulosis of large intestine without perforation or abscess without bleeding: Secondary | ICD-10-CM | POA: Diagnosis not present

## 2019-11-10 DIAGNOSIS — R600 Localized edema: Secondary | ICD-10-CM | POA: Diagnosis not present

## 2019-11-10 DIAGNOSIS — I2510 Atherosclerotic heart disease of native coronary artery without angina pectoris: Secondary | ICD-10-CM | POA: Diagnosis not present

## 2019-11-10 DIAGNOSIS — R609 Edema, unspecified: Secondary | ICD-10-CM | POA: Diagnosis not present

## 2019-11-10 DIAGNOSIS — M8949 Other hypertrophic osteoarthropathy, multiple sites: Secondary | ICD-10-CM | POA: Diagnosis not present

## 2019-11-11 DIAGNOSIS — I493 Ventricular premature depolarization: Secondary | ICD-10-CM | POA: Diagnosis not present

## 2019-11-16 DIAGNOSIS — G8929 Other chronic pain: Secondary | ICD-10-CM | POA: Diagnosis not present

## 2019-11-16 DIAGNOSIS — M25511 Pain in right shoulder: Secondary | ICD-10-CM | POA: Diagnosis not present

## 2019-11-16 DIAGNOSIS — M25551 Pain in right hip: Secondary | ICD-10-CM | POA: Diagnosis not present

## 2019-11-16 DIAGNOSIS — M5442 Lumbago with sciatica, left side: Secondary | ICD-10-CM | POA: Diagnosis not present

## 2019-11-16 DIAGNOSIS — M5441 Lumbago with sciatica, right side: Secondary | ICD-10-CM | POA: Diagnosis not present

## 2019-11-29 ENCOUNTER — Encounter: Payer: Self-pay | Admitting: Cardiology

## 2019-11-29 ENCOUNTER — Other Ambulatory Visit: Payer: Self-pay

## 2019-11-29 ENCOUNTER — Ambulatory Visit: Payer: PPO | Admitting: Cardiology

## 2019-11-29 VITALS — BP 132/68 | HR 90 | Temp 97.0°F | Ht 61.0 in | Wt 144.8 lb

## 2019-11-29 DIAGNOSIS — E785 Hyperlipidemia, unspecified: Secondary | ICD-10-CM | POA: Diagnosis not present

## 2019-11-29 DIAGNOSIS — I10 Essential (primary) hypertension: Secondary | ICD-10-CM | POA: Diagnosis not present

## 2019-11-29 DIAGNOSIS — I25118 Atherosclerotic heart disease of native coronary artery with other forms of angina pectoris: Secondary | ICD-10-CM | POA: Diagnosis not present

## 2019-11-29 DIAGNOSIS — R600 Localized edema: Secondary | ICD-10-CM | POA: Diagnosis not present

## 2019-11-29 MED ORDER — VALSARTAN 320 MG PO TABS: 320.0000 mg | ORAL_TABLET | Freq: Every day | ORAL | 3 refills | Status: AC

## 2019-11-29 NOTE — Patient Instructions (Signed)
Medication Instructions:  Your physician has recommended you make the following change in your medication:  START: Valsartan 320 mg take one tablet by mouth daily.  STOP: EXFORGE  *If you need a refill on your cardiac medications before your next appointment, please call your pharmacy*   Lab Work: None If you have labs (blood work) drawn today and your tests are completely normal, you will receive your results only by: Marland Kitchen MyChart Message (if you have MyChart) OR . A paper copy in the mail If you have any lab test that is abnormal or we need to change your treatment, we will call you to review the results.   Testing/Procedures: None   Follow-Up: At [facility], you and your health needs are our priority.  As part of our continuing mission to provide you with exceptional heart care, we have created designated Provider Care Teams.  These Care Teams include your primary Cardiologist (physician) and Advanced Practice Providers (APPs -  Physician Assistants and Nurse Practitioners) who all work together to provide you with the care you need, when you need it.  We recommend signing up for the patient portal called "MyChart".  Sign up information is provided on this After Visit Summary.  MyChart is used to connect with patients for Virtual Visits (Telemedicine).  Patients are able to view lab/test results, encounter notes, upcoming appointments, etc.  Non-urgent messages can be sent to your provider as well.   To learn more about what you can do with MyChart, go to [email or web address].    Your next appointment:   6 month(s)  The format for your next appointment:   In Person  Provider:   [name], MD   Other Instructions

## 2019-11-29 NOTE — Progress Notes (Signed)
Cardiology Office Note:    Date:  11/29/2019   ID:  Pamela Dominguez, DOB 06-04-1933, MRN [id number]  PCP:  [name]., MD  Cardiologist:  [name], MD    Referring MD: [name]., MD    ASSESSMENT:    1. Bilateral lower extremity edema   2. Coronary artery disease of native artery of native heart with stable angina pectoris ([place])   3. Essential hypertension   4. Hyperlipidemia LDL goal <70    PLAN:    In order of problems listed above:  1. Very typical of calcium channel blocker, its idiosyncratic drug related to dose and duration and she needs to stop her calcium channel blocker.  BP is at range and will continue the ARB portion of Exforge and monitor blood pressure at home.  If unimproved over time echocardiogram and proBNP level be appropriate. 2. Stable CAD she had a myocardial perfusion study 6 months ago normal she is not having anginal symptoms continue current medical treatment including dual antiplatelet 3. BP at target discontinue the calcium channel blocker 4. Continue statin lipids are ideal   Next appointment: 6 months   Medication Adjustments/Labs and Tests Ordered: Current medicines are reviewed at length with the patient today.  Concerns regarding medicines are outlined above.  No orders of the defined types were placed in this encounter.  No orders of the defined types were placed in this encounter.   Chief Complaint  Patient presents with  . Follow-up  . Coronary Artery Disease    History of Present Illness:    Pamela Dominguez is a 86 y.o. female with a hx of CAD,hypertension chest pain exertional shortness of breath she was found to have class III typical angina pectoris was referred for cardiac CTA which shows flow-limiting stenosis in the proximal left anterior descending coronary artery. Coronary angiography 04/08/18 showed severe LAD stenosis with PCI and DES performed.  She had a Myoview performed 03/31/2019 showed normal perfusion ejection  fraction 88% no ischemic ST segment abnormality study was normal low risk. She was last seen 07/01/2019. Compliance with diet, lifestyle and medications: Yes  She developed peripheral edema without shortness of breath takes a calcium channel blocker I think is the cause and as opposed to further cardiac testing murmur with strong and if edema persists she will need an echocardiogram.  She has had no angina dyspnea palpitation or syncope.  She inquires about stopping one of her antiplatelet agents she had a complex PCI done I think she should stay on both she said no bleeding complication I encouraged her to continue lipid-lowering therapy.  She is not having angina.  Recent labs from [facility] PCP: CMP normal except for glucose 106, GFR 81 cc potassium 4.1 normal liver function test. TSH normal 3.72 02/05/2019 cholesterol 148 triglycerides 108 HDL 78 LDL Past Medical History:  Diagnosis Date  . Abnormal CXR 01/21/2018  . Arthritis    "all over" (04/08/2018)  . CAD S/P percutaneous coronary angioplasty and stent DES mLAD beyond 2nd diag 04/08/2018  . Chest pain 01/22/2018  . Chronic edema 05/19/2017  . Chronic lower back pain   . Complication of anesthesia 1989   mouth joint trouble after hysterctomy, told by dentist to haver anesthesia use bite block to keep mouth open same amount all over  . Coronary artery disease   . Diverticulosis   . Diverticulosis of colon 09/28/2013  . Diverticulosis of colon (without mention of hemorrhage) 09/28/2013  . DJD (  degenerative joint disease)   . Essential hypertension 09/18/2015   Last Assessment & Plan:  Relevant Hx: Course: Daily Update: Today's Plan:this is stable for her at this time and will follow and on repeat she was much better overall Electronically signed by: Mayer Camel, NP 09/18/15 1146  . Family history of anesthesia complication    brother low bp, heart problems, ileus  . GERD (gastroesophageal reflux disease)   . High  risk medication use 09/18/2015  . History of diverticulosis 08/28/2017  . Hyperlipidemia   . Hyperlipidemia, unspecified 09/18/2015   Last Assessment & Plan:  Update her lipids for her fasting  . Hypertension   . IBS (irritable bowel syndrome)   . Irritable bowel syndrome 09/28/2013  . Irritable bowel syndrome with both constipation and diarrhea 09/18/2015   Last Assessment & Plan:  She has more fecal incontinence and she has to wear a pad and that is part of her UTI issues, though the Lucrezia Starch has helped her about 75% to diminish this and for that she is pleased.  . OA (osteoarthritis) of knee 09/27/2013  . Osteopenia   . Other and unspecified hyperlipidemia 09/28/2013  . Pneumonia 1955  . Squamous carcinoma ~ 2009   "right ear"  . TMJ (dislocation of temporomandibular joint)   . Unspecified essential hypertension 09/28/2013    Past Surgical History:  Procedure Laterality Date  . ABDOMINAL HYSTERECTOMY  1989  . APPENDECTOMY  1948  . BROW LIFT AND BLEPHAROPLASTY    . CHOLECYSTECTOMY  1999  . COLONOSCOPY  2013  . CORONARY ANGIOPLASTY WITH STENT PLACEMENT  04/08/2018  . CORONARY STENT INTERVENTION N/A 04/08/2018   Procedure: CORONARY STENT INTERVENTION;  Surgeon: Leonie Man, MD;  Location: [facility];  Service: Cardiovascular;  Laterality: N/A;  . JOINT REPLACEMENT    . KNEE ARTHROSCOPY Left 1996  . LEFT HEART CATH AND CORONARY ANGIOGRAPHY N/A 04/08/2018   Procedure: LEFT HEART CATH AND CORONARY ANGIOGRAPHY;  Surgeon: Leonie Man, MD;  Location: [facility];  Service: Cardiovascular;  Laterality: N/A;  . NASAL SINUS SURGERY  1968  . SQUAMOUS CELL CARCINOMA EXCISION Right    "ear"  . TONSILLECTOMY AND ADENOIDECTOMY  1942  . TOTAL KNEE ARTHROPLASTY Right 09/27/2013   Procedure: RIGHT TOTAL KNEE ARTHROPLASTY;  Surgeon: Gearlean Alf, MD;  Location: [facility];  Service: Orthopedics;  Laterality: Right;    Current Medications: Current Meds  Medication Sig  .  acetaminophen (TYLENOL) 650 MG CR tablet Take 650 mg by mouth daily as needed for pain.  Marland Kitchen amLODipine-valsartan (EXFORGE) 10-320 MG tablet Take 1 tablet by mouth daily.  Marland Kitchen aspirin EC 81 MG tablet Take 81 mg by mouth daily.  Marland Kitchen atorvastatin (LIPITOR) 40 MG tablet Take 1 tablet (40 mg total) by mouth daily at 6 PM.  . b complex vitamins tablet Take 1 tablet by mouth daily.  . Biotin 10000 MCG TABS Take 10,000 mcg by mouth daily.  . cetirizine (ZYRTEC) 10 MG tablet Take 10 mg by mouth daily.  . cholestyramine (QUESTRAN) 4 G packet Take 4 g by mouth at bedtime.  . clopidogrel (PLAVIX) 75 MG tablet Take 1 tablet (75 mg total) by mouth daily with breakfast.  . famotidine (PEPCID) 40 MG tablet Take 40 mg by mouth daily.  Marland Kitchen glucosamine-chondroitin 500-400 MG tablet Take 1 tablet by mouth 2 (two) times daily.  . Melatonin 5 MG CAPS Take 5 mg by mouth at bedtime as needed (sleep).  . Multiple  Vitamin (MULTIVITAMIN) tablet Take 1 tablet by mouth daily.  . Multiple Vitamins-Minerals (OCUVITE PO) Take 1 tablet by mouth daily.  . nitroGLYCERIN (NITROSTAT) 0.4 MG SL tablet Place 1 tablet (0.4 mg total) under the tongue every 5 (five) minutes as needed for chest pain.  . pantoprazole (PROTONIX) 40 MG tablet Take 1 tablet (40 mg total) by mouth daily.  Vladimir Faster Glycol-Propyl Glycol (SYSTANE OP) Place 1 drop into both eyes daily as needed (dry eyes).  Marland Kitchen spironolactone (ALDACTONE) 25 MG tablet Take 25 mg by mouth 2 (two) times daily.      Allergies:   Actonel [risedronate sodium], Alendronate, Nizatidine, Penicillins, and Quinine   Social History   Socioeconomic History  . Marital status: Married    Spouse name: Not on file  . Number of children: Not on file  . Years of education: Not on file  . Highest education level: Not on file  Occupational History  . Not on file  Tobacco Use  . Smoking status: Never Smoker  . Smokeless tobacco: Never Used  Substance and Sexual Activity  . Alcohol use: Never    . Drug use: Never  . Sexual activity: Yes  Other Topics Concern  . Not on file  Social History Narrative  . Not on file   Social Determinants of Health   Financial Resource Strain:   . Difficulty of Paying Living Expenses:   Food Insecurity:   . Worried About Charity fundraiser in the Last Year:   . Arboriculturist in the Last Year:   Transportation Needs:   . Film/video editor (Medical):   Marland Kitchen Lack of Transportation (Non-Medical):   Physical Activity:   . Days of Exercise per Week:   . Minutes of Exercise per Session:   Stress:   . Feeling of Stress :   Social Connections:   . Frequency of Communication with Friends and Family:   . Frequency of Social Gatherings with Friends and Family:   . Attends Religious Services:   . Active Member of Clubs or Organizations:   . Attends Archivist Meetings:   Marland Kitchen Marital Status:      Family History: The patient's family history includes Atrial fibrillation in her brother; Cancer in her father; Hypertension in her mother. ROS:   Please see the history of present illness.    All other systems reviewed and are negative.  EKGs/Labs/Other Studies Reviewed:    The following studies were reviewed today:  EKG:  EKG ordered today and personally reviewed.  The ekg ordered today demonstrates sinus rhythm occasional PVC otherwise normal  Recent Labs: 07/01/2019: ALT 25; BUN 14; Creatinine, Ser 0.67; Potassium 4.5; Sodium 137  Recent Lipid Panel    Component Value Date/Time   CHOL 138 07/01/2019 0957   TRIG 100 07/01/2019 0957   HDL 75 07/01/2019 0957   CHOLHDL 1.8 07/01/2019 0957   LDLCALC 45 07/01/2019 0957    Physical Exam:    VS:  BP 132/68   Pulse 90   Temp (!) 97 F (36.1 C)   Ht 5\' 1"  (1.549 m)   Wt 144 lb 12.8 oz (65.7 kg)   SpO2 95%   BMI 27.36 kg/m     Wt Readings from Last 3 Encounters:  11/29/19 144 lb 12.8 oz (65.7 kg)  07/01/19 144 lb (65.3 kg)  03/31/19 141 lb (64 kg)     GEN:  Well  nourished, well developed in no acute distress HEENT: Normal NECK:  No JVD; No carotid bruits LYMPHATICS: No lymphadenopathy CARDIAC: RRR, no murmurs, rubs, gallops RESPIRATORY:  Clear to auscultation without rales, wheezing or rhonchi  ABDOMEN: Soft, non-tender, non-distended MUSCULOSKELETAL:  No edema; No deformity  SKIN: Warm and dry NEUROLOGIC:  Alert and oriented x 3 PSYCHIATRIC:  Normal affect    Signed, [name], MD  11/29/2019 2:54 PM    [facility]

## 2019-12-06 DIAGNOSIS — M5416 Radiculopathy, lumbar region: Secondary | ICD-10-CM | POA: Diagnosis not present

## 2019-12-07 ENCOUNTER — Telehealth: Payer: Self-pay | Admitting: Cardiology

## 2019-12-07 NOTE — Telephone Encounter (Signed)
New Message:    Pt says she needs a letter stating that she can stop her blood thinner for 5 days. She needs this, so she can have a Steroid shot in her back.

## 2019-12-08 DIAGNOSIS — M5416 Radiculopathy, lumbar region: Secondary | ICD-10-CM | POA: Diagnosis not present

## 2019-12-08 DIAGNOSIS — M25551 Pain in right hip: Secondary | ICD-10-CM | POA: Diagnosis not present

## 2019-12-08 DIAGNOSIS — M2560 Stiffness of unspecified joint, not elsewhere classified: Secondary | ICD-10-CM | POA: Diagnosis not present

## 2019-12-08 DIAGNOSIS — M6281 Muscle weakness (generalized): Secondary | ICD-10-CM | POA: Diagnosis not present

## 2019-12-08 DIAGNOSIS — M545 Low back pain: Secondary | ICD-10-CM | POA: Diagnosis not present

## 2019-12-08 DIAGNOSIS — R2689 Other abnormalities of gait and mobility: Secondary | ICD-10-CM | POA: Diagnosis not present

## 2019-12-08 DIAGNOSIS — R293 Abnormal posture: Secondary | ICD-10-CM | POA: Diagnosis not present

## 2019-12-09 NOTE — Telephone Encounter (Signed)
That should be fine. 

## 2019-12-09 NOTE — Telephone Encounter (Signed)
Spoke to the patient just now and let her know that Dr. Harriet Masson said that will be fine. She also requests that we send the letter directly to Dr. Nelva Bush at [facility]. I told her that I would fax it over to them now. No other issues or concerns were noted.    Encouraged patient to call back with any questions or concerns.

## 2019-12-13 DIAGNOSIS — R1032 Left lower quadrant pain: Secondary | ICD-10-CM | POA: Diagnosis not present

## 2019-12-13 DIAGNOSIS — L821 Other seborrheic keratosis: Secondary | ICD-10-CM | POA: Diagnosis not present

## 2019-12-13 DIAGNOSIS — K5792 Diverticulitis of intestine, part unspecified, without perforation or abscess without bleeding: Secondary | ICD-10-CM | POA: Diagnosis not present

## 2019-12-13 DIAGNOSIS — D2239 Melanocytic nevi of other parts of face: Secondary | ICD-10-CM | POA: Diagnosis not present

## 2019-12-13 DIAGNOSIS — R350 Frequency of micturition: Secondary | ICD-10-CM | POA: Diagnosis not present

## 2019-12-29 DIAGNOSIS — M6281 Muscle weakness (generalized): Secondary | ICD-10-CM | POA: Diagnosis not present

## 2019-12-29 DIAGNOSIS — M545 Low back pain: Secondary | ICD-10-CM | POA: Diagnosis not present

## 2019-12-29 DIAGNOSIS — M25551 Pain in right hip: Secondary | ICD-10-CM | POA: Diagnosis not present

## 2019-12-29 DIAGNOSIS — M5416 Radiculopathy, lumbar region: Secondary | ICD-10-CM | POA: Diagnosis not present

## 2019-12-29 DIAGNOSIS — R2689 Other abnormalities of gait and mobility: Secondary | ICD-10-CM | POA: Diagnosis not present

## 2019-12-29 DIAGNOSIS — R293 Abnormal posture: Secondary | ICD-10-CM | POA: Diagnosis not present

## 2019-12-29 DIAGNOSIS — M2560 Stiffness of unspecified joint, not elsewhere classified: Secondary | ICD-10-CM | POA: Diagnosis not present

## 2019-12-30 DIAGNOSIS — M5136 Other intervertebral disc degeneration, lumbar region: Secondary | ICD-10-CM | POA: Diagnosis not present

## 2020-01-20 DIAGNOSIS — I2510 Atherosclerotic heart disease of native coronary artery without angina pectoris: Secondary | ICD-10-CM | POA: Diagnosis not present

## 2020-01-20 DIAGNOSIS — D6869 Other thrombophilia: Secondary | ICD-10-CM | POA: Diagnosis not present

## 2020-01-20 DIAGNOSIS — M47816 Spondylosis without myelopathy or radiculopathy, lumbar region: Secondary | ICD-10-CM | POA: Diagnosis not present

## 2020-01-20 DIAGNOSIS — M5136 Other intervertebral disc degeneration, lumbar region: Secondary | ICD-10-CM | POA: Diagnosis not present

## 2020-01-26 DIAGNOSIS — M1711 Unilateral primary osteoarthritis, right knee: Secondary | ICD-10-CM | POA: Diagnosis not present

## 2020-01-27 DIAGNOSIS — M545 Low back pain: Secondary | ICD-10-CM | POA: Diagnosis not present

## 2020-01-27 DIAGNOSIS — M5416 Radiculopathy, lumbar region: Secondary | ICD-10-CM | POA: Diagnosis not present

## 2020-02-03 DIAGNOSIS — M8949 Other hypertrophic osteoarthropathy, multiple sites: Secondary | ICD-10-CM | POA: Diagnosis not present

## 2020-02-03 DIAGNOSIS — M8589 Other specified disorders of bone density and structure, multiple sites: Secondary | ICD-10-CM | POA: Diagnosis not present

## 2020-02-03 DIAGNOSIS — K5730 Diverticulosis of large intestine without perforation or abscess without bleeding: Secondary | ICD-10-CM | POA: Diagnosis not present

## 2020-02-03 DIAGNOSIS — F439 Reaction to severe stress, unspecified: Secondary | ICD-10-CM | POA: Diagnosis not present

## 2020-02-03 DIAGNOSIS — I2510 Atherosclerotic heart disease of native coronary artery without angina pectoris: Secondary | ICD-10-CM | POA: Diagnosis not present

## 2020-02-03 DIAGNOSIS — K219 Gastro-esophageal reflux disease without esophagitis: Secondary | ICD-10-CM | POA: Diagnosis not present

## 2020-02-03 DIAGNOSIS — E782 Mixed hyperlipidemia: Secondary | ICD-10-CM | POA: Diagnosis not present

## 2020-02-03 DIAGNOSIS — Z79899 Other long term (current) drug therapy: Secondary | ICD-10-CM | POA: Diagnosis not present

## 2020-02-03 DIAGNOSIS — Z0000 Encounter for general adult medical examination without abnormal findings: Secondary | ICD-10-CM | POA: Diagnosis not present

## 2020-02-03 DIAGNOSIS — K582 Mixed irritable bowel syndrome: Secondary | ICD-10-CM | POA: Diagnosis not present

## 2020-02-03 DIAGNOSIS — R609 Edema, unspecified: Secondary | ICD-10-CM | POA: Diagnosis not present

## 2020-02-03 DIAGNOSIS — R634 Abnormal weight loss: Secondary | ICD-10-CM

## 2020-02-03 DIAGNOSIS — I10 Essential (primary) hypertension: Secondary | ICD-10-CM | POA: Diagnosis not present

## 2020-02-03 HISTORY — DX: Abnormal weight loss: R63.4

## 2020-02-04 DIAGNOSIS — K7689 Other specified diseases of liver: Secondary | ICD-10-CM | POA: Diagnosis not present

## 2020-02-04 DIAGNOSIS — I700 Atherosclerosis of aorta: Secondary | ICD-10-CM | POA: Diagnosis not present

## 2020-02-04 DIAGNOSIS — R634 Abnormal weight loss: Secondary | ICD-10-CM | POA: Diagnosis not present

## 2020-02-04 DIAGNOSIS — N281 Cyst of kidney, acquired: Secondary | ICD-10-CM | POA: Diagnosis not present

## 2020-02-04 DIAGNOSIS — K5730 Diverticulosis of large intestine without perforation or abscess without bleeding: Secondary | ICD-10-CM | POA: Diagnosis not present

## 2020-02-09 DIAGNOSIS — H524 Presbyopia: Secondary | ICD-10-CM | POA: Diagnosis not present

## 2020-02-13 DIAGNOSIS — S40011A Contusion of right shoulder, initial encounter: Secondary | ICD-10-CM | POA: Diagnosis not present

## 2020-02-13 DIAGNOSIS — M545 Low back pain: Secondary | ICD-10-CM | POA: Diagnosis not present

## 2020-02-13 DIAGNOSIS — S199XXA Unspecified injury of neck, initial encounter: Secondary | ICD-10-CM | POA: Diagnosis not present

## 2020-02-13 DIAGNOSIS — S0511XA Contusion of eyeball and orbital tissues, right eye, initial encounter: Secondary | ICD-10-CM | POA: Diagnosis not present

## 2020-02-13 DIAGNOSIS — R519 Headache, unspecified: Secondary | ICD-10-CM | POA: Diagnosis not present

## 2020-02-13 DIAGNOSIS — S0990XA Unspecified injury of head, initial encounter: Secondary | ICD-10-CM | POA: Diagnosis not present

## 2020-02-13 DIAGNOSIS — S8001XA Contusion of right knee, initial encounter: Secondary | ICD-10-CM | POA: Diagnosis not present

## 2020-02-13 DIAGNOSIS — S4991XA Unspecified injury of right shoulder and upper arm, initial encounter: Secondary | ICD-10-CM | POA: Diagnosis not present

## 2020-02-13 DIAGNOSIS — S8000XA Contusion of unspecified knee, initial encounter: Secondary | ICD-10-CM | POA: Diagnosis not present

## 2020-02-13 DIAGNOSIS — S8002XA Contusion of left knee, initial encounter: Secondary | ICD-10-CM | POA: Diagnosis not present

## 2020-02-18 DIAGNOSIS — Z79899 Other long term (current) drug therapy: Secondary | ICD-10-CM | POA: Diagnosis not present

## 2020-02-18 DIAGNOSIS — Z0000 Encounter for general adult medical examination without abnormal findings: Secondary | ICD-10-CM | POA: Diagnosis not present

## 2020-02-18 DIAGNOSIS — R609 Edema, unspecified: Secondary | ICD-10-CM | POA: Diagnosis not present

## 2020-02-24 DIAGNOSIS — M47816 Spondylosis without myelopathy or radiculopathy, lumbar region: Secondary | ICD-10-CM | POA: Diagnosis not present

## 2020-03-02 DIAGNOSIS — R293 Abnormal posture: Secondary | ICD-10-CM | POA: Diagnosis not present

## 2020-03-02 DIAGNOSIS — M2560 Stiffness of unspecified joint, not elsewhere classified: Secondary | ICD-10-CM | POA: Diagnosis not present

## 2020-03-02 DIAGNOSIS — M545 Low back pain: Secondary | ICD-10-CM | POA: Diagnosis not present

## 2020-03-02 DIAGNOSIS — M5416 Radiculopathy, lumbar region: Secondary | ICD-10-CM | POA: Diagnosis not present

## 2020-03-02 DIAGNOSIS — R2689 Other abnormalities of gait and mobility: Secondary | ICD-10-CM | POA: Diagnosis not present

## 2020-03-02 DIAGNOSIS — M6281 Muscle weakness (generalized): Secondary | ICD-10-CM | POA: Diagnosis not present

## 2020-03-02 DIAGNOSIS — M25551 Pain in right hip: Secondary | ICD-10-CM | POA: Diagnosis not present

## 2020-03-06 DIAGNOSIS — M1712 Unilateral primary osteoarthritis, left knee: Secondary | ICD-10-CM | POA: Diagnosis not present

## 2020-03-10 DIAGNOSIS — Z984 Cataract extraction status: Secondary | ICD-10-CM | POA: Diagnosis not present

## 2020-03-10 DIAGNOSIS — Z9849 Cataract extraction status, unspecified eye: Secondary | ICD-10-CM | POA: Diagnosis not present

## 2020-03-15 DIAGNOSIS — M47816 Spondylosis without myelopathy or radiculopathy, lumbar region: Secondary | ICD-10-CM | POA: Diagnosis not present

## 2020-03-15 DIAGNOSIS — M5136 Other intervertebral disc degeneration, lumbar region: Secondary | ICD-10-CM | POA: Diagnosis not present

## 2020-03-20 DIAGNOSIS — M1712 Unilateral primary osteoarthritis, left knee: Secondary | ICD-10-CM | POA: Diagnosis not present

## 2020-04-05 DIAGNOSIS — M1712 Unilateral primary osteoarthritis, left knee: Secondary | ICD-10-CM | POA: Diagnosis not present

## 2020-04-12 DIAGNOSIS — M1712 Unilateral primary osteoarthritis, left knee: Secondary | ICD-10-CM | POA: Diagnosis not present

## 2020-04-25 IMAGING — CT CT CORONARY FRACTIONAL FLOW RESERVE
4 of 7 series · 8 of 20 positions shown, 9 images · non-contrast
Comparison: none

EXAM:
FF/RCT ANALYSIS

[Series 7: best diast 81 % · axial · 0.32mm/px · z∈[+1137,+1174]mm · 2 of 276 slices shown, 3 images]
[im 92/276  vessel]
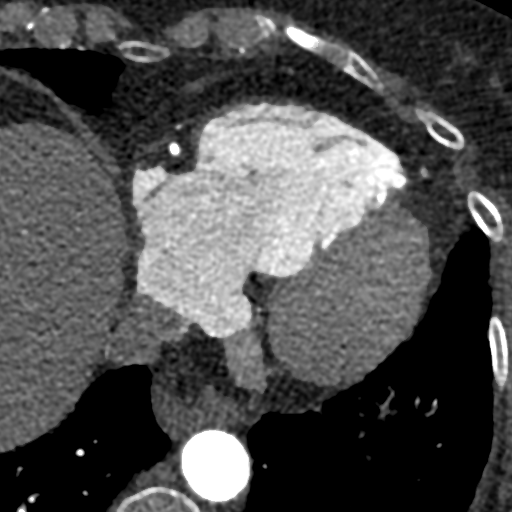
[im 92/276  lung]
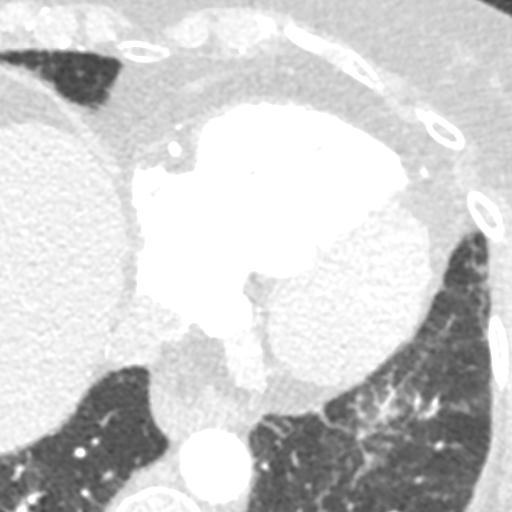
[im 184/276  vessel]
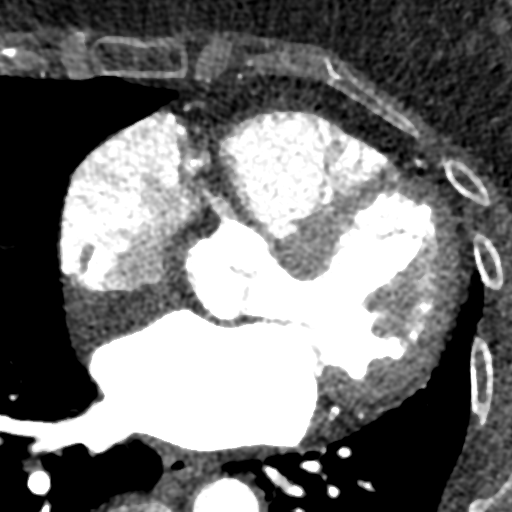

[Series 8: best syst 30 % · axial · 0.32mm/px · z∈[+1137,+1174]mm · 2 of 276 slices shown]
[im 92/276  vessel]
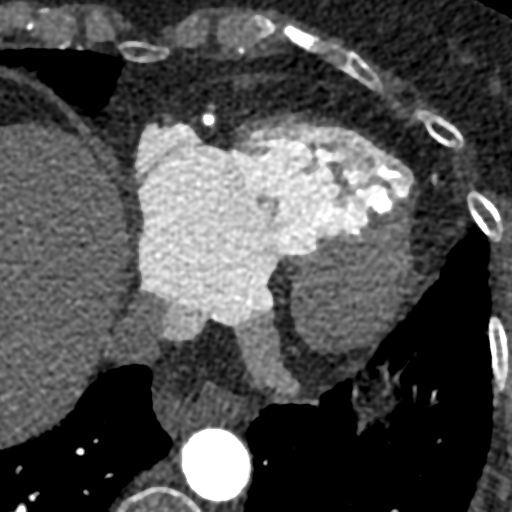
[im 184/276  vessel]
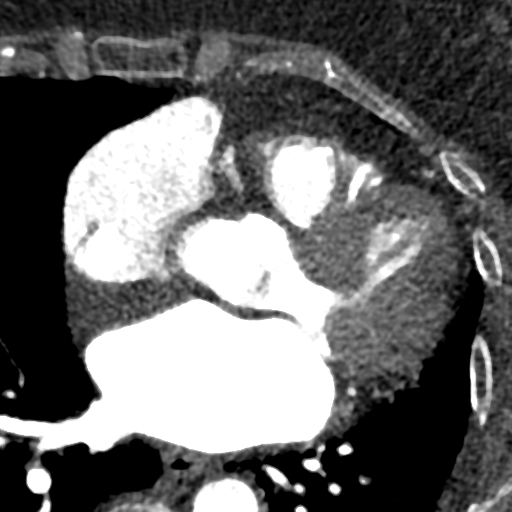

[Series 9: ts diast sharp 81 % · axial · 0.32mm/px · z∈[+1137,+1174]mm · 2 of 276 slices shown]
[im 92/276  lung]
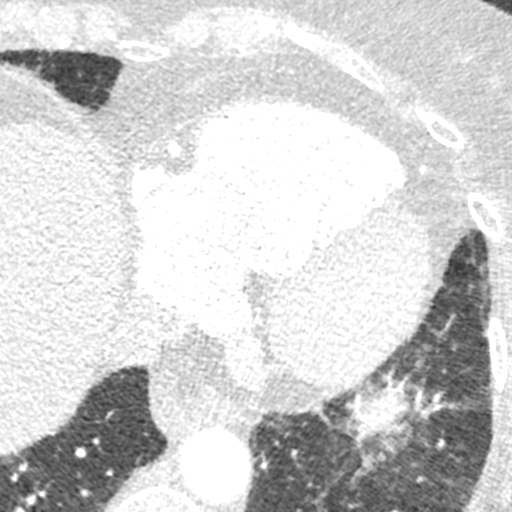
[im 184/276  lung]
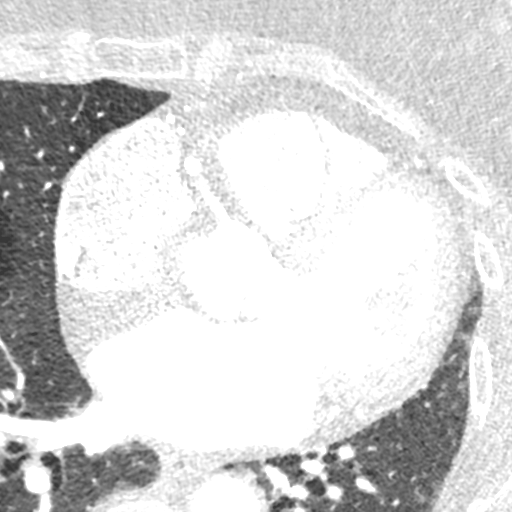

[Series 10: ts syst sharp 30 % · axial · 0.32mm/px · z∈[+1137,+1174]mm · 2 of 276 slices shown]
[im 92/276  lung]
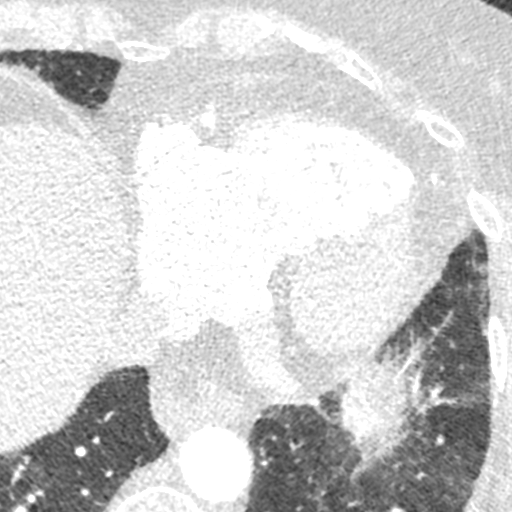
[im 184/276  lung]
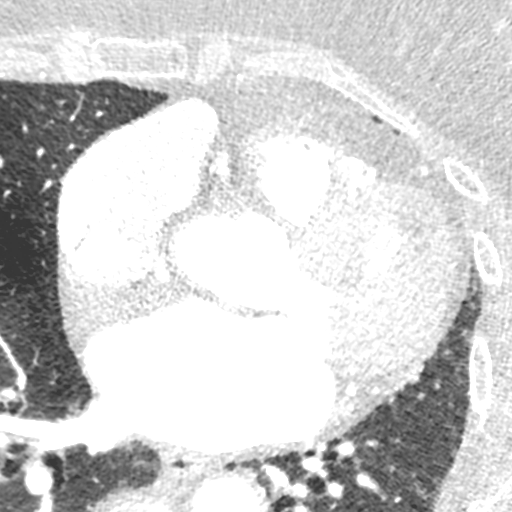

[8 of 20 positions shown; findings below may reference images not displayed]

FINDINGS: FFRct analysis was performed on the original cardiac CT angiogram
dataset. Diagrammatic representation of the FFRct analysis is
provided in a separate PDF document in PACS. This dictation was
created using the PDF document and an interactive 3D model of the
results. 3D model is not available in the EMR/PACS. Normal FFR range
is >0.80.

1. Left Main:  No significant stenosis.

2. LAD: Proximal LAD: 0.96, mid LAD 0.73.
3. LCX: No significant stenosis.
4. RCA: No significant stenosis.
IMPRESSION: 1. CT FFR analysis showed severe stenosis in the mid LAD. A cardiac
catheterization is recommended.

## 2020-05-09 DIAGNOSIS — Z23 Encounter for immunization: Secondary | ICD-10-CM | POA: Diagnosis not present

## 2020-05-24 DIAGNOSIS — E785 Hyperlipidemia, unspecified: Secondary | ICD-10-CM | POA: Insufficient documentation

## 2020-05-24 DIAGNOSIS — S0300XA Dislocation of jaw, unspecified side, initial encounter: Secondary | ICD-10-CM | POA: Insufficient documentation

## 2020-05-24 DIAGNOSIS — M1990 Unspecified osteoarthritis, unspecified site: Secondary | ICD-10-CM | POA: Insufficient documentation

## 2020-05-24 DIAGNOSIS — K589 Irritable bowel syndrome without diarrhea: Secondary | ICD-10-CM | POA: Insufficient documentation

## 2020-05-24 DIAGNOSIS — I10 Essential (primary) hypertension: Secondary | ICD-10-CM | POA: Insufficient documentation

## 2020-05-24 DIAGNOSIS — M545 Low back pain, unspecified: Secondary | ICD-10-CM | POA: Insufficient documentation

## 2020-05-24 DIAGNOSIS — Z8489 Family history of other specified conditions: Secondary | ICD-10-CM | POA: Insufficient documentation

## 2020-05-24 DIAGNOSIS — I2510 Atherosclerotic heart disease of native coronary artery without angina pectoris: Secondary | ICD-10-CM | POA: Insufficient documentation

## 2020-05-24 DIAGNOSIS — K5790 Diverticulosis of intestine, part unspecified, without perforation or abscess without bleeding: Secondary | ICD-10-CM | POA: Insufficient documentation

## 2020-05-28 NOTE — Progress Notes (Signed)
Cardiology Office Note:    Date:  05/29/2020   ID:  Pamela Dominguez, DOB 29-Jan-1933, MRN [id number]  PCP:  [name]., MD  Cardiologist:  [name], MD    Referring MD: [name]., MD    ASSESSMENT:    1. Bilateral lower extremity edema   2. Coronary artery disease of native artery of native heart with stable angina pectoris ([place])   3. Essential hypertension   4. Hyperlipidemia LDL goal <70    PLAN:    In order of problems listed above:  1. Improved off calcium channel blocker Home blood pressures in range continue her current antihypertensives including spironolactone and valsartan. 2. Stable CAD after PCI [place] Heart Association class I having no angina on current treatment continue clopidogrel and her high intensity statin.  At this time I do not think she requires an ischemia evaluation.  I have asked her to stop aspirin. 3. Home BPs at target continue current treatment MRA and ARB 4. Ideal lipids continue high intensity statin with CAD   Next appointment: 6 months   Medication Adjustments/Labs and Tests Ordered: Current medicines are reviewed at length with the patient today.  Concerns regarding medicines are outlined above.  No orders of the defined types were placed in this encounter.  No orders of the defined types were placed in this encounter.   Chief Complaint  Patient presents with  . Follow-up  . Coronary Artery Disease    History of Present Illness:    Pamela Dominguez is a 86 y.o. female with a hx of CAD with PCI and drug-eluting stent 04/08/2018 left anterior descending coronary artery, hypertension hyperlipidemia last seen 11/29/2019 with lower extremity edema due to calcium channel blocker.. Compliance with diet, lifestyle and medications: Yes  Overall doing better no further falls.  She has had no angina edema palpitation or syncope.  Her peripheral edema had resolved and home blood pressures recently have run at target less than 1 40-9 40  systolic.  She takes dual antiplatelet therapy working to withdraw aspirin.  Tolerates her statin without muscle pain or weakness.  Recent labs performed PCP office 02/18/2020 cholesterol 136 LDL at target 49 triglycerides 100 HDL 70 sodium mildly diminished 131 potassium 4.3 creatinine 0.61 GFR 84 cc and normal liver function test  Since seen [facility] ED 02/13/2020 after fall at home.  X-ray of the shoulder normal CT of the cervical spine showed no acute abnormality CT of the head showed chronic microvascular disease lumbar spine x-ray unremarkable and she was discharged home.  No laboratory test were performed. Past Medical History:  Diagnosis Date  . Abnormal cardiac CT angiography 04/04/2018  . Abnormal CXR 01/21/2018  . Angina, class III ([place]) 04/04/2018  . Arthritis    "all over" (04/08/2018)  . CAD S/P percutaneous coronary angioplasty and stent DES mLAD beyond 2nd diag 04/08/2018  . Chest pain 01/22/2018  . Chronic edema 05/19/2017  . Chronic lower back pain   . Complication of anesthesia 1989   mouth joint trouble after hysterctomy, told by dentist to haver anesthesia use bite block to keep mouth open same amount all over  . Coronary artery disease   . Coronary artery disease of native artery of native heart with stable angina pectoris ([place])   . Diverticulosis   . Diverticulosis of colon 09/28/2013  . Diverticulosis of colon (without mention of hemorrhage) 09/28/2013  . DJD (degenerative joint disease)   . Essential hypertension 09/18/2015   Last  Assessment & Plan:  Relevant Hx: Course: Daily Update: Today's Plan:this is stable for her at this time and will follow and on repeat she was much better overall Electronically signed by: Mayer Camel, NP 09/18/15 1146  . Family history of anesthesia complication    brother low bp, heart problems, ileus  . GERD (gastroesophageal reflux disease)   . High risk medication use 09/18/2015  . History of diverticulosis 08/28/2017  .  Hyperlipidemia   . Hyperlipidemia LDL goal <70 09/18/2015   Last Assessment & Plan:  Update her lipids for her fasting  . Hyperlipidemia, unspecified 09/18/2015   Last Assessment & Plan:  Update her lipids for her fasting  . Hypertension   . IBS (irritable bowel syndrome)   . Irritable bowel syndrome 09/28/2013  . Irritable bowel syndrome with both constipation and diarrhea 09/18/2015   Last Assessment & Plan:  She has more fecal incontinence and she has to wear a pad and that is part of her UTI issues, though the Lucrezia Starch has helped her about 75% to diminish this and for that she is pleased.  . OA (osteoarthritis) of knee 09/27/2013  . Osteopenia   . Other and unspecified hyperlipidemia 09/28/2013  . Pneumonia 1955  . Primary osteoarthritis involving multiple joints 09/18/2015   Last Assessment & Plan:  This is stable for her and will follow along  . SOB (shortness of breath) 02/11/2018  . Squamous carcinoma ~ 2009   "right ear"  . TMJ (dislocation of temporomandibular joint)   . Unspecified essential hypertension 09/28/2013    Past Surgical History:  Procedure Laterality Date  . ABDOMINAL HYSTERECTOMY  1989  . APPENDECTOMY  1948  . BROW LIFT AND BLEPHAROPLASTY    . CHOLECYSTECTOMY  1999  . COLONOSCOPY  2013  . CORONARY ANGIOPLASTY WITH STENT PLACEMENT  04/08/2018  . CORONARY STENT INTERVENTION N/A 04/08/2018   Procedure: CORONARY STENT INTERVENTION;  Surgeon: Leonie Man, MD;  Location: [facility];  Service: Cardiovascular;  Laterality: N/A;  . JOINT REPLACEMENT    . KNEE ARTHROSCOPY Left 1996  . LEFT HEART CATH AND CORONARY ANGIOGRAPHY N/A 04/08/2018   Procedure: LEFT HEART CATH AND CORONARY ANGIOGRAPHY;  Surgeon: Leonie Man, MD;  Location: [facility];  Service: Cardiovascular;  Laterality: N/A;  . NASAL SINUS SURGERY  1968  . SQUAMOUS CELL CARCINOMA EXCISION Right    "ear"  . TONSILLECTOMY AND ADENOIDECTOMY  1942  . TOTAL KNEE ARTHROPLASTY Right 09/27/2013    Procedure: RIGHT TOTAL KNEE ARTHROPLASTY;  Surgeon: Gearlean Alf, MD;  Location: [facility];  Service: Orthopedics;  Laterality: Right;    Current Medications: Current Meds  Medication Sig  . acetaminophen (TYLENOL) 650 MG CR tablet Take 650 mg by mouth daily as needed for pain.  Marland Kitchen aspirin EC 81 MG tablet Take 81 mg by mouth daily.  Marland Kitchen atorvastatin (LIPITOR) 40 MG tablet Take 1 tablet (40 mg total) by mouth daily at 6 PM.  . b complex vitamins tablet Take 1 tablet by mouth daily.  . Biotin 10000 MCG TABS Take 10,000 mcg by mouth daily.  . cetirizine (ZYRTEC) 10 MG tablet Take 10 mg by mouth daily.  . cholestyramine (QUESTRAN) 4 G packet Take 4 g by mouth at bedtime.  . clopidogrel (PLAVIX) 75 MG tablet Take 1 tablet (75 mg total) by mouth daily with breakfast.  . famotidine (PEPCID) 40 MG tablet Take 40 mg by mouth daily.  . Melatonin 5 MG CAPS Take 5  mg by mouth at bedtime as needed (sleep).  . Multiple Vitamin (MULTIVITAMIN) tablet Take 1 tablet by mouth daily.  . Multiple Vitamins-Minerals (OCUVITE PO) Take 1 tablet by mouth daily.  . nitroGLYCERIN (NITROSTAT) 0.4 MG SL tablet Place 1 tablet (0.4 mg total) under the tongue every 5 (five) minutes as needed for chest pain.  . pantoprazole (PROTONIX) 40 MG tablet Take 1 tablet (40 mg total) by mouth daily.  Vladimir Faster Glycol-Propyl Glycol (SYSTANE OP) Place 1 drop into both eyes daily as needed (dry eyes).  Marland Kitchen spironolactone (ALDACTONE) 25 MG tablet Take 25 mg by mouth 2 (two) times daily.   . valsartan (DIOVAN) 320 MG tablet Take 1 tablet (320 mg total) by mouth daily.     Allergies:   Actonel [risedronate sodium], Alendronate, Nizatidine, Penicillins, and Quinine   Social History   Socioeconomic History  . Marital status: Married    Spouse name: Not on file  . Number of children: Not on file  . Years of education: Not on file  . Highest education level: Not on file  Occupational History  . Not on file  Tobacco Use  . Smoking  status: Never Smoker  . Smokeless tobacco: Never Used  Vaping Use  . Vaping Use: Never used  Substance and Sexual Activity  . Alcohol use: Never  . Drug use: Never  . Sexual activity: Yes  Other Topics Concern  . Not on file  Social History Narrative  . Not on file   Social Determinants of Health   Financial Resource Strain:   . Difficulty of Paying Living Expenses: Not on file  Food Insecurity:   . Worried About Charity fundraiser in the Last Year: Not on file  . Ran Out of Food in the Last Year: Not on file  Transportation Needs:   . Lack of Transportation (Medical): Not on file  . Lack of Transportation (Non-Medical): Not on file  Physical Activity:   . Days of Exercise per Week: Not on file  . Minutes of Exercise per Session: Not on file  Stress:   . Feeling of Stress : Not on file  Social Connections:   . Frequency of Communication with Friends and Family: Not on file  . Frequency of Social Gatherings with Friends and Family: Not on file  . Attends Religious Services: Not on file  . Active Member of Clubs or Organizations: Not on file  . Attends Archivist Meetings: Not on file  . Marital Status: Not on file     Family History: The patient's family history includes Atrial fibrillation in her brother; Cancer in her father; Hypertension in her mother. ROS:   Please see the history of present illness.    All other systems reviewed and are negative.  EKGs/Labs/Other Studies Reviewed:    The following studies were reviewed today:   Recent Labs: 07/01/2019: ALT 25; BUN 14; Creatinine, Ser 0.67; Potassium 4.5; Sodium 137  Recent Lipid Panel    Component Value Date/Time   CHOL 138 07/01/2019 0957   TRIG 100 07/01/2019 0957   HDL 75 07/01/2019 0957   CHOLHDL 1.8 07/01/2019 0957   LDLCALC 45 07/01/2019 0957    Physical Exam:    VS:  BP (!) 144/84   Pulse 74   Ht 5\' 1"  (1.549 m)   Wt 144 lb 9.6 oz (65.6 kg)   SpO2 97%   BMI 27.32 kg/m     Wt  Readings from Last 3 Encounters:  05/29/20 144 lb 9.6 oz (65.6 kg)  11/29/19 144 lb 12.8 oz (65.7 kg)  07/01/19 144 lb (65.3 kg)     GEN:  Well nourished, well developed in no acute distress HEENT: Normal NECK: No JVD; No carotid bruits LYMPHATICS: No lymphadenopathy CARDIAC: RRR, no murmurs, rubs, gallops RESPIRATORY:  Clear to auscultation without rales, wheezing or rhonchi  ABDOMEN: Soft, non-tender, non-distended MUSCULOSKELETAL:  No edema; No deformity  SKIN: Warm and dry NEUROLOGIC:  Alert and oriented x 3 PSYCHIATRIC:  Normal affect    Signed, [name], MD  05/29/2020 11:06 AM    [place]

## 2020-05-29 ENCOUNTER — Ambulatory Visit: Payer: PPO | Admitting: Cardiology

## 2020-05-29 ENCOUNTER — Other Ambulatory Visit: Payer: Self-pay

## 2020-05-29 ENCOUNTER — Encounter: Payer: Self-pay | Admitting: Cardiology

## 2020-05-29 VITALS — BP 144/84 | HR 74 | Ht 61.0 in | Wt 144.6 lb

## 2020-05-29 DIAGNOSIS — R600 Localized edema: Secondary | ICD-10-CM | POA: Diagnosis not present

## 2020-05-29 DIAGNOSIS — I25118 Atherosclerotic heart disease of native coronary artery with other forms of angina pectoris: Secondary | ICD-10-CM

## 2020-05-29 DIAGNOSIS — E785 Hyperlipidemia, unspecified: Secondary | ICD-10-CM

## 2020-05-29 DIAGNOSIS — I10 Essential (primary) hypertension: Secondary | ICD-10-CM

## 2020-05-29 NOTE — Patient Instructions (Signed)
Medication Instructions:  Your physician has recommended you make the following change in your medication:  STOP: Aspirin *If you need a refill on your cardiac medications before your next appointment, please call your pharmacy*   Lab Work: None If you have labs (blood work) drawn today and your tests are completely normal, you will receive your results only by: MyChart Message (if you have MyChart) OR A paper copy in the mail If you have any lab test that is abnormal or we need to change your treatment, we will call you to review the results.   Testing/Procedures: None   Follow-Up: At [facility], you and your health needs are our priority.  As part of our continuing mission to provide you with exceptional heart care, we have created designated Provider Care Teams.  These Care Teams include your primary Cardiologist (physician) and Advanced Practice Providers (APPs -  Physician Assistants and Nurse Practitioners) who all work together to provide you with the care you need, when you need it.  We recommend signing up for the patient portal called "MyChart".  Sign up information is provided on this After Visit Summary.  MyChart is used to connect with patients for Virtual Visits (Telemedicine).  Patients are able to view lab/test results, encounter notes, upcoming appointments, etc.  Non-urgent messages can be sent to your provider as well.   To learn more about what you can do with MyChart, go to [email or web address].com.    Your next appointment:   6 month(s)  The format for your next appointment:   In Person  Provider:   Brian Munley, MD   Other Instructions   

## 2020-07-10 DIAGNOSIS — R5383 Other fatigue: Secondary | ICD-10-CM | POA: Diagnosis not present

## 2020-07-10 DIAGNOSIS — M25511 Pain in right shoulder: Secondary | ICD-10-CM | POA: Diagnosis not present

## 2020-07-10 DIAGNOSIS — I10 Essential (primary) hypertension: Secondary | ICD-10-CM | POA: Diagnosis not present

## 2020-07-10 DIAGNOSIS — R5381 Other malaise: Secondary | ICD-10-CM | POA: Diagnosis not present

## 2020-07-10 DIAGNOSIS — G8929 Other chronic pain: Secondary | ICD-10-CM | POA: Diagnosis not present

## 2020-07-10 DIAGNOSIS — M8949 Other hypertrophic osteoarthropathy, multiple sites: Secondary | ICD-10-CM | POA: Diagnosis not present

## 2020-08-08 DIAGNOSIS — R5383 Other fatigue: Secondary | ICD-10-CM | POA: Diagnosis not present

## 2020-08-08 DIAGNOSIS — Z0000 Encounter for general adult medical examination without abnormal findings: Secondary | ICD-10-CM | POA: Diagnosis not present

## 2020-08-08 DIAGNOSIS — I2510 Atherosclerotic heart disease of native coronary artery without angina pectoris: Secondary | ICD-10-CM | POA: Diagnosis not present

## 2020-08-08 DIAGNOSIS — K219 Gastro-esophageal reflux disease without esophagitis: Secondary | ICD-10-CM | POA: Diagnosis not present

## 2020-08-08 DIAGNOSIS — K5730 Diverticulosis of large intestine without perforation or abscess without bleeding: Secondary | ICD-10-CM | POA: Diagnosis not present

## 2020-08-08 DIAGNOSIS — R609 Edema, unspecified: Secondary | ICD-10-CM | POA: Diagnosis not present

## 2020-08-08 DIAGNOSIS — Z79899 Other long term (current) drug therapy: Secondary | ICD-10-CM | POA: Diagnosis not present

## 2020-08-08 DIAGNOSIS — F439 Reaction to severe stress, unspecified: Secondary | ICD-10-CM | POA: Diagnosis not present

## 2020-08-08 DIAGNOSIS — K582 Mixed irritable bowel syndrome: Secondary | ICD-10-CM | POA: Diagnosis not present

## 2020-08-08 DIAGNOSIS — I10 Essential (primary) hypertension: Secondary | ICD-10-CM | POA: Diagnosis not present

## 2020-08-08 DIAGNOSIS — R5381 Other malaise: Secondary | ICD-10-CM | POA: Diagnosis not present

## 2020-08-08 DIAGNOSIS — M8949 Other hypertrophic osteoarthropathy, multiple sites: Secondary | ICD-10-CM | POA: Diagnosis not present

## 2020-08-08 DIAGNOSIS — E782 Mixed hyperlipidemia: Secondary | ICD-10-CM | POA: Diagnosis not present

## 2020-08-09 ENCOUNTER — Other Ambulatory Visit: Payer: Self-pay | Admitting: Cardiology

## 2020-11-30 DIAGNOSIS — I10 Essential (primary) hypertension: Secondary | ICD-10-CM | POA: Insufficient documentation

## 2020-12-11 DIAGNOSIS — R350 Frequency of micturition: Secondary | ICD-10-CM | POA: Diagnosis not present

## 2020-12-11 DIAGNOSIS — K5792 Diverticulitis of intestine, part unspecified, without perforation or abscess without bleeding: Secondary | ICD-10-CM | POA: Diagnosis not present

## 2020-12-11 DIAGNOSIS — K219 Gastro-esophageal reflux disease without esophagitis: Secondary | ICD-10-CM | POA: Diagnosis not present

## 2020-12-11 DIAGNOSIS — M159 Polyosteoarthritis, unspecified: Secondary | ICD-10-CM | POA: Diagnosis not present

## 2020-12-11 DIAGNOSIS — K582 Mixed irritable bowel syndrome: Secondary | ICD-10-CM | POA: Diagnosis not present

## 2020-12-12 DIAGNOSIS — L578 Other skin changes due to chronic exposure to nonionizing radiation: Secondary | ICD-10-CM | POA: Diagnosis not present

## 2020-12-12 DIAGNOSIS — L821 Other seborrheic keratosis: Secondary | ICD-10-CM | POA: Diagnosis not present

## 2020-12-15 ENCOUNTER — Other Ambulatory Visit: Payer: Self-pay

## 2020-12-15 ENCOUNTER — Encounter: Payer: Self-pay | Admitting: Cardiology

## 2020-12-15 ENCOUNTER — Ambulatory Visit: Payer: PPO | Admitting: Cardiology

## 2020-12-15 VITALS — BP 140/74 | HR 101 | Ht 61.0 in | Wt 142.0 lb

## 2020-12-15 DIAGNOSIS — I25118 Atherosclerotic heart disease of native coronary artery with other forms of angina pectoris: Secondary | ICD-10-CM

## 2020-12-15 DIAGNOSIS — I10 Essential (primary) hypertension: Secondary | ICD-10-CM

## 2020-12-15 DIAGNOSIS — R600 Localized edema: Secondary | ICD-10-CM

## 2020-12-15 DIAGNOSIS — E785 Hyperlipidemia, unspecified: Secondary | ICD-10-CM | POA: Diagnosis not present

## 2020-12-15 NOTE — Patient Instructions (Signed)

## 2020-12-15 NOTE — Progress Notes (Signed)
Cardiology Office Note:    Date:  12/15/2020   ID:  Pamela Dominguez, DOB 03/24/1933, MRN [id number]  PCP:  [name]., MD  Cardiologist:  [name], MD    Referring MD: [name]., MD    ASSESSMENT:    1. Coronary artery disease of native artery of native heart with stable angina pectoris ([place])   2. Essential hypertension   3. Hyperlipidemia LDL goal <70   4. Bilateral lower extremity edema    PLAN:    In order of problems listed above:  1. Overall doing well stable CAD having no anginal discomfort continue treatment including clopidogrel lipid-lowering high intensity statin and her calcium channel blocker.  [place] Heart Association class I at this time I would not do an ischemia evaluation 2. At target continue current treatment I asked her to trend her blood pressures at home and let me know if she is breaking 353 systolic 3. Continue her statin high intensity with CAD 4. Resolved off calcium channel blocker   Next appointment: 6 months   Medication Adjustments/Labs and Tests Ordered: Current medicines are reviewed at length with the patient today.  Concerns regarding medicines are outlined above.  No orders of the defined types were placed in this encounter.  No orders of the defined types were placed in this encounter.   Chief Complaint  Patient presents with  . Follow-up    History of Present Illness:    Pamela Dominguez is a 87 y.o. female with a hx of CAD with PCI and drug-eluting stent left anterior descending coronary artery 04/08/2018 hypertension hyperlipidemia lower extremity edema related to calcium channel blocker last seen 05/29/2020.  Compliance with diet, lifestyle and medications: Yes  She is doing well she has a great deal of stress in her life with increasing problems with caring for her husband with dementia. Despite that she has had no angina lupus skin palpitations syncope. Her blood pressure is top normal asked her to start to trend  at home and contact me if elevated. Recent labs are at target for [place] 08/08/2020: Cholesterol 132 LDL 50 triglycerides 98 HDL 70 07/10/2020: Potassium 4.0 creatinine 0.56 normal liver function test hemoglobin 14.1 platelets 25,000 Past Medical History:  Diagnosis Date  . Abnormal cardiac CT angiography 04/04/2018  . Abnormal CXR 01/21/2018  . Angina, class III ([place]) 04/04/2018  . Arthritis    "all over" (04/08/2018)  . CAD S/P percutaneous coronary angioplasty and stent DES mLAD beyond 2nd diag 04/08/2018  . Chest pain 01/22/2018  . Chronic edema 05/19/2017  . Chronic lower back pain   . Complication of anesthesia 1989   mouth joint trouble after hysterctomy, told by dentist to haver anesthesia use bite block to keep mouth open same amount all over  . Coronary artery disease   . Coronary artery disease of native artery of native heart with stable angina pectoris ([place])   . Diverticulosis   . Diverticulosis of colon 09/28/2013  . Diverticulosis of colon (without mention of hemorrhage) 09/28/2013  . DJD (degenerative joint disease)   . Essential hypertension 09/18/2015   Last Assessment & Plan:  Relevant Hx: Course: Daily Update: Today's Plan:this is stable for her at this time and will follow and on repeat she was much better overall Electronically signed by: Mayer Camel, NP 09/18/15 1146  . Family history of anesthesia complication    brother low bp, heart problems, ileus  .  GERD (gastroesophageal reflux disease)   . High risk medication use 09/18/2015  . History of diverticulosis 08/28/2017  . Hyperlipidemia   . Hyperlipidemia LDL goal <70 09/18/2015   Last Assessment & Plan:  Update her lipids for her fasting  . Hyperlipidemia, unspecified 09/18/2015   Last Assessment & Plan:  Update her lipids for her fasting  . Hypertension   . IBS (irritable bowel syndrome)   . Irritable bowel syndrome 09/28/2013  . Irritable bowel syndrome with  both constipation and diarrhea 09/18/2015   Last Assessment & Plan:  She has more fecal incontinence and she has to wear a pad and that is part of her UTI issues, though the Lucrezia Starch has helped her about 75% to diminish this and for that she is pleased.  . OA (osteoarthritis) of knee 09/27/2013  . Osteopenia   . Other and unspecified hyperlipidemia 09/28/2013  . Pneumonia 1955  . Primary osteoarthritis involving multiple joints 09/18/2015   Last Assessment & Plan:  This is stable for her and will follow along  . SOB (shortness of breath) 02/11/2018  . Squamous carcinoma ~ 2009   "right ear"  . TMJ (dislocation of temporomandibular joint)   . Unspecified essential hypertension 09/28/2013    Past Surgical History:  Procedure Laterality Date  . ABDOMINAL HYSTERECTOMY  1989  . APPENDECTOMY  1948  . BROW LIFT AND BLEPHAROPLASTY    . CHOLECYSTECTOMY  1999  . COLONOSCOPY  2013  . CORONARY ANGIOPLASTY WITH STENT PLACEMENT  04/08/2018  . CORONARY STENT INTERVENTION N/A 04/08/2018   Procedure: CORONARY STENT INTERVENTION;  Surgeon: Leonie Man, MD;  Location: [place] [facility];  Service: Cardiovascular;  Laterality: N/A;  . JOINT REPLACEMENT    . KNEE ARTHROSCOPY Left 1996  . LEFT HEART CATH AND CORONARY ANGIOGRAPHY N/A 04/08/2018   Procedure: LEFT HEART CATH AND CORONARY ANGIOGRAPHY;  Surgeon: Leonie Man, MD;  Location: [place] [facility];  Service: Cardiovascular;  Laterality: N/A;  . NASAL SINUS SURGERY  1968  . SQUAMOUS CELL CARCINOMA EXCISION Right    "ear"  . TONSILLECTOMY AND ADENOIDECTOMY  1942  . TOTAL KNEE ARTHROPLASTY Right 09/27/2013   Procedure: RIGHT TOTAL KNEE ARTHROPLASTY;  Surgeon: Gearlean Alf, MD;  Location: [facility];  Service: Orthopedics;  Laterality: Right;    Current Medications: Current Meds  Medication Sig  . acetaminophen (TYLENOL) 650 MG CR tablet Take 650 mg by mouth daily as needed for pain.  Marland Kitchen atorvastatin (LIPITOR) 40 MG tablet Take 1 tablet by mouth  daily at 6 pm  . b complex vitamins tablet Take 1 tablet by mouth daily.  . Biotin 10000 MCG TABS Take 10,000 mcg by mouth daily.  . cetirizine (ZYRTEC) 10 MG tablet Take 10 mg by mouth daily.  . cholestyramine (QUESTRAN) 4 G packet Take 4 g by mouth at bedtime.  . ciprofloxacin (CIPRO) 500 MG tablet Take 500 mg by mouth 2 (two) times daily.  . clopidogrel (PLAVIX) 75 MG tablet Take 1 tablet (75 mg total) by mouth daily with breakfast.  . famotidine (PEPCID) 40 MG tablet Take 40 mg by mouth daily.  . Melatonin 5 MG CAPS Take 5 mg by mouth at bedtime as needed (sleep).  . metroNIDAZOLE (FLAGYL) 500 MG tablet Take 500 mg by mouth 3 (three) times daily.  . Multiple Vitamin (MULTIVITAMIN) tablet Take 1 tablet by mouth daily.  . Multiple Vitamins-Minerals (OCUVITE PO) Take 1 tablet by mouth daily.  . nitroGLYCERIN (NITROSTAT) 0.4 MG SL tablet  Place 1 tablet (0.4 mg total) under the tongue every 5 (five) minutes as needed for chest pain.  . pantoprazole (PROTONIX) 40 MG tablet Take 1 tablet (40 mg total) by mouth daily.  Vladimir Faster Glycol-Propyl Glycol (SYSTANE OP) Place 1 drop into both eyes daily as needed (dry eyes).  Marland Kitchen spironolactone (ALDACTONE) 25 MG tablet Take 25 mg by mouth 2 (two) times daily.   . valsartan (DIOVAN) 320 MG tablet Take 1 tablet (320 mg total) by mouth daily.     Allergies:   Actonel [risedronate sodium], Alendronate, Nizatidine, Penicillins, and Quinine   Social History   Socioeconomic History  . Marital status: Married    Spouse name: Not on file  . Number of children: Not on file  . Years of education: Not on file  . Highest education level: Not on file  Occupational History  . Not on file  Tobacco Use  . Smoking status: Never Smoker  . Smokeless tobacco: Never Used  Vaping Use  . Vaping Use: Never used  Substance and Sexual Activity  . Alcohol use: Never  . Drug use: Never  . Sexual activity: Yes  Other Topics Concern  . Not on file  Social History  Narrative  . Not on file   Social Determinants of Health   Financial Resource Strain: Not on file  Food Insecurity: Not on file  Transportation Needs: Not on file  Physical Activity: Not on file  Stress: Not on file  Social Connections: Not on file     Family History: The patient's family history includes Atrial fibrillation in her brother; Cancer in her father; Hypertension in her mother. ROS:   Please see the history of present illness.    All other systems reviewed and are negative.  EKGs/Labs/Other Studies Reviewed:    The following studies were reviewed today: EKG today sinus rhythm normal Recent Lipid Panel    Component Value Date/Time   CHOL 138 07/01/2019 0957   TRIG 100 07/01/2019 0957   HDL 75 07/01/2019 0957   CHOLHDL 1.8 07/01/2019 0957   LDLCALC 45 07/01/2019 0957    Physical Exam:    VS:  BP 140/74 (BP Location: Right Arm, Patient Position: Sitting, Cuff Size: Normal)   Pulse (!) 101   Ht 5\' 1"  (1.549 m)   Wt 142 lb (64.4 kg)   SpO2 97%   BMI 26.83 kg/m     Wt Readings from Last 3 Encounters:  12/15/20 142 lb (64.4 kg)  05/29/20 144 lb 9.6 oz (65.6 kg)  11/29/19 144 lb 12.8 oz (65.7 kg)     GEN: Looks younger than her age well nourished, well developed in no acute distress HEENT: Normal NECK: No JVD; No carotid bruits LYMPHATICS: No lymphadenopathy CARDIAC: RRR, no murmurs, rubs, gallops RESPIRATORY:  Clear to auscultation without rales, wheezing or rhonchi  ABDOMEN: Soft, non-tender, non-distended MUSCULOSKELETAL:  No edema; No deformity  SKIN: Warm and dry NEUROLOGIC:  Alert and oriented x 3 PSYCHIATRIC:  Normal affect    Signed, [name], MD  12/15/2020 3:48 PM    [facility]

## 2021-01-02 DIAGNOSIS — M1712 Unilateral primary osteoarthritis, left knee: Secondary | ICD-10-CM | POA: Diagnosis not present

## 2021-01-17 DIAGNOSIS — M1712 Unilateral primary osteoarthritis, left knee: Secondary | ICD-10-CM | POA: Diagnosis not present

## 2021-02-15 DIAGNOSIS — R609 Edema, unspecified: Secondary | ICD-10-CM | POA: Diagnosis not present

## 2021-02-15 DIAGNOSIS — K5730 Diverticulosis of large intestine without perforation or abscess without bleeding: Secondary | ICD-10-CM | POA: Diagnosis not present

## 2021-02-15 DIAGNOSIS — K219 Gastro-esophageal reflux disease without esophagitis: Secondary | ICD-10-CM | POA: Diagnosis not present

## 2021-02-15 DIAGNOSIS — Z7902 Long term (current) use of antithrombotics/antiplatelets: Secondary | ICD-10-CM | POA: Diagnosis not present

## 2021-02-15 DIAGNOSIS — K582 Mixed irritable bowel syndrome: Secondary | ICD-10-CM | POA: Diagnosis not present

## 2021-02-15 DIAGNOSIS — R5383 Other fatigue: Secondary | ICD-10-CM | POA: Diagnosis not present

## 2021-02-15 DIAGNOSIS — M159 Polyosteoarthritis, unspecified: Secondary | ICD-10-CM | POA: Diagnosis not present

## 2021-02-15 DIAGNOSIS — Z0000 Encounter for general adult medical examination without abnormal findings: Secondary | ICD-10-CM | POA: Diagnosis not present

## 2021-02-15 DIAGNOSIS — R5381 Other malaise: Secondary | ICD-10-CM | POA: Diagnosis not present

## 2021-02-15 DIAGNOSIS — M8589 Other specified disorders of bone density and structure, multiple sites: Secondary | ICD-10-CM | POA: Diagnosis not present

## 2021-02-15 DIAGNOSIS — I2510 Atherosclerotic heart disease of native coronary artery without angina pectoris: Secondary | ICD-10-CM | POA: Diagnosis not present

## 2021-02-15 DIAGNOSIS — Z79899 Other long term (current) drug therapy: Secondary | ICD-10-CM | POA: Diagnosis not present

## 2021-02-15 DIAGNOSIS — E782 Mixed hyperlipidemia: Secondary | ICD-10-CM | POA: Diagnosis not present

## 2021-02-15 DIAGNOSIS — I10 Essential (primary) hypertension: Secondary | ICD-10-CM | POA: Diagnosis not present

## 2021-02-15 DIAGNOSIS — R7303 Prediabetes: Secondary | ICD-10-CM | POA: Diagnosis not present

## 2021-02-19 ENCOUNTER — Other Ambulatory Visit: Payer: Self-pay | Admitting: Cardiology

## 2021-02-19 NOTE — Telephone Encounter (Signed)
Pharmacy notified refill to soon.  

## 2021-02-21 DIAGNOSIS — H524 Presbyopia: Secondary | ICD-10-CM | POA: Diagnosis not present

## 2021-02-27 ENCOUNTER — Other Ambulatory Visit: Payer: Self-pay

## 2021-02-27 MED ORDER — ATORVASTATIN CALCIUM 40 MG PO TABS: ORAL_TABLET | ORAL | 3 refills | Status: DC

## 2021-02-27 NOTE — Telephone Encounter (Signed)
Atorvastatin 40 mg # 90 x 3 refills sent to [facility]

## 2021-04-11 DIAGNOSIS — H903 Sensorineural hearing loss, bilateral: Secondary | ICD-10-CM | POA: Diagnosis not present

## 2021-04-28 DIAGNOSIS — K5732 Diverticulitis of large intestine without perforation or abscess without bleeding: Secondary | ICD-10-CM | POA: Diagnosis not present

## 2021-05-08 DIAGNOSIS — Z23 Encounter for immunization: Secondary | ICD-10-CM | POA: Diagnosis not present

## 2021-05-15 DIAGNOSIS — R2689 Other abnormalities of gait and mobility: Secondary | ICD-10-CM | POA: Diagnosis not present

## 2021-05-15 DIAGNOSIS — M5459 Other low back pain: Secondary | ICD-10-CM | POA: Diagnosis not present

## 2021-05-15 DIAGNOSIS — M5416 Radiculopathy, lumbar region: Secondary | ICD-10-CM | POA: Diagnosis not present

## 2021-05-22 DIAGNOSIS — H9190 Unspecified hearing loss, unspecified ear: Secondary | ICD-10-CM | POA: Diagnosis not present

## 2021-05-22 DIAGNOSIS — H61303 Acquired stenosis of external ear canal, unspecified, bilateral: Secondary | ICD-10-CM | POA: Diagnosis not present

## 2021-05-22 DIAGNOSIS — H6121 Impacted cerumen, right ear: Secondary | ICD-10-CM | POA: Diagnosis not present

## 2021-05-23 DIAGNOSIS — R208 Other disturbances of skin sensation: Secondary | ICD-10-CM | POA: Diagnosis not present

## 2021-05-23 DIAGNOSIS — R2681 Unsteadiness on feet: Secondary | ICD-10-CM | POA: Diagnosis not present

## 2021-05-23 DIAGNOSIS — M2560 Stiffness of unspecified joint, not elsewhere classified: Secondary | ICD-10-CM | POA: Diagnosis not present

## 2021-05-23 DIAGNOSIS — M25662 Stiffness of left knee, not elsewhere classified: Secondary | ICD-10-CM | POA: Diagnosis not present

## 2021-05-23 DIAGNOSIS — R293 Abnormal posture: Secondary | ICD-10-CM | POA: Diagnosis not present

## 2021-05-23 DIAGNOSIS — R2689 Other abnormalities of gait and mobility: Secondary | ICD-10-CM | POA: Diagnosis not present

## 2021-05-23 DIAGNOSIS — M6281 Muscle weakness (generalized): Secondary | ICD-10-CM | POA: Diagnosis not present

## 2021-05-23 DIAGNOSIS — M79605 Pain in left leg: Secondary | ICD-10-CM | POA: Diagnosis not present

## 2021-05-23 DIAGNOSIS — M25661 Stiffness of right knee, not elsewhere classified: Secondary | ICD-10-CM | POA: Diagnosis not present

## 2021-05-23 DIAGNOSIS — M5459 Other low back pain: Secondary | ICD-10-CM | POA: Diagnosis not present

## 2021-05-25 DIAGNOSIS — M4316 Spondylolisthesis, lumbar region: Secondary | ICD-10-CM | POA: Diagnosis not present

## 2021-05-25 DIAGNOSIS — M48061 Spinal stenosis, lumbar region without neurogenic claudication: Secondary | ICD-10-CM | POA: Diagnosis not present

## 2021-05-25 DIAGNOSIS — M4807 Spinal stenosis, lumbosacral region: Secondary | ICD-10-CM | POA: Diagnosis not present

## 2021-05-25 DIAGNOSIS — M4726 Other spondylosis with radiculopathy, lumbar region: Secondary | ICD-10-CM | POA: Diagnosis not present

## 2021-05-25 DIAGNOSIS — M419 Scoliosis, unspecified: Secondary | ICD-10-CM | POA: Diagnosis not present

## 2021-05-29 DIAGNOSIS — M2560 Stiffness of unspecified joint, not elsewhere classified: Secondary | ICD-10-CM | POA: Diagnosis not present

## 2021-05-29 DIAGNOSIS — M79605 Pain in left leg: Secondary | ICD-10-CM | POA: Diagnosis not present

## 2021-05-29 DIAGNOSIS — M5459 Other low back pain: Secondary | ICD-10-CM | POA: Diagnosis not present

## 2021-05-29 DIAGNOSIS — M25662 Stiffness of left knee, not elsewhere classified: Secondary | ICD-10-CM | POA: Diagnosis not present

## 2021-05-29 DIAGNOSIS — M6281 Muscle weakness (generalized): Secondary | ICD-10-CM | POA: Diagnosis not present

## 2021-05-29 DIAGNOSIS — M25661 Stiffness of right knee, not elsewhere classified: Secondary | ICD-10-CM | POA: Diagnosis not present

## 2021-05-29 DIAGNOSIS — R208 Other disturbances of skin sensation: Secondary | ICD-10-CM | POA: Diagnosis not present

## 2021-05-29 DIAGNOSIS — R2689 Other abnormalities of gait and mobility: Secondary | ICD-10-CM | POA: Diagnosis not present

## 2021-05-29 DIAGNOSIS — R293 Abnormal posture: Secondary | ICD-10-CM | POA: Diagnosis not present

## 2021-05-29 DIAGNOSIS — R2681 Unsteadiness on feet: Secondary | ICD-10-CM | POA: Diagnosis not present

## 2021-06-17 NOTE — Progress Notes (Signed)
Cardiology Office Note:    Date:  06/18/2021   ID:  Pamela Dominguez, DOB 14-Mar-1933, MRN [id number]  PCP:  [name]., MD  Cardiologist:  [name], MD    Referring MD: [name]., MD    ASSESSMENT:    1. Coronary artery disease of native artery of native heart with stable angina pectoris ([place])   2. Essential hypertension   3. Hyperlipidemia LDL goal <70   4. Bilateral lower extremity edema    PLAN:    In order of problems listed above:  Pamela Dominguez continues to do well with her CAD having no angina after PCI and on current medical treatment [place] Heart Association class I and continue clopidogrel along with lipid-lowering the high intensity statin and Questran. Stable BP at target continue current treatment MRA ARB Stable continue high intensity statin Questran Resolved   Next appointment: 9 months   Medication Adjustments/Labs and Tests Ordered: Current medicines are reviewed at length with the patient today.  Concerns regarding medicines are outlined above.  No orders of the defined types were placed in this encounter.  No orders of the defined types were placed in this encounter.   Chief Complaint  Patient presents with   Follow-up   Coronary Artery Disease    She had PCI and stent LAD 04/08/2018 with a calcium channel blocker   Leg Swelling    History of Present Illness:    Pamela Dominguez is a 87 y.o. female with a hx of CAD PCI and drug-eluting stent left anterior descending coronary artery 04/08/2018 hypertension hyperlipidemia and lower extremity edema due to calcium channel blocker last seen 05/29/2020.  When last seen her peripheral edema had improved after discontinuing calcium channel blocker and blood pressure is at target.  Compliance with diet, lifestyle and medications: Yes  She struggles 59 year old husband with dementia at home today she had a difficult time getting in to the car she told me her home blood pressures were less than  120/80. She requested new prescription for nitroglycerin Her peripheral edema has not recurred She has had no chest pain shortness of breath palpitation or syncope. She tolerates her statin without muscle pain or weakness She has had no bruising or bleeding    Recent labs [facility] 02/15/2021: Sodium 130 potassium 4.4 creatinine 0.63 liver function test normal cholesterol 157 LDL 57 triglycerides 80 HDL 83 TSH 3.33 all normal or at target Past Medical History:  Diagnosis Date   Abnormal cardiac CT angiography 04/04/2018   Abnormal CXR 01/21/2018   Angina, class III ([place]) 04/04/2018   Arthritis    "all over" (04/08/2018)   CAD S/P percutaneous coronary angioplasty and stent DES mLAD beyond 2nd diag 04/08/2018   Chest pain 01/22/2018   Chronic edema 05/19/2017   Chronic lower back pain    Complication of anesthesia 1989   mouth joint trouble after hysterctomy, told by dentist to haver anesthesia use bite block to keep mouth open same amount all over   Coronary artery disease    Coronary artery disease of native artery of native heart with stable angina pectoris ([place])    Diverticulosis    Diverticulosis of colon 09/28/2013   Diverticulosis of colon (without mention of hemorrhage) 09/28/2013   DJD (degenerative joint disease)    Essential hypertension 09/18/2015   Last Assessment & Plan:  Relevant Hx: Course: Daily Update: Today's Plan:this is stable for her at this time and will follow and on repeat she was  much better overall Electronically signed by: Mayer Camel, NP 09/18/15 1146   Family history of anesthesia complication    brother low bp, heart problems, ileus   GERD (gastroesophageal reflux disease)    High risk medication use 09/18/2015   History of diverticulosis 08/28/2017   Hyperlipidemia    Hyperlipidemia LDL goal <70 09/18/2015   Last Assessment & Plan:  Update her lipids for her fasting   Hyperlipidemia, unspecified 09/18/2015   Last Assessment &  Plan:  Update her lipids for her fasting   Hypertension    IBS (irritable bowel syndrome)    Irritable bowel syndrome 09/28/2013   Irritable bowel syndrome with both constipation and diarrhea 09/18/2015   Last Assessment & Plan:  She has more fecal incontinence and she has to wear a pad and that is part of her UTI issues, though the Lucrezia Starch has helped her about 75% to diminish this and for that she is pleased.   OA (osteoarthritis) of knee 09/27/2013   Osteopenia    Other and unspecified hyperlipidemia 09/28/2013   Pneumonia 1955   Primary osteoarthritis involving multiple joints 09/18/2015   Last Assessment & Plan:  This is stable for her and will follow along   SOB (shortness of breath) 02/11/2018   Squamous carcinoma ~ 2009   "right ear"   TMJ (dislocation of temporomandibular joint)    Unspecified essential hypertension 09/28/2013    Past Surgical History:  Procedure Laterality Date   [place] LIFT AND BLEPHAROPLASTY     CHOLECYSTECTOMY  1999   COLONOSCOPY  2013   CORONARY ANGIOPLASTY WITH STENT PLACEMENT  04/08/2018   CORONARY STENT INTERVENTION N/A 04/08/2018   Procedure: CORONARY STENT INTERVENTION;  Surgeon: Leonie Man, MD;  Location: [place] [facility];  Service: Cardiovascular;  Laterality: N/A;   JOINT REPLACEMENT     KNEE ARTHROSCOPY Left 1996   LEFT HEART CATH AND CORONARY ANGIOGRAPHY N/A 04/08/2018   Procedure: LEFT HEART CATH AND CORONARY ANGIOGRAPHY;  Surgeon: Leonie Man, MD;  Location: [place] [facility];  Service: Cardiovascular;  Laterality: N/A;   NASAL SINUS SURGERY  1968   SQUAMOUS CELL CARCINOMA EXCISION Right    "ear"   TONSILLECTOMY AND ADENOIDECTOMY  1942   TOTAL KNEE ARTHROPLASTY Right 09/27/2013   Procedure: RIGHT TOTAL KNEE ARTHROPLASTY;  Surgeon: Gearlean Alf, MD;  Location: [facility];  Service: Orthopedics;  Laterality: Right;    Current Medications: Current Meds  Medication Sig   acetaminophen  (TYLENOL) 650 MG CR tablet Take 650 mg by mouth daily as needed for pain.   atorvastatin (LIPITOR) 40 MG tablet Take 1 tablet by mouth daily at 6 pm   b complex vitamins tablet Take 1 tablet by mouth daily.   Biotin 10000 MCG TABS Take 10,000 mcg by mouth daily.   cetirizine (ZYRTEC) 10 MG tablet Take 10 mg by mouth daily.   cholestyramine (QUESTRAN) 4 G packet Take 4 g by mouth at bedtime.   clopidogrel (PLAVIX) 75 MG tablet Take 1 tablet (75 mg total) by mouth daily with breakfast.   famotidine (PEPCID) 40 MG tablet Take 40 mg by mouth daily.   Melatonin 5 MG CAPS Take 5 mg by mouth at bedtime as needed (sleep).   Multiple Vitamin (MULTIVITAMIN) tablet Take 1 tablet by mouth daily.   Multiple Vitamins-Minerals (OCUVITE PO) Take 1 tablet by mouth daily.   nitroGLYCERIN (NITROSTAT) 0.4 MG SL tablet Place 1  tablet (0.4 mg total) under the tongue every 5 (five) minutes as needed for chest pain.   pantoprazole (PROTONIX) 40 MG tablet Take 1 tablet (40 mg total) by mouth daily.   Polyethyl Glycol-Propyl Glycol (SYSTANE OP) Place 1 drop into both eyes daily as needed (dry eyes).   spironolactone (ALDACTONE) 25 MG tablet Take 25 mg by mouth 2 (two) times daily.    valsartan (DIOVAN) 320 MG tablet Take 1 tablet (320 mg total) by mouth daily.     Allergies:   Actonel [risedronate sodium], Alendronate, Nizatidine, Penicillins, and Quinine   Social History   Socioeconomic History   Marital status: Married    Spouse name: Not on file   Number of children: Not on file   Years of education: Not on file   Highest education level: Not on file  Occupational History   Not on file  Tobacco Use   Smoking status: Never   Smokeless tobacco: Never  Vaping Use   Vaping Use: Never used  Substance and Sexual Activity   Alcohol use: Never   Drug use: Never   Sexual activity: Yes  Other Topics Concern   Not on file  Social History Narrative   Not on file   Social Determinants of Health   Financial  Resource Strain: Not on file  Food Insecurity: Not on file  Transportation Needs: Not on file  Physical Activity: Not on file  Stress: Not on file  Social Connections: Not on file     Family History: The patient's family history includes Atrial fibrillation in her brother; Cancer in her father; Hypertension in her mother. ROS:   Please see the history of present illness.    All other systems reviewed and are negative.  EKGs/Labs/Other Studies Reviewed:    The following studies were reviewed today:    Recent Labs: See history   Physical Exam:    VS:  BP (!) 162/92   Pulse 98   Ht 5\' 1"  (1.549 m)   Wt 140 lb 12.8 oz (63.9 kg)   SpO2 99%   BMI 26.60 kg/m     Wt Readings from Last 3 Encounters:  06/18/21 140 lb 12.8 oz (63.9 kg)  12/15/20 142 lb (64.4 kg)  05/29/20 144 lb 9.6 oz (65.6 kg)     GEN:  Well nourished, well developed in no acute distress HEENT: Normal NECK: No JVD; No carotid bruits LYMPHATICS: No lymphadenopathy CARDIAC: RRR, no murmurs, rubs, gallops RESPIRATORY:  Clear to auscultation without rales, wheezing or rhonchi  ABDOMEN: Soft, non-tender, non-distended MUSCULOSKELETAL:  No edema; No deformity  SKIN: Warm and dry NEUROLOGIC:  Alert and oriented x 3 PSYCHIATRIC:  Normal affect    Signed, [name], MD  06/18/2021 11:01 AM    [place]

## 2021-06-18 ENCOUNTER — Encounter: Payer: Self-pay | Admitting: Cardiology

## 2021-06-18 ENCOUNTER — Other Ambulatory Visit: Payer: Self-pay

## 2021-06-18 ENCOUNTER — Ambulatory Visit: Payer: PPO | Admitting: Cardiology

## 2021-06-18 VITALS — BP 140/80 | HR 98 | Ht 61.0 in | Wt 140.8 lb

## 2021-06-18 DIAGNOSIS — E785 Hyperlipidemia, unspecified: Secondary | ICD-10-CM

## 2021-06-18 DIAGNOSIS — I25118 Atherosclerotic heart disease of native coronary artery with other forms of angina pectoris: Secondary | ICD-10-CM | POA: Diagnosis not present

## 2021-06-18 DIAGNOSIS — R600 Localized edema: Secondary | ICD-10-CM

## 2021-06-18 DIAGNOSIS — I10 Essential (primary) hypertension: Secondary | ICD-10-CM | POA: Diagnosis not present

## 2021-06-18 MED ORDER — NITROGLYCERIN 0.4 MG SL SUBL: 0.4000 mg | SUBLINGUAL_TABLET | SUBLINGUAL | 3 refills | Status: DC | PRN

## 2021-06-18 NOTE — Patient Instructions (Signed)

## 2021-06-18 NOTE — Addendum Note (Signed)
Addended by: Resa Miner I on: 06/18/2021 11:19 AM   Modules accepted: Orders

## 2021-06-28 DIAGNOSIS — M5416 Radiculopathy, lumbar region: Secondary | ICD-10-CM | POA: Diagnosis not present

## 2022-02-11 ENCOUNTER — Telehealth: Payer: Self-pay | Admitting: Cardiology

## 2022-02-11 NOTE — Telephone Encounter (Signed)
Anesthesia is listed in the chart as choice, TBD by the anesthesiologist.

## 2022-02-11 NOTE — Telephone Encounter (Signed)
   Pre-operative Risk Assessment    Patient Name: Pamela Dominguez  DOB: October 05, 1932 MRN: [id number]      Request for Surgical Clearance    Procedure:   Lt total knee arthroplasty  Date of Surgery:  Clearance 05/20/22                                 Surgeon:  Dr. Gaynelle Arabian Surgeon's Group or Practice Name:  [facility] Phone number:  [phone] Fax number:  [phone]   Type of Clearance Requested:   - Medical  - Pharmacy:  Hold Clopidogrel (Plavix)     Type of Anesthesia:  Not Indicated   Additional requests/questions:   Advise of any contraindications or recommendations for the patient prior to surgery, during the procedure, or in the post-operative period   Gaylyn Lambert   02/11/2022, 7:38 AM

## 2022-02-11 NOTE — Telephone Encounter (Signed)
   Name: Pamela Dominguez  DOB: 01/22/1933  MRN: [id number]  Primary Cardiologist: [name], MD   Preoperative team, please contact this patient and set up a phone call appointment and get type of anesthesia for further preoperative risk assessment. Please obtain consent and complete medication review. Thank you for your help.  I confirm that guidance regarding antiplatelet and oral anticoagulation therapy has been completed and, if necessary, noted below.    [place], [place] 02/11/2022, 10:50 AM [facility] [address] [place], [place] [address]

## 2022-02-13 NOTE — Telephone Encounter (Signed)
Pt has appt with Dr. Bettina Gavia 04/25/22. This appt is in time enough for pre op assessment to be done during appt with MD. I will update the requesting office pt hs appt in office with Dr. Bettina Gavia 04/25/22.

## 2022-03-06 ENCOUNTER — Other Ambulatory Visit: Payer: Self-pay

## 2022-03-06 ENCOUNTER — Telehealth: Payer: Self-pay | Admitting: Cardiology

## 2022-03-06 MED ORDER — ATORVASTATIN CALCIUM 40 MG PO TABS: 40.0000 mg | ORAL_TABLET | Freq: Every day | ORAL | 0 refills | Status: DC

## 2022-03-06 NOTE — Telephone Encounter (Signed)
*  STAT* If patient is at the pharmacy, call can be transferred to refill team.   1. Which medications need to be refilled? (please list name of each medication and dose if known) atorvastatin (LIPITOR) 40 MG tablet  2. Which pharmacy/location (including street and city if local pharmacy) is medication to be sent to? [facility] - [place], [place]  3. Do they need a 30 day or 90 day supply? [place]

## 2022-04-25 ENCOUNTER — Ambulatory Visit: Payer: PPO | Attending: Cardiology | Admitting: Cardiology

## 2022-04-25 ENCOUNTER — Encounter: Payer: Self-pay | Admitting: Cardiology

## 2022-04-25 VITALS — BP 138/84 | HR 67 | Ht 61.0 in | Wt 134.0 lb

## 2022-04-25 DIAGNOSIS — I10 Essential (primary) hypertension: Secondary | ICD-10-CM

## 2022-04-25 DIAGNOSIS — Z01810 Encounter for preprocedural cardiovascular examination: Secondary | ICD-10-CM

## 2022-04-25 DIAGNOSIS — R600 Localized edema: Secondary | ICD-10-CM

## 2022-04-25 DIAGNOSIS — E785 Hyperlipidemia, unspecified: Secondary | ICD-10-CM | POA: Diagnosis not present

## 2022-04-25 DIAGNOSIS — I25118 Atherosclerotic heart disease of native coronary artery with other forms of angina pectoris: Secondary | ICD-10-CM

## 2022-04-25 NOTE — Progress Notes (Signed)
Cardiology Office Note:    Date:  04/25/2022   ID:  Pamela Dominguez, DOB 1933/02/02, MRN [id number]  PCP:  [name]., MD  Cardiologist:  [name], MD    Referring MD: [name]., MD    ASSESSMENT:    1. Preop cardiovascular exam   2. Coronary artery disease of native artery of native heart with stable angina pectoris ([place])   3. Essential hypertension   4. Hyperlipidemia LDL goal <70   5. Bilateral lower extremity edema    PLAN:    In order of problems listed above:  From a cardiology perspective her concerns are CAD she has had PCI and stent and has stable CAD without heart failure or ongoing symptoms of ischemia.  From my perspective she is optimized for planned surgical procedure total knee arthroplasty needs no further preoperative evaluation.  She understands she needs to hold clopidogrel 7 full days prior to surgery I told her typically restarted 24 to 48 hours afterwards.  She anticipates staying overnight in a monitored bed we should check an EKG postoperative day 1 if there is any cardiac problems contact heart care. Stable CAD continue medical treatment Good control of hypertension continue ARB and MRA through the perioperative period Continue her lipid-lowering treatment with high intensity statin Resolved off calcium channel blocker   Next appointment: 9 months   Medication Adjustments/Labs and Tests Ordered: Current medicines are reviewed at length with the patient today.  Concerns regarding medicines are outlined above.  Orders Placed This Encounter  Procedures   EKG 12-Lead   No orders of the defined types were placed in this encounter.   Chief Complaint  Patient presents with   Follow-up   Pre-op Exam    History of Present Illness:    Pamela Dominguez is a 88 y.o. female with a hx of CAD with PCI drug-eluting stent LAD 04/08/2018 hypertension Hyperlipidemia and lower extremity edema due to calcium channel blocker last seen 06/18/2021.  She is  pending total knee arthroplasty 05/20/2022.  Compliance with diet, lifestyle and medications: Yes  We have a team assigned a preoperative evaluation and risk assessment but she was put into my office hours this morning She is severe knee pain that limits her but still does activities and has a normal exercise tolerance See PCI and stent she has had no recurrent anginal discomfort she is Had no edema shortness of breath palpitation or syncope.  Previously had lower extremity edema due to calcium channel blocker He has no history of heart failure  Past Medical History:  Diagnosis Date   Abnormal cardiac CT angiography 04/04/2018   Abnormal CXR 01/21/2018   Angina, class III ([place]) 04/04/2018   Arthritis    "all over" (04/08/2018)   CAD S/P percutaneous coronary angioplasty and stent DES mLAD beyond 2nd diag 04/08/2018   Chest pain 01/22/2018   Chronic edema 05/19/2017   Chronic lower back pain    Complication of anesthesia 1989   mouth joint trouble after hysterctomy, told by dentist to haver anesthesia use bite block to keep mouth open same amount all over   Coronary artery disease    Coronary artery disease of native artery of native heart with stable angina pectoris ([place])    Diverticulosis    Diverticulosis of colon 09/28/2013   Diverticulosis of colon (without mention of hemorrhage) 09/28/2013   DJD (degenerative joint disease)    Essential hypertension 09/18/2015   Last Assessment & Plan:  Relevant Hx: Course: Daily  Update: Today's Plan:this is stable for her at this time and will follow and on repeat she was much better overall Electronically signed by: Mayer Camel, NP 09/18/15 1146   Family history of anesthesia complication    brother low bp, heart problems, ileus   GERD (gastroesophageal reflux disease)    High risk medication use 09/18/2015   History of diverticulosis 08/28/2017   Hyperlipidemia    Hyperlipidemia LDL goal <70 09/18/2015   Last Assessment & Plan:  Update  her lipids for her fasting   Hyperlipidemia, unspecified 09/18/2015   Last Assessment & Plan:  Update her lipids for her fasting   Hypertension    IBS (irritable bowel syndrome)    Irritable bowel syndrome 09/28/2013   Irritable bowel syndrome with both constipation and diarrhea 09/18/2015   Last Assessment & Plan:  She has more fecal incontinence and she has to wear a pad and that is part of her UTI issues, though the Lucrezia Starch has helped her about 75% to diminish this and for that she is pleased.   OA (osteoarthritis) of knee 09/27/2013   Osteopenia    Other and unspecified hyperlipidemia 09/28/2013   Pneumonia 1955   Primary osteoarthritis involving multiple joints 09/18/2015   Last Assessment & Plan:  This is stable for her and will follow along   SOB (shortness of breath) 02/11/2018   Squamous carcinoma ~ 2009   "right ear"   TMJ (dislocation of temporomandibular joint)    Unspecified essential hypertension 09/28/2013    Past Surgical History:  Procedure Laterality Date   [place] LIFT AND BLEPHAROPLASTY     CHOLECYSTECTOMY  1999   COLONOSCOPY  2013   CORONARY ANGIOPLASTY WITH STENT PLACEMENT  04/08/2018   CORONARY STENT INTERVENTION N/A 04/08/2018   Procedure: CORONARY STENT INTERVENTION;  Surgeon: Leonie Man, MD;  Location: [facility];  Service: Cardiovascular;  Laterality: N/A;   JOINT REPLACEMENT     KNEE ARTHROSCOPY Left 1996   LEFT HEART CATH AND CORONARY ANGIOGRAPHY N/A 04/08/2018   Procedure: LEFT HEART CATH AND CORONARY ANGIOGRAPHY;  Surgeon: Leonie Man, MD;  Location: [facility];  Service: Cardiovascular;  Laterality: N/A;   NASAL SINUS SURGERY  1968   SQUAMOUS CELL CARCINOMA EXCISION Right    "ear"   TONSILLECTOMY AND ADENOIDECTOMY  1942   TOTAL KNEE ARTHROPLASTY Right 09/27/2013   Procedure: RIGHT TOTAL KNEE ARTHROPLASTY;  Surgeon: Gearlean Alf, MD;  Location: [facility];  Service: Orthopedics;   Laterality: Right;    Current Medications: Current Meds  Medication Sig   acetaminophen (TYLENOL) 650 MG CR tablet Take 650 mg by mouth daily as needed for pain.   atorvastatin (LIPITOR) 40 MG tablet Take 1 tablet (40 mg total) by mouth daily. Take 1 tablet by mouth daily at 6 pm   b complex vitamins tablet Take 1 tablet by mouth daily.   Biotin 10000 MCG TABS Take 10,000 mcg by mouth daily.   cetirizine (ZYRTEC) 10 MG tablet Take 10 mg by mouth daily.   cholestyramine (QUESTRAN) 4 G packet Take 4 g by mouth at bedtime.   clopidogrel (PLAVIX) 75 MG tablet Take 1 tablet (75 mg total) by mouth daily with breakfast.   famotidine (PEPCID) 40 MG tablet Take 40 mg by mouth daily.   Melatonin 5 MG CAPS Take 5 mg by mouth at bedtime as needed (sleep).   Multiple Vitamin (MULTIVITAMIN) tablet Take  1 tablet by mouth daily.   Multiple Vitamins-Minerals (OCUVITE PO) Take 1 tablet by mouth daily.   pantoprazole (PROTONIX) 40 MG tablet Take 1 tablet (40 mg total) by mouth daily.   Polyethyl Glycol-Propyl Glycol (SYSTANE OP) Place 1 drop into both eyes daily as needed (dry eyes).   spironolactone (ALDACTONE) 25 MG tablet Take 25 mg by mouth 2 (two) times daily.    valsartan (DIOVAN) 320 MG tablet Take 1 tablet (320 mg total) by mouth daily.     Allergies:   Actonel [risedronate sodium], Alendronate, Nizatidine, Penicillins, and Quinine   Social History   Socioeconomic History   Marital status: Married    Spouse name: Not on file   Number of children: Not on file   Years of education: Not on file   Highest education level: Not on file  Occupational History   Not on file  Tobacco Use   Smoking status: Never   Smokeless tobacco: Never  Vaping Use   Vaping Use: Never used  Substance and Sexual Activity   Alcohol use: Never   Drug use: Never   Sexual activity: Yes  Other Topics Concern   Not on file  Social History Narrative   Not on file   Social Determinants of Health   Financial  Resource Strain: Not on file  Food Insecurity: Not on file  Transportation Needs: Not on file  Physical Activity: Not on file  Stress: Not on file  Social Connections: Not on file     Family History: The patient's family history includes Atrial fibrillation in her brother; Cancer in her father; Hypertension in her mother. ROS:   Please see the history of present illness.    All other systems reviewed and are negative.  EKGs/Labs/Other Studies Reviewed:    The following studies were reviewed today:  EKG:  EKG ordered today and personally reviewed.  The ekg ordered today demonstrates sinus rhythm 1 APC otherwise normal EKG  Recent Labs: No results found for requested labs within last 365 days.  Recent Lipid Panel    Component Value Date/Time   CHOL 138 07/01/2019 0957   TRIG 100 07/01/2019 0957   HDL 75 07/01/2019 0957   CHOLHDL 1.8 07/01/2019 0957   LDLCALC 45 07/01/2019 0957    Physical Exam:    VS:  BP 138/84 (BP Location: Right Arm, Patient Position: Sitting, Cuff Size: Normal)   Pulse 67   Ht '5\' 1"'$  (1.549 m)   Wt 134 lb (60.8 kg)   SpO2 95%   BMI 25.32 kg/m     Wt Readings from Last 3 Encounters:  04/25/22 134 lb (60.8 kg)  06/18/21 140 lb 12.8 oz (63.9 kg)  12/15/20 142 lb (64.4 kg)     GEN:  Well nourished, well developed in no acute distress HEENT: Normal NECK: No JVD; No carotid bruits LYMPHATICS: No lymphadenopathy CARDIAC: RRR, no murmurs, rubs, gallops RESPIRATORY:  Clear to auscultation without rales, wheezing or rhonchi  ABDOMEN: Soft, non-tender, non-distended MUSCULOSKELETAL:  No edema; No deformity  SKIN: Warm and dry NEUROLOGIC:  Alert and oriented x 3 PSYCHIATRIC:  Normal affect    Signed, [name], MD  04/25/2022 12:14 PM    [facility]

## 2022-04-25 NOTE — Patient Instructions (Signed)
Medication Instructions:  Your physician recommends that you continue on your current medications as directed. Please refer to the Current Medication list given to you today.  Stop your Plavix 1 week prior to your surgery.  *If you need a refill on your cardiac medications before your next appointment, please call your pharmacy*   Lab Work: None ordered If you have labs (blood work) drawn today and your tests are completely normal, you will receive your results only by: [place] (if you have MyChart) OR A paper copy in the mail If you have any lab test that is abnormal or we need to change your treatment, we will call you to review the results.   Testing/Procedures: None ordered   Follow-Up: At [facility], you and your health needs are our priority.  As part of our continuing mission to provide you with exceptional heart care, we have created designated Provider Care Teams.  These Care Teams include your primary Cardiologist (physician) and Advanced Practice Providers (APPs -  Physician Assistants and Nurse Practitioners) who all work together to provide you with the care you need, when you need it.  We recommend signing up for the patient portal called "MyChart".  Sign up information is provided on this After Visit Summary.  MyChart is used to connect with patients for Virtual Visits (Telemedicine).  Patients are able to view lab/test results, encounter notes, upcoming appointments, etc.  Non-urgent messages can be sent to your provider as well.   To learn more about what you can do with MyChart, go to [email or web address].    Your next appointment:   9 month(s)  The format for your next appointment:   In Person  Provider:   [name], MD   Other Instructions NA

## 2022-04-29 NOTE — H&P (Signed)
TOTAL KNEE ADMISSION H&P  Patient is being admitted for left total knee arthroplasty.  Subjective:  Chief Complaint: Left knee pain.  HPI: Pamela Dominguez, 88 y.o. female has a history of pain and functional disability in the left knee due to arthritis and has failed non-surgical conservative treatments for greater than 12 weeks to include corticosteriod injections and activity modification. Onset of symptoms was gradual, starting >10 years ago with gradually worsening course since that time. The patient noted no past surgery on the left knee.  Patient currently rates pain in the left knee at 8 out of 10 with activity. Patient has night pain, worsening of pain with activity and weight bearing, pain that interferes with activities of daily living, and pain with passive range of motion. Patient has evidence of  bone-on-bone in all 3 compartments with large osteophytes and some tibial subluxation  by imaging studies. There is no active infection.  Patient Active Problem List   Diagnosis Date Noted   Hypertension    TMJ (dislocation of temporomandibular joint)    IBS (irritable bowel syndrome)    Hyperlipidemia    Family history of anesthesia complication    DJD (degenerative joint disease)    Diverticulosis    Coronary artery disease    Chronic lower back pain    Arthritis    Weight loss 02/03/2020   Stress at home 02/05/2019   CAD S/P percutaneous coronary angioplasty and stent DES mLAD beyond 2nd diag 04/08/2018   Coronary artery disease of native artery of native heart with stable angina pectoris (HCC)    Abnormal cardiac CT angiography 04/04/2018   Angina, class III ([place]) 04/04/2018   SOB (shortness of breath) 02/11/2018   Chest pain 01/22/2018   Abnormal CXR 01/21/2018   Lumbar spondylosis 12/17/2017   Degeneration of lumbar intervertebral disc 10/01/2017   History of diverticulosis 08/28/2017   Chronic edema 05/19/2017   High risk medication use 09/18/2015   Osteopenia 09/18/2015    Primary osteoarthritis involving multiple joints 09/18/2015   Irritable bowel syndrome with both constipation and diarrhea 09/18/2015   Hyperlipidemia LDL goal <70 09/18/2015   Hyperlipidemia, unspecified 09/18/2015   Essential hypertension 09/28/2013   GERD (gastroesophageal reflux disease) 09/28/2013   Irritable bowel syndrome 09/28/2013   Other and unspecified hyperlipidemia 09/28/2013   Diverticulosis of colon (without mention of hemorrhage) 09/28/2013   Diverticulosis of colon 09/28/2013   OA (osteoarthritis) of knee 09/73/5329   Complication of anesthesia 1989   Pneumonia 1955    Past Medical History:  Diagnosis Date   Abnormal cardiac CT angiography 04/04/2018   Abnormal CXR 01/21/2018   Angina, class III ([place]) 04/04/2018   Arthritis    "all over" (04/08/2018)   CAD S/P percutaneous coronary angioplasty and stent DES mLAD beyond 2nd diag 04/08/2018   Chest pain 01/22/2018   Chronic edema 05/19/2017   Chronic lower back pain    Complication of anesthesia 1989   mouth joint trouble after hysterctomy, told by dentist to haver anesthesia use bite block to keep mouth open same amount all over   Coronary artery disease    Coronary artery disease of native artery of native heart with stable angina pectoris ([place])    Diverticulosis    Diverticulosis of colon 09/28/2013   Diverticulosis of colon (without mention of hemorrhage) 09/28/2013   DJD (degenerative joint disease)    Essential hypertension 09/18/2015   Last Assessment & Plan:  Relevant Hx: Course: Daily Update: Today's Plan:this is stable for her at this  time and will follow and on repeat she was much better overall Electronically signed by: Mayer Camel, NP 09/18/15 1146   Family history of anesthesia complication    brother low bp, heart problems, ileus   GERD (gastroesophageal reflux disease)    High risk medication use 09/18/2015   History of diverticulosis 08/28/2017   Hyperlipidemia    Hyperlipidemia LDL goal  <70 09/18/2015   Last Assessment & Plan:  Update her lipids for her fasting   Hyperlipidemia, unspecified 09/18/2015   Last Assessment & Plan:  Update her lipids for her fasting   Hypertension    IBS (irritable bowel syndrome)    Irritable bowel syndrome 09/28/2013   Irritable bowel syndrome with both constipation and diarrhea 09/18/2015   Last Assessment & Plan:  She has more fecal incontinence and she has to wear a pad and that is part of her UTI issues, though the Lucrezia Starch has helped her about 75% to diminish this and for that she is pleased.   OA (osteoarthritis) of knee 09/27/2013   Osteopenia    Other and unspecified hyperlipidemia 09/28/2013   Pneumonia 1955   Primary osteoarthritis involving multiple joints 09/18/2015   Last Assessment & Plan:  This is stable for her and will follow along   SOB (shortness of breath) 02/11/2018   Squamous carcinoma ~ 2009   "right ear"   TMJ (dislocation of temporomandibular joint)    Unspecified essential hypertension 09/28/2013    Past Surgical History:  Procedure Laterality Date   [place] LIFT AND BLEPHAROPLASTY     CHOLECYSTECTOMY  1999   COLONOSCOPY  2013   CORONARY ANGIOPLASTY WITH STENT PLACEMENT  04/08/2018   CORONARY STENT INTERVENTION N/A 04/08/2018   Procedure: CORONARY STENT INTERVENTION;  Surgeon: Leonie Man, MD;  Location: [facility];  Service: Cardiovascular;  Laterality: N/A;   JOINT REPLACEMENT     KNEE ARTHROSCOPY Left 1996   LEFT HEART CATH AND CORONARY ANGIOGRAPHY N/A 04/08/2018   Procedure: LEFT HEART CATH AND CORONARY ANGIOGRAPHY;  Surgeon: Leonie Man, MD;  Location: [facility];  Service: Cardiovascular;  Laterality: N/A;   NASAL SINUS SURGERY  1968   SQUAMOUS CELL CARCINOMA EXCISION Right    "ear"   TONSILLECTOMY AND ADENOIDECTOMY  1942   TOTAL KNEE ARTHROPLASTY Right 09/27/2013   Procedure: RIGHT TOTAL KNEE ARTHROPLASTY;  Surgeon: Gearlean Alf, MD;   Location: [facility];  Service: Orthopedics;  Laterality: Right;    Prior to Admission medications   Medication Sig Start Date End Date Taking? Authorizing Provider  acetaminophen (TYLENOL) 650 MG CR tablet Take 650 mg by mouth daily as needed for pain.    [provider]  atorvastatin (LIPITOR) 40 MG tablet Take 1 tablet (40 mg total) by mouth daily. Take 1 tablet by mouth daily at 6 pm 03/06/22   Richardo Priest, MD  b complex vitamins tablet Take 1 tablet by mouth daily.    [provider]  Biotin 10000 MCG TABS Take 10,000 mcg by mouth daily.    [provider]  cetirizine (ZYRTEC) 10 MG tablet Take 10 mg by mouth daily.    [provider]  cholestyramine Lucrezia Starch) 4 G packet Take 4 g by mouth at bedtime.    [provider]  clopidogrel (PLAVIX) 75 MG tablet Take 1 tablet (75 mg total) by mouth daily with breakfast. 04/10/18   Isaiah Serge, NP  famotidine (PEPCID) 40 MG tablet Take 40 mg by mouth daily. 10/23/19   [provider]  Melatonin 5 MG CAPS Take 5 mg by mouth at bedtime as needed (sleep).    [provider]  Multiple Vitamin (MULTIVITAMIN) tablet Take 1 tablet by mouth daily.    [provider]  Multiple Vitamins-Minerals (OCUVITE PO) Take 1 tablet by mouth daily.    [provider]  nitroGLYCERIN (NITROSTAT) 0.4 MG SL tablet Place 1 tablet (0.4 mg total) under the tongue every 5 (five) minutes as needed for chest pain. 06/18/21 07/18/21  Richardo Priest, MD  pantoprazole (PROTONIX) 40 MG tablet Take 1 tablet (40 mg total) by mouth daily. 04/10/18   Isaiah Serge, NP  Polyethyl Glycol-Propyl Glycol (SYSTANE OP) Place 1 drop into both eyes daily as needed (dry eyes).    [provider]  spironolactone (ALDACTONE) 25 MG tablet Take 25 mg by mouth 2 (two) times daily.  01/23/18   [provider]  valsartan (DIOVAN) 320 MG tablet Take 1 tablet (320 mg total) by mouth daily. 11/29/19   Richardo Priest, MD    Allergies  Allergen Reactions   Actonel [Risedronate Sodium] Other (See Comments)    "felt like I was choking"   Alendronate Nausea And Vomiting   Nizatidine Itching and Rash    Axid-Brand Name   Penicillins Rash    Has patient had a PCN reaction causing immediate rash, facial/tongue/throat swelling, SOB or lightheadedness with hypotension: Yes Has patient had a PCN reaction causing severe rash involving mucus membranes or skin necrosis: No Has patient had a PCN reaction that required hospitalization: No Has patient had a PCN reaction occurring within the last 10 years: No If all of the above answers are "NO", then may proceed with Cephalosporin use.    Quinine Itching and Rash    Social History   Socioeconomic History   Marital status: Married    Spouse name: Not on file   Number of children: Not on file   Years of education: Not on file   Highest education level: Not on file  Occupational History   Not on file  Tobacco Use   Smoking status: Never   Smokeless tobacco: Never  Vaping Use   Vaping Use: Never used  Substance and Sexual Activity   Alcohol use: Never   Drug use: Never   Sexual activity: Yes  Other Topics Concern   Not on file  Social History Narrative   Not on file   Social Determinants of Health   Financial Resource Strain: Not on file  Food Insecurity: Not on file  Transportation Needs: Not on file  Physical Activity: Not on file  Stress: Not on file  Social Connections: Not on file  Intimate Partner Violence: Not on file    Tobacco Use: Low Risk  (04/25/2022)   Patient History    Smoking Tobacco Use: Never    Smokeless Tobacco Use: Never    Passive Exposure: Not on file   Social History   Substance and Sexual Activity  Alcohol Use Never    Family History  Problem Relation Age of Onset   Hypertension Mother    Cancer Father    Atrial fibrillation Brother     Review of Systems  Constitutional:  Negative for chills  and fever.  HENT:  Negative for congestion, sore throat and tinnitus.   Eyes:  Negative for double vision, photophobia and pain.  Respiratory:  Negative for cough,  shortness of breath and wheezing.   Cardiovascular:  Negative for chest pain, palpitations and orthopnea.  Gastrointestinal:  Negative for heartburn, nausea and vomiting.  Genitourinary:  Negative for dysuria, frequency and urgency.  Musculoskeletal:  Positive for joint pain.  Neurological:  Negative for dizziness, weakness and headaches.    Objective:  Physical Exam: Well nourished and well developed.  General: Alert and oriented x3, cooperative and pleasant, no acute distress.  Head: normocephalic, atraumatic, neck supple.  Eyes: EOMI.  Musculoskeletal:  Left Knee Exam: No effusion present. No swelling present. The Range of motion is: 10 to 120 degrees. Moderate crepitus on range of motion of the knee. Positive medial joint line tenderness. Positive lateral joint line tenderness. The knee is stable.  Calves soft and nontender. Motor function intact in LE. Strength 5/5 LE bilaterally. Neuro: Distal pulses 2+. Sensation to light touch intact in LE.   Imaging Review Plain radiographs demonstrate severe degenerative joint disease of the left knee. The overall alignment is neutral. The bone quality appears to be adequate for age and reported activity level.  Assessment/Plan:  End stage arthritis, left knee   The patient history, physical examination, clinical judgment of the provider and imaging studies are consistent with end stage degenerative joint disease of the left knee and total knee arthroplasty is deemed medically necessary. The treatment options including medical management, injection therapy arthroscopy and arthroplasty were discussed at length. The risks and benefits of total knee arthroplasty were presented and reviewed. The risks due to aseptic loosening, infection, stiffness, patella tracking problems,  thromboembolic complications and other imponderables were discussed. The patient acknowledged the explanation, agreed to proceed with the plan and consent was signed. Patient is being admitted for inpatient treatment for surgery, pain control, PT, OT, prophylactic antibiotics, VTE prophylaxis, progressive ambulation and ADLs and discharge planning. The patient is planning to be discharged  home .   Patient's anticipated LOS is less than 2 midnights, meeting these requirements: - Lives within 1 hour of care - Has a competent adult at home to recover with post-op recover - NO history of  - Chronic pain requiring opiods  - Diabetes  - Heart failure  - Heart attack  - Stroke  - DVT/VTE  - Cardiac arrhythmia  - Respiratory Failure/COPD  - Renal failure  - Anemia  - Advanced Liver disease  Therapy Plans: Outpatient therapy at [facility]: Lambert Rhonda W) Disposition: Home with daughters Planned DVT Prophylaxis: Plavix and 325 mg ASA QD DME Needed: None PCP: Edyth Gunnels, FNP (clearance received) Cardiologist: [name], MD (clearance received) TXA: IV Allergies: PCN (rash) Anesthesia Concerns: N/V BMI: 26.4 Last HgbA1c: 5.7% (03/15/22) Pharmacy: [facility]  Other: - Per Dr. Bettina Gavia, should obtain EKG on POD #1. Stopping plavix 7 days prior, does not typically take ASA. - Tramadol 50 mg QD  - Patient was instructed on what medications to stop prior to surgery. - Follow-up visit in 2 weeks with Dr. Wynelle Link - Begin physical therapy following surgery - Pre-operative lab work as pre-surgical testing - Prescriptions will be provided in hospital at time of discharge  Theresa Duty, PA-C Orthopedic Surgery [facility] [place]

## 2022-05-06 ENCOUNTER — Other Ambulatory Visit: Payer: Self-pay

## 2022-05-06 MED ORDER — ATORVASTATIN CALCIUM 40 MG PO TABS: 40.0000 mg | ORAL_TABLET | Freq: Every day | ORAL | 2 refills | Status: DC

## 2022-05-06 NOTE — Patient Instructions (Signed)
SURGICAL WAITING ROOM VISITATION Patients having surgery or a procedure may have no more than 2 support people in the waiting area - these visitors may rotate.   Children under the age of 64 must have an adult with them who is not the patient. If the patient needs to stay at the hospital during part of their recovery, the visitor guidelines for inpatient rooms apply. Pre-op nurse will coordinate an appropriate time for 1 support person to accompany patient in pre-op.  This support person may not rotate.    Please refer to the [facility] website for the visitor guidelines for Inpatients (after your surgery is over and you are in a regular room).      Your procedure is scheduled on: 05-20-22   Report to [place] Entrance    Report to admitting at 9:00 AM   Call this number if you have problems the morning of surgery [phone]   Do not eat food :After Midnight.   After Midnight you may have the following liquids until 8:30 AM DAY OF SURGERY  Water Non-Citrus Juices (without pulp, NO RED) Carbonated Beverages Black Coffee (NO MILK/CREAM OR CREAMERS, sugar ok)  Clear Tea (NO MILK/CREAM OR CREAMERS, sugar ok) regular and decaf                             Plain Jell-O (NO RED)                                           Fruit ices (not with fruit pulp, NO RED)                                     Popsicles (NO RED)                                                               Sports drinks like Gatorade (NO RED)                   The day of surgery:  Drink ONE (1) Pre-Surgery Clear Ensure or G2 at 8:30 AM the morning of surgery. Drink in one sitting. Do not sip.  This drink was given to you during your hospital  pre-op appointment visit. Nothing else to drink after completing the Pre-Surgery Clear Ensure or G2.          If you have questions, please contact your surgeon's office.   FOLLOW  ANY ADDITIONAL PRE OP INSTRUCTIONS YOU RECEIVED FROM YOUR SURGEON'S  OFFICE!!!     Oral Hygiene is also important to reduce your risk of infection.                                    Remember - BRUSH YOUR TEETH THE MORNING OF SURGERY WITH YOUR REGULAR TOOTHPASTE   Do NOT smoke after Midnight   Take these medicines the morning of surgery with A SIP OF WATER:   Zyrtec  Famotidine  Tramadol if  needed  Tylenol if needed                               You may not have any metal on your body including hair pins, jewelry, and body piercing             Do not wear make-up, lotions, powders, perfumes, or deodorant  Do not wear nail polish including gel and S&S, artificial/acrylic nails, or any other type of covering on natural nails including finger and toenails. If you have artificial nails, gel coating, etc. that needs to be removed by a nail salon please have this removed prior to surgery or surgery may need to be canceled/ delayed if the surgeon/ anesthesia feels like they are unable to be safely monitored.   Do not shave  48 hours prior to surgery.    Do not bring valuables to the hospital. [place].   Contacts, dentures or bridgework may not be worn into surgery.   Bring small overnight bag day of surgery.   DO NOT [place]. PHARMACY WILL DISPENSE MEDICATIONS LISTED ON YOUR MEDICATION LIST TO YOU DURING YOUR ADMISSION [facility]!    Special Instructions: Bring a copy of your healthcare power of attorney and living will documents the day of surgery if you haven't scanned them before.  Please read over the following fact sheets you were given: IF YOU HAVE QUESTIONS ABOUT YOUR PRE-OP INSTRUCTIONS PLEASE CALL [facility]  If you received a COVID test during your pre-op visit  it is requested that you wear a mask when out in public, stay away from anyone that may not be feeling well and notify your surgeon if you develop symptoms. If you test positive for Covid or have  been in contact with anyone that has tested positive in the last 10 days please notify you surgeon.  [facility] - Preparing for Surgery Before surgery, you can play an important role.  Because skin is not sterile, your skin needs to be as free of germs as possible.  You can reduce the number of germs on your skin by washing with CHG (chlorahexidine gluconate) soap before surgery.  CHG is an antiseptic cleaner which kills germs and bonds with the skin to continue killing germs even after washing. Please DO NOT use if you have an allergy to CHG or antibacterial soaps.  If your skin becomes reddened/irritated stop using the CHG and inform your nurse when you arrive at Short Stay. Do not shave (including legs and underarms) for at least 48 hours prior to the first CHG shower.  You may shave your face/neck.  Please follow these instructions carefully:  1.  Shower with CHG Soap the night before surgery and the  morning of surgery.  2.  If you choose to wash your hair, wash your hair first as usual with your normal  shampoo.  3.  After you shampoo, rinse your hair and body thoroughly to remove the shampoo.                             4.  Use CHG as you would any other liquid soap.  You can apply chg directly to the skin and wash.  Gently with a scrungie or clean washcloth.  5.  Apply the CHG Soap to your body  ONLY FROM THE NECK DOWN.   Do   not use on face/ open                           Wound or open sores. Avoid contact with eyes, ears mouth and   genitals (private parts).                       Wash face,  Genitals (private parts) with your normal soap.             6.  Wash thoroughly, paying special attention to the area where your    surgery  will be performed.  7.  Thoroughly rinse your body with warm water from the neck down.  8.  DO NOT shower/wash with your normal soap after using and rinsing off the CHG Soap.                9.  Pat yourself dry with a clean towel.            10.  Wear clean  pajamas.            11.  Place clean sheets on your bed the night of your first shower and do not  sleep with pets. Day of Surgery : Do not apply any lotions/deodorants the morning of surgery.  Please wear clean clothes to the hospital/surgery center.  FAILURE TO FOLLOW THESE INSTRUCTIONS MAY RESULT IN THE CANCELLATION OF YOUR SURGERY  PATIENT SIGNATURE_________________________________  NURSE SIGNATURE__________________________________  ________________________________________________________________________     Pamela Dominguez  An incentive spirometer is a tool that can help keep your lungs clear and active. This tool measures how well you are filling your lungs with each breath. Taking long deep breaths may help reverse or decrease the chance of developing breathing (pulmonary) problems (especially infection) following: A long period of time when you are unable to move or be active. BEFORE THE PROCEDURE  If the spirometer includes an indicator to show your best effort, your nurse or respiratory therapist will set it to a desired goal. If possible, sit up straight or lean slightly forward. Try not to slouch. Hold the incentive spirometer in an upright position. INSTRUCTIONS FOR USE  Sit on the edge of your bed if possible, or sit up as far as you can in bed or on a chair. Hold the incentive spirometer in an upright position. Breathe out normally. Place the mouthpiece in your mouth and seal your lips tightly around it. Breathe in slowly and as deeply as possible, raising the piston or the ball toward the top of the column. Hold your breath for 3-5 seconds or for as long as possible. Allow the piston or ball to fall to the bottom of the column. Remove the mouthpiece from your mouth and breathe out normally. Rest for a few seconds and repeat Steps 1 through 7 at least 10 times every 1-2 hours when you are awake. Take your time and take a few normal breaths between deep breaths. The  spirometer may include an indicator to show your best effort. Use the indicator as a goal to work toward during each repetition. After each set of 10 deep breaths, practice coughing to be sure your lungs are clear. If you have an incision (the cut made at the time of surgery), support your incision when coughing by placing a pillow or rolled up towels firmly against it. Once you are able to  get out of bed, walk around indoors and cough well. You may stop using the incentive spirometer when instructed by your caregiver.  RISKS AND COMPLICATIONS Take your time so you do not get dizzy or light-headed. If you are in pain, you may need to take or ask for pain medication before doing incentive spirometry. It is harder to take a deep breath if you are having pain. AFTER USE Rest and breathe slowly and easily. It can be helpful to keep track of a log of your progress. Your caregiver can provide you with a simple table to help with this. If you are using the spirometer at home, follow these instructions: [place] IF:  You are having difficultly using the spirometer. You have trouble using the spirometer as often as instructed. Your pain medication is not giving enough relief while using the spirometer. You develop fever of 100.5 F (38.1 C) or higher. SEEK IMMEDIATE MEDICAL CARE IF:  You cough up bloody sputum that had not been present before. You develop fever of 102 F (38.9 C) or greater. You develop worsening pain at or near the incision site. MAKE SURE YOU:  Understand these instructions. Will watch your condition. Will get help right away if you are not doing well or get worse. Document Released: 11/25/2006 Document Revised: 10/07/2011 Document Reviewed: 01/26/2007 [facility] Patient Information 2014 [place], [place].   ________________________________________________________________________

## 2022-05-07 ENCOUNTER — Encounter (HOSPITAL_COMMUNITY): Payer: Self-pay

## 2022-05-07 ENCOUNTER — Other Ambulatory Visit: Payer: Self-pay

## 2022-05-07 ENCOUNTER — Encounter (HOSPITAL_COMMUNITY)
Admission: RE | Admit: 2022-05-07 | Discharge: 2022-05-07 | Disposition: A | Discharge status: Home / Self Care | Payer: PPO | Source: Ambulatory Visit | Attending: Orthopedic Surgery | Admitting: Orthopedic Surgery

## 2022-05-07 VITALS — BP 131/79 | HR 67 | Temp 98.1°F | Resp 16 | Ht 61.0 in | Wt 153.0 lb

## 2022-05-07 DIAGNOSIS — Z01812 Encounter for preprocedural laboratory examination: Secondary | ICD-10-CM | POA: Insufficient documentation

## 2022-05-07 DIAGNOSIS — I2510 Atherosclerotic heart disease of native coronary artery without angina pectoris: Secondary | ICD-10-CM | POA: Insufficient documentation

## 2022-05-07 DIAGNOSIS — Z01818 Encounter for other preprocedural examination: Secondary | ICD-10-CM

## 2022-05-07 LAB — CBC
HCT: 39.7 % (ref 36.0–46.0)
Hemoglobin: 12.9 g/dL (ref 12.0–15.0)
MCH: 30.1 pg (ref 26.0–34.0)
MCHC: 32.5 g/dL (ref 30.0–36.0)
MCV: 92.5 fL (ref 80.0–100.0)
Platelets: 208 10*3/uL (ref 150–400)
RBC: 4.29 MIL/uL (ref 3.87–5.11)
RDW: 13.3 % (ref 11.5–15.5)
WBC: 6.1 10*3/uL (ref 4.0–10.5)
nRBC: 0 % (ref 0.0–0.2)

## 2022-05-07 LAB — BASIC METABOLIC PANEL
Anion gap: 7 (ref 5–15)
BUN: 17 mg/dL (ref 8–23)
CO2: 25 mmol/L (ref 22–32)
Calcium: 9.5 mg/dL (ref 8.9–10.3)
Chloride: 102 mmol/L (ref 98–111)
Creatinine, Ser: 0.51 mg/dL (ref 0.44–1.00)
GFR, Estimated: >60 mL/min (ref >60)
Glucose, Bld: 95 mg/dL (ref 70–99)
Potassium: 4.5 mmol/L (ref 3.5–5.1)
Sodium: 134 mmol/L — ABNORMAL LOW (ref 135–145)

## 2022-05-07 LAB — SURGICAL PCR SCREEN
MRSA, PCR: NEGATIVE
Staphylococcus aureus: NEGATIVE

## 2022-05-07 NOTE — Progress Notes (Addendum)
Anesthesia Review:  PCP: Pamela Dominguez 03/15/22 on chart LOV 03/14/22 on chart  PCP- DR Pamela Dominguez  Cardiologist : DR Pamela Dominguez- LOV 04/25/22 - preop exam  Chest x-ray : EKG : 04/25/22  Echo : Stress test: 2020  Cardiac Cath :  2019  CT Cors- 2019  Activity level:  can do a flight of stairs without difficuty  Sleep Study/ CPAP : none  Fasting Blood Sugar :      / Checks Blood Sugar -- times a day:   Blood Thinner/ Instructions /Last Dose: ASA / Instructions/ Last Dose :   Plavix - stop 7 days prior per pt  Hgba1c- 03/14/22- 5.7

## 2022-05-09 NOTE — Progress Notes (Signed)
Anesthesia Chart Review   Case: [id number] Date/Time: 05/20/22 1115   Procedure: TOTAL KNEE ARTHROPLASTY (Left: Knee)   Anesthesia type: Choice   Pre-op diagnosis: left knee osteoarthritis   Location: [facility] 09 / [facility]   Surgeons: Gaynelle Arabian, MD       DISCUSSION:88 y.o. never smoker with h/o HTN, CAD, left knee OA scheduled for above procedure 05/20/2022 with Dr. Gaynelle Arabian.   Pt last seen by cardiology 04/25/2022. Per OV note, "From a cardiology perspective her concerns are CAD she has had PCI and stent and has stable CAD without heart failure or ongoing symptoms of ischemia.  From my perspective she is optimized for planned surgical procedure total knee arthroplasty needs no further preoperative evaluation.  She understands she needs to hold clopidogrel 7 full days prior to surgery I told her typically restarted 24 to 48 hours afterwards.  She anticipates staying overnight in a monitored bed we should check an EKG postoperative day 1 if there is any cardiac problems contact heart care."  Anticipate pt can proceed with planned procedure barring acute status change.   VS: BP 131/79   Pulse 67   Temp 36.7 C (Oral)   Resp 16   Ht '5\' 1"'$  (1.549 m)   Wt 69.4 kg   SpO2 98%   BMI 28.91 kg/m   PROVIDERS: [name]., MD is PCP   Cardiologist : DR [name] LABS: Labs reviewed: Acceptable for surgery. (all labs ordered are listed, but only abnormal results are displayed)  Labs Reviewed  BASIC METABOLIC PANEL - Abnormal; Notable for the following components:      Result Value   Sodium 134 (*)    All other components within normal limits  SURGICAL PCR SCREEN  CBC     IMAGES:   EKG:   CV: Myocardial Perfusion 03/31/2019 The left ventricular ejection fraction is hyperdynamic (>65%). Nuclear stress EF: 88%. There was no ST segment deviation noted during stress. The study is normal. This is a low risk study Past Medical History:  Diagnosis Date   Abnormal  cardiac CT angiography 04/04/2018   Abnormal CXR 01/21/2018   Arthritis    "all over" (04/08/2018)   CAD S/P percutaneous coronary angioplasty and stent DES mLAD beyond 2nd diag 04/08/2018   Chest pain 01/22/2018   Chronic edema 05/19/2017   Chronic lower back pain    Complication of anesthesia 1989   mouth joint trouble after hysterctomy, told by dentist to haver anesthesia use bite block to keep mouth open same amount all over   Coronary artery disease    Diverticulosis    Diverticulosis of colon 09/28/2013   Diverticulosis of colon (without mention of hemorrhage) 09/28/2013   DJD (degenerative joint disease)    Essential hypertension 09/18/2015   Last Assessment & Plan:  Relevant Hx: Course: Daily Update: Today's Plan:this is stable for her at this time and will follow and on repeat she was much better overall Electronically signed by: Mayer Camel, NP 09/18/15 1146   Family history of anesthesia complication    brother low bp, heart problems, ileus   GERD (gastroesophageal reflux disease)    High risk medication use 09/18/2015   History of diverticulosis 08/28/2017   Hyperlipidemia    Hyperlipidemia LDL goal <70 09/18/2015   Last Assessment & Plan:  Update her lipids for her fasting   Hyperlipidemia, unspecified 09/18/2015   Last Assessment & Plan:  Update her lipids for her fasting   Hypertension  IBS (irritable bowel syndrome)    Irritable bowel syndrome 09/28/2013   Irritable bowel syndrome with both constipation and diarrhea 09/18/2015   Last Assessment & Plan:  She has more fecal incontinence and she has to wear a pad and that is part of her UTI issues, though the Lucrezia Starch has helped her about 75% to diminish this and for that she is pleased.   OA (osteoarthritis) of knee 09/27/2013   Osteopenia    Other and unspecified hyperlipidemia 09/28/2013   Pneumonia 1955   Primary osteoarthritis involving multiple joints 09/18/2015   Last Assessment & Plan:  This  is stable for her and will follow along   SOB (shortness of breath) 02/11/2018   Squamous carcinoma ~ 2009   "right ear"   TMJ (dislocation of temporomandibular joint)    Unspecified essential hypertension 09/28/2013    Past Surgical History:  Procedure Laterality Date   [place]   BROW LIFT AND BLEPHAROPLASTY     cataract surgery      bilateral   CHOLECYSTECTOMY  1999   COLONOSCOPY  2013   CORONARY ANGIOPLASTY WITH STENT PLACEMENT  04/08/2018   CORONARY STENT INTERVENTION N/A 04/08/2018   Procedure: CORONARY STENT INTERVENTION;  Surgeon: Leonie Man, MD;  Location: [facility];  Service: Cardiovascular;  Laterality: N/A;   JOINT REPLACEMENT     KNEE ARTHROSCOPY Left 1996   LEFT HEART CATH AND CORONARY ANGIOGRAPHY N/A 04/08/2018   Procedure: LEFT HEART CATH AND CORONARY ANGIOGRAPHY;  Surgeon: Leonie Man, MD;  Location: [place] [facility];  Service: Cardiovascular;  Laterality: N/A;   NASAL SINUS SURGERY  1968   SQUAMOUS CELL CARCINOMA EXCISION Right    "ear"   TONSILLECTOMY AND ADENOIDECTOMY  1942   TOTAL KNEE ARTHROPLASTY Right 09/27/2013   Procedure: RIGHT TOTAL KNEE ARTHROPLASTY;  Surgeon: Gearlean Alf, MD;  Location: [facility];  Service: Orthopedics;  Laterality: Right;    MEDICATIONS:  acetaminophen (TYLENOL) 650 MG CR tablet   atorvastatin (LIPITOR) 40 MG tablet   Biotin 10000 MCG TABS   cetirizine (ZYRTEC) 10 MG tablet   cholestyramine (QUESTRAN) 4 G packet   clopidogrel (PLAVIX) 75 MG tablet   famotidine (PEPCID) 40 MG tablet   Melatonin 5 MG CAPS   Multiple Vitamin (MULTIVITAMIN) tablet   Multiple Vitamins-Minerals (OCUVITE PO)   nitroGLYCERIN (NITROSTAT) 0.4 MG SL tablet   pantoprazole (PROTONIX) 40 MG tablet   Polyethyl Glycol-Propyl Glycol (SYSTANE OP)   spironolactone (ALDACTONE) 25 MG tablet   traMADol (ULTRAM) 50 MG tablet   valsartan (DIOVAN) 320 MG tablet   No current facility-administered  medications for this encounter.     Konrad Felix Ward, PA-C [facility] [phone]

## 2022-05-09 NOTE — Anesthesia Preprocedure Evaluation (Addendum)
Anesthesia Evaluation  Patient identified by MRN, date of birth, ID band Patient awake    Reviewed: Allergy & Precautions, NPO status , Patient's Chart, lab work & pertinent test results  History of Anesthesia Complications Negative for: history of anesthetic complications  Airway Mallampati: II  TM Distance: >3 FB Neck ROM: Full    Dental  (+) Dental Advisory Given, Teeth Intact   Pulmonary neg pulmonary ROS,    Pulmonary exam normal        Cardiovascular hypertension, Pt. on medications + CAD and + Cardiac Stents  Normal cardiovascular exam   '20 Myoperfusion - The left ventricular ejection fraction is hyperdynamic (>65%). Nuclear stress EF: 88%. There was no ST segment deviation noted during stress. The study is normal. This is a low risk study.    Neuro/Psych negative neurological ROS  negative psych ROS   GI/Hepatic Neg liver ROS, GERD  Medicated and Controlled, IBS    Endo/Other  negative endocrine ROS  Renal/GU negative Renal ROS     Musculoskeletal  (+) Arthritis , Osteoarthritis,   Chronic back pain    Abdominal   Peds  Hematology  On plavix, last dose 8 days ago     Anesthesia Other Findings   Reproductive/Obstetrics                           Anesthesia Physical Anesthesia Plan  ASA: 3  Anesthesia Plan: Spinal   Post-op Pain Management: Tylenol PO (pre-op)* and Regional block*   Induction:   PONV Risk Score and Plan: 2 and Treatment may vary due to age or medical condition and Propofol infusion  Airway Management Planned: Natural Airway and Simple Face Mask  Additional Equipment: None  Intra-op Plan:   Post-operative Plan:   Informed Consent: I have reviewed the patients History and Physical, chart, labs and discussed the procedure including the risks, benefits and alternatives for the proposed anesthesia with the patient or authorized representative who  has indicated his/her understanding and acceptance.       Plan Discussed with: CRNA and Anesthesiologist  Anesthesia Plan Comments: (Labs reviewed, platelets acceptable. Discussed risks and benefits of spinal, including spinal/epidural hematoma, infection, failed block, and PDPH. Patient expressed understanding and wished to proceed. )      Anesthesia Quick Evaluation

## 2022-05-20 ENCOUNTER — Ambulatory Visit (HOSPITAL_COMMUNITY): Payer: PPO | Admitting: Physician Assistant

## 2022-05-20 ENCOUNTER — Other Ambulatory Visit: Payer: Self-pay

## 2022-05-20 ENCOUNTER — Encounter (HOSPITAL_COMMUNITY)
Admission: RE | Disposition: A | Discharge status: Home / Self Care | Payer: Self-pay | Source: Ambulatory Visit | Attending: Orthopedic Surgery

## 2022-05-20 ENCOUNTER — Observation Stay (HOSPITAL_COMMUNITY)
Admission: RE | Admit: 2022-05-20 | Discharge: 2022-05-21 | Disposition: A | Discharge status: Home / Self Care | Payer: PPO | Source: Ambulatory Visit | Attending: Orthopedic Surgery | Admitting: Orthopedic Surgery

## 2022-05-20 ENCOUNTER — Ambulatory Visit (HOSPITAL_BASED_OUTPATIENT_CLINIC_OR_DEPARTMENT_OTHER): Payer: PPO | Admitting: Certified Registered Nurse Anesthetist

## 2022-05-20 ENCOUNTER — Encounter (HOSPITAL_COMMUNITY): Payer: Self-pay | Admitting: Orthopedic Surgery

## 2022-05-20 DIAGNOSIS — I10 Essential (primary) hypertension: Secondary | ICD-10-CM | POA: Insufficient documentation

## 2022-05-20 DIAGNOSIS — Z79899 Other long term (current) drug therapy: Secondary | ICD-10-CM | POA: Insufficient documentation

## 2022-05-20 DIAGNOSIS — M1712 Unilateral primary osteoarthritis, left knee: Principal | ICD-10-CM | POA: Insufficient documentation

## 2022-05-20 DIAGNOSIS — Z96651 Presence of right artificial knee joint: Secondary | ICD-10-CM | POA: Diagnosis not present

## 2022-05-20 DIAGNOSIS — I25118 Atherosclerotic heart disease of native coronary artery with other forms of angina pectoris: Secondary | ICD-10-CM | POA: Diagnosis not present

## 2022-05-20 DIAGNOSIS — Z85828 Personal history of other malignant neoplasm of skin: Secondary | ICD-10-CM | POA: Diagnosis not present

## 2022-05-20 DIAGNOSIS — I2510 Atherosclerotic heart disease of native coronary artery without angina pectoris: Secondary | ICD-10-CM | POA: Diagnosis not present

## 2022-05-20 DIAGNOSIS — Z955 Presence of coronary angioplasty implant and graft: Secondary | ICD-10-CM | POA: Diagnosis not present

## 2022-05-20 HISTORY — PX: TOTAL KNEE ARTHROPLASTY: SHX125

## 2022-05-20 HISTORY — DX: Unilateral primary osteoarthritis, left knee: M17.12

## 2022-05-20 LAB — HEMOGLOBIN A1C
Hgb A1c MFr Bld: 5.5 % (ref 4.8–5.6)
Mean Plasma Glucose: 111.15 mg/dL

## 2022-05-20 SURGERY — ARTHROPLASTY, KNEE, TOTAL
Anesthesia: Spinal | Site: Knee | Laterality: Left

## 2022-05-20 MED ORDER — POLYVINYL ALCOHOL 1.4 % OP SOLN: Freq: Every day | OPHTHALMIC | Status: DC | PRN

## 2022-05-20 MED ORDER — CLINDAMYCIN PHOSPHATE 900 MG/50ML IV SOLN
INTRAVENOUS | Status: AC
  Filled 2022-05-20: qty 50

## 2022-05-20 MED ORDER — ONDANSETRON HCL 4 MG/2ML IJ SOLN
INTRAMUSCULAR | Status: AC
  Filled 2022-05-20: qty 2

## 2022-05-20 MED ORDER — BISACODYL 10 MG RE SUPP: 10.0000 mg | Freq: Every day | RECTAL | Status: DC | PRN

## 2022-05-20 MED ORDER — MENTHOL 3 MG MT LOZG: 1.0000 | LOZENGE | OROMUCOSAL | Status: DC | PRN

## 2022-05-20 MED ORDER — SODIUM CHLORIDE (PF) 0.9 % IJ SOLN
INTRAMUSCULAR | Status: AC
  Filled 2022-05-20: qty 10

## 2022-05-20 MED ORDER — ONDANSETRON HCL 4 MG/2ML IJ SOLN
INTRAMUSCULAR | Status: DC | PRN
  Administered 2022-05-20: 4 mg via INTRAVENOUS

## 2022-05-20 MED ORDER — PHENYLEPHRINE HCL (PRESSORS) 10 MG/ML IV SOLN
INTRAVENOUS | Status: AC
  Filled 2022-05-20: qty 1

## 2022-05-20 MED ORDER — STERILE WATER FOR IRRIGATION IR SOLN
Status: DC | PRN
  Administered 2022-05-20: 2000 mL

## 2022-05-20 MED ORDER — VANCOMYCIN HCL IN DEXTROSE 1-5 GM/200ML-% IV SOLN: 1000.0000 mg | INTRAVENOUS | Status: DC

## 2022-05-20 MED ORDER — SODIUM CHLORIDE 0.9 % IR SOLN
Status: DC | PRN
  Administered 2022-05-20: 1000 mL

## 2022-05-20 MED ORDER — CLINDAMYCIN PHOSPHATE 900 MG/50ML IV SOLN
INTRAVENOUS | Status: DC | PRN
  Administered 2022-05-20: 900 mg via INTRAVENOUS

## 2022-05-20 MED ORDER — PHENYLEPHRINE HCL-NACL 20-0.9 MG/250ML-% IV SOLN
INTRAVENOUS | Status: DC | PRN
  Administered 2022-05-20: 25 ug/min via INTRAVENOUS

## 2022-05-20 MED ORDER — ASPIRIN 325 MG PO TBEC
325.0000 mg | DELAYED_RELEASE_TABLET | Freq: Every day | ORAL | Status: DC
  Administered 2022-05-21: 325 mg via ORAL
  Filled 2022-05-20: qty 1

## 2022-05-20 MED ORDER — DIPHENHYDRAMINE HCL 12.5 MG/5ML PO ELIX
12.5000 mg | ORAL_SOLUTION | ORAL | Status: DC | PRN
  Administered 2022-05-20 – 2022-05-21 (×2): 25 mg via ORAL
  Filled 2022-05-20 (×2): qty 10

## 2022-05-20 MED ORDER — PROPOFOL 1000 MG/100ML IV EMUL
INTRAVENOUS | Status: AC
  Filled 2022-05-20: qty 100

## 2022-05-20 MED ORDER — ACETAMINOPHEN 500 MG PO TABS
1000.0000 mg | ORAL_TABLET | Freq: Four times a day (QID) | ORAL | Status: AC
  Administered 2022-05-20 – 2022-05-21 (×4): 1000 mg via ORAL
  Filled 2022-05-20 (×4): qty 2

## 2022-05-20 MED ORDER — BUPIVACAINE LIPOSOME 1.3 % IJ SUSP
INTRAMUSCULAR | Status: AC
  Filled 2022-05-20: qty 20

## 2022-05-20 MED ORDER — MORPHINE SULFATE (PF) 2 MG/ML IV SOLN: 1.0000 mg | INTRAVENOUS | Status: DC | PRN

## 2022-05-20 MED ORDER — ORAL CARE MOUTH RINSE: 15.0000 mL | Freq: Once | OROMUCOSAL | Status: AC

## 2022-05-20 MED ORDER — PROPOFOL 10 MG/ML IV BOLUS
INTRAVENOUS | Status: DC | PRN
  Administered 2022-05-20: 20 mg via INTRAVENOUS

## 2022-05-20 MED ORDER — PHENOL 1.4 % MT LIQD: 1.0000 | OROMUCOSAL | Status: DC | PRN

## 2022-05-20 MED ORDER — EPHEDRINE SULFATE-NACL 50-0.9 MG/10ML-% IV SOSY
PREFILLED_SYRINGE | INTRAVENOUS | Status: DC | PRN
  Administered 2022-05-20: 5 mg via INTRAVENOUS

## 2022-05-20 MED ORDER — TRAMADOL HCL 50 MG PO TABS
50.0000 mg | ORAL_TABLET | Freq: Four times a day (QID) | ORAL | Status: DC | PRN
  Administered 2022-05-20: 50 mg via ORAL
  Filled 2022-05-20: qty 1

## 2022-05-20 MED ORDER — ONDANSETRON HCL 4 MG/2ML IJ SOLN: 4.0000 mg | Freq: Once | INTRAMUSCULAR | Status: DC | PRN

## 2022-05-20 MED ORDER — LORATADINE 10 MG PO TABS
10.0000 mg | ORAL_TABLET | Freq: Every day | ORAL | Status: DC
  Administered 2022-05-21: 10 mg via ORAL
  Filled 2022-05-20: qty 1

## 2022-05-20 MED ORDER — POVIDONE-IODINE 10 % EX SWAB
2.0000 | Freq: Once | CUTANEOUS | Status: AC
  Administered 2022-05-20: 2 via TOPICAL

## 2022-05-20 MED ORDER — LACTATED RINGERS IV SOLN: INTRAVENOUS | Status: DC

## 2022-05-20 MED ORDER — EPHEDRINE 5 MG/ML INJ
INTRAVENOUS | Status: AC
  Filled 2022-05-20: qty 5

## 2022-05-20 MED ORDER — DEXAMETHASONE SODIUM PHOSPHATE 10 MG/ML IJ SOLN
INTRAMUSCULAR | Status: AC
  Filled 2022-05-20: qty 1

## 2022-05-20 MED ORDER — CLOPIDOGREL BISULFATE 75 MG PO TABS
75.0000 mg | ORAL_TABLET | Freq: Every day | ORAL | Status: DC
  Administered 2022-05-21: 75 mg via ORAL
  Filled 2022-05-20: qty 1

## 2022-05-20 MED ORDER — OXYCODONE HCL 5 MG/5ML PO SOLN: 5.0000 mg | Freq: Once | ORAL | Status: DC | PRN

## 2022-05-20 MED ORDER — CHLORHEXIDINE GLUCONATE 0.12 % MT SOLN
15.0000 mL | Freq: Once | OROMUCOSAL | Status: AC
  Administered 2022-05-20: 15 mL via OROMUCOSAL

## 2022-05-20 MED ORDER — SODIUM CHLORIDE (PF) 0.9 % IJ SOLN
INTRAMUSCULAR | Status: DC | PRN
  Administered 2022-05-20: 60 mL

## 2022-05-20 MED ORDER — FLEET ENEMA 7-19 GM/118ML RE ENEM: 1.0000 | ENEMA | Freq: Once | RECTAL | Status: DC | PRN

## 2022-05-20 MED ORDER — DEXAMETHASONE SODIUM PHOSPHATE 10 MG/ML IJ SOLN
10.0000 mg | Freq: Once | INTRAMUSCULAR | Status: AC
  Administered 2022-05-21: 10 mg via INTRAVENOUS
  Filled 2022-05-20: qty 1

## 2022-05-20 MED ORDER — FENTANYL CITRATE PF 50 MCG/ML IJ SOSY
PREFILLED_SYRINGE | INTRAMUSCULAR | Status: AC
  Filled 2022-05-20: qty 2

## 2022-05-20 MED ORDER — METOCLOPRAMIDE HCL 5 MG/ML IJ SOLN: 5.0000 mg | Freq: Three times a day (TID) | INTRAMUSCULAR | Status: DC | PRN

## 2022-05-20 MED ORDER — FENTANYL CITRATE PF 50 MCG/ML IJ SOSY: 25.0000 ug | PREFILLED_SYRINGE | INTRAMUSCULAR | Status: DC | PRN

## 2022-05-20 MED ORDER — CEFAZOLIN SODIUM-DEXTROSE 2-4 GM/100ML-% IV SOLN
2.0000 g | Freq: Once | INTRAVENOUS | Status: DC
  Filled 2022-05-20: qty 100

## 2022-05-20 MED ORDER — IRBESARTAN 150 MG PO TABS
300.0000 mg | ORAL_TABLET | Freq: Every day | ORAL | Status: DC
  Filled 2022-05-20: qty 2

## 2022-05-20 MED ORDER — SPIRONOLACTONE 25 MG PO TABS
25.0000 mg | ORAL_TABLET | Freq: Two times a day (BID) | ORAL | Status: DC
  Administered 2022-05-21: 25 mg via ORAL
  Filled 2022-05-20: qty 1

## 2022-05-20 MED ORDER — ONDANSETRON HCL 4 MG PO TABS: 4.0000 mg | ORAL_TABLET | Freq: Four times a day (QID) | ORAL | Status: DC | PRN

## 2022-05-20 MED ORDER — BUPIVACAINE LIPOSOME 1.3 % IJ SUSP
INTRAMUSCULAR | Status: DC | PRN
  Administered 2022-05-20: 20 mL

## 2022-05-20 MED ORDER — DEXAMETHASONE SODIUM PHOSPHATE 10 MG/ML IJ SOLN
8.0000 mg | Freq: Once | INTRAMUSCULAR | Status: AC
  Administered 2022-05-20: 8 mg via INTRAVENOUS

## 2022-05-20 MED ORDER — SODIUM CHLORIDE (PF) 0.9 % IJ SOLN
INTRAMUSCULAR | Status: AC
  Filled 2022-05-20: qty 50

## 2022-05-20 MED ORDER — ACETAMINOPHEN 10 MG/ML IV SOLN
1000.0000 mg | Freq: Once | INTRAVENOUS | Status: DC
  Filled 2022-05-20: qty 100

## 2022-05-20 MED ORDER — BUPIVACAINE LIPOSOME 1.3 % IJ SUSP: 20.0000 mL | Freq: Once | INTRAMUSCULAR | Status: DC

## 2022-05-20 MED ORDER — CHOLESTYRAMINE LIGHT 4 G PO PACK
4.0000 g | PACK | Freq: Every day | ORAL | Status: DC
  Administered 2022-05-21: 4 g via ORAL
  Filled 2022-05-20 (×3): qty 1

## 2022-05-20 MED ORDER — METHOCARBAMOL 500 MG PO TABS
500.0000 mg | ORAL_TABLET | Freq: Four times a day (QID) | ORAL | Status: DC | PRN
  Administered 2022-05-20 (×2): 500 mg via ORAL
  Filled 2022-05-20 (×2): qty 1

## 2022-05-20 MED ORDER — NITROGLYCERIN 0.4 MG SL SUBL: 0.4000 mg | SUBLINGUAL_TABLET | SUBLINGUAL | Status: DC | PRN

## 2022-05-20 MED ORDER — OXYCODONE HCL 5 MG PO TABS
5.0000 mg | ORAL_TABLET | ORAL | Status: DC | PRN
  Administered 2022-05-20: 10 mg via ORAL
  Administered 2022-05-20: 5 mg via ORAL
  Administered 2022-05-21 (×2): 10 mg via ORAL
  Filled 2022-05-20 (×3): qty 2
  Filled 2022-05-20: qty 1

## 2022-05-20 MED ORDER — DOCUSATE SODIUM 100 MG PO CAPS
100.0000 mg | ORAL_CAPSULE | Freq: Two times a day (BID) | ORAL | Status: DC
  Administered 2022-05-20 – 2022-05-21 (×3): 100 mg via ORAL
  Filled 2022-05-20 (×3): qty 1

## 2022-05-20 MED ORDER — TRANEXAMIC ACID-NACL 1000-0.7 MG/100ML-% IV SOLN
1000.0000 mg | INTRAVENOUS | Status: AC
  Administered 2022-05-20: 1000 mg via INTRAVENOUS
  Filled 2022-05-20: qty 100

## 2022-05-20 MED ORDER — FAMOTIDINE 20 MG PO TABS
20.0000 mg | ORAL_TABLET | Freq: Every day | ORAL | Status: DC
  Administered 2022-05-21: 20 mg via ORAL
  Filled 2022-05-20: qty 1

## 2022-05-20 MED ORDER — OXYCODONE HCL 5 MG PO TABS: 5.0000 mg | ORAL_TABLET | Freq: Once | ORAL | Status: DC | PRN

## 2022-05-20 MED ORDER — ROPIVACAINE HCL 7.5 MG/ML IJ SOLN
INTRAMUSCULAR | Status: DC | PRN
  Administered 2022-05-20: 20 mL via PERINEURAL

## 2022-05-20 MED ORDER — 0.9 % SODIUM CHLORIDE (POUR BTL) OPTIME
TOPICAL | Status: DC | PRN
  Administered 2022-05-20: 1000 mL

## 2022-05-20 MED ORDER — POLYETHYLENE GLYCOL 3350 17 G PO PACK
17.0000 g | PACK | Freq: Every day | ORAL | Status: DC | PRN
  Administered 2022-05-21: 17 g via ORAL
  Filled 2022-05-20: qty 1

## 2022-05-20 MED ORDER — INSULIN ASPART 100 UNIT/ML IJ SOLN: 0.0000 [IU] | Freq: Three times a day (TID) | INTRAMUSCULAR | Status: DC

## 2022-05-20 MED ORDER — PROPOFOL 500 MG/50ML IV EMUL
INTRAVENOUS | Status: DC | PRN
  Administered 2022-05-20: 25 ug/kg/min via INTRAVENOUS

## 2022-05-20 MED ORDER — INSULIN ASPART 100 UNIT/ML IJ SOLN: 0.0000 [IU] | Freq: Every day | INTRAMUSCULAR | Status: DC

## 2022-05-20 MED ORDER — ATORVASTATIN CALCIUM 40 MG PO TABS: 40.0000 mg | ORAL_TABLET | Freq: Every day | ORAL | Status: DC

## 2022-05-20 MED ORDER — ONDANSETRON HCL 4 MG/2ML IJ SOLN: 4.0000 mg | Freq: Four times a day (QID) | INTRAMUSCULAR | Status: DC | PRN

## 2022-05-20 MED ORDER — PANTOPRAZOLE SODIUM 40 MG PO TBEC
40.0000 mg | DELAYED_RELEASE_TABLET | Freq: Every day | ORAL | Status: DC
  Administered 2022-05-21: 40 mg via ORAL
  Filled 2022-05-20: qty 1

## 2022-05-20 MED ORDER — METHOCARBAMOL 500 MG IVPB - SIMPLE MED: 500.0000 mg | Freq: Four times a day (QID) | INTRAVENOUS | Status: DC | PRN

## 2022-05-20 MED ORDER — METOCLOPRAMIDE HCL 5 MG PO TABS: 5.0000 mg | ORAL_TABLET | Freq: Three times a day (TID) | ORAL | Status: DC | PRN

## 2022-05-20 MED ORDER — PHENYLEPHRINE 80 MCG/ML (10ML) SYRINGE FOR IV PUSH (FOR BLOOD PRESSURE SUPPORT)
PREFILLED_SYRINGE | INTRAVENOUS | Status: DC | PRN
  Administered 2022-05-20: 80 ug via INTRAVENOUS

## 2022-05-20 MED ORDER — SODIUM CHLORIDE 0.9 % IV SOLN: INTRAVENOUS | Status: DC

## 2022-05-20 SURGICAL SUPPLY — 52 items
ATTUNE MED DOME PAT 38 KNEE (Knees) IMPLANT
ATTUNE PSFEM LTSZ5 NARCEM KNEE (Femur) IMPLANT
ATTUNE PSRP INSR SZ 5 10M KNEE (Insert) IMPLANT
BAG COUNTER SPONGE SURGICOUNT (BAG) IMPLANT
BAG ZIPLOCK 12X15 (MISCELLANEOUS) ×1 IMPLANT
BASEPLATE TIBIAL ROTATING SZ 4 (Knees) IMPLANT
BLADE SAG 18X100X1.27 (BLADE) ×1 IMPLANT
BLADE SAW SGTL 11.0X1.19X90.0M (BLADE) ×1 IMPLANT
BNDG ELASTIC 6X5.8 VLCR STR LF (GAUZE/BANDAGES/DRESSINGS) ×1 IMPLANT
BOWL SMART MIX CTS (DISPOSABLE) ×1 IMPLANT
CEMENT HV SMART SET (Cement) ×2 IMPLANT
CLSR STERI-STRIP ANTIMIC 1/2X4 (GAUZE/BANDAGES/DRESSINGS) IMPLANT
COVER SURGICAL LIGHT HANDLE (MISCELLANEOUS) ×1 IMPLANT
CUFF TOURN SGL QUICK 34 (TOURNIQUET CUFF) ×1
CUFF TRNQT CYL 34X4.125X (TOURNIQUET CUFF) ×1 IMPLANT
DRAPE INCISE IOBAN 66X45 STRL (DRAPES) ×1 IMPLANT
DRAPE U-SHAPE 47X51 STRL (DRAPES) ×1 IMPLANT
DRSG AQUACEL AG ADV 3.5X10 (GAUZE/BANDAGES/DRESSINGS) ×1 IMPLANT
DURAPREP 26ML APPLICATOR (WOUND CARE) ×1 IMPLANT
ELECT REM PT RETURN 15FT ADLT (MISCELLANEOUS) ×1 IMPLANT
GLOVE BIO SURGEON STRL SZ 6.5 (GLOVE) IMPLANT
GLOVE BIO SURGEON STRL SZ7.5 (GLOVE) IMPLANT
GLOVE BIO SURGEON STRL SZ8 (GLOVE) ×1 IMPLANT
GLOVE BIOGEL PI IND STRL 6.5 (GLOVE) IMPLANT
GLOVE BIOGEL PI IND STRL 7.0 (GLOVE) IMPLANT
GLOVE BIOGEL PI IND STRL 8 (GLOVE) ×1 IMPLANT
GOWN STRL REUS W/ TWL LRG LVL3 (GOWN DISPOSABLE) ×1 IMPLANT
GOWN STRL REUS W/ TWL XL LVL3 (GOWN DISPOSABLE) IMPLANT
GOWN STRL REUS W/TWL LRG LVL3 (GOWN DISPOSABLE) ×1
GOWN STRL REUS W/TWL XL LVL3 (GOWN DISPOSABLE) ×2
HANDPIECE INTERPULSE COAX TIP (DISPOSABLE) ×1
HOLDER FOLEY CATH W/STRAP (MISCELLANEOUS) IMPLANT
IMMOBILIZER KNEE 20 (SOFTGOODS) ×1
IMMOBILIZER KNEE 20 THIGH 36 (SOFTGOODS) ×1 IMPLANT
KIT TURNOVER KIT A (KITS) IMPLANT
MANIFOLD NEPTUNE II (INSTRUMENTS) ×1 IMPLANT
NS IRRIG 1000ML POUR BTL (IV SOLUTION) ×1 IMPLANT
PACK TOTAL KNEE CUSTOM (KITS) ×1 IMPLANT
PADDING CAST COTTON 6X4 STRL (CAST SUPPLIES) ×2 IMPLANT
PROTECTOR NERVE ULNAR (MISCELLANEOUS) ×1 IMPLANT
SET HNDPC FAN SPRY TIP SCT (DISPOSABLE) ×1 IMPLANT
SPIKE FLUID TRANSFER (MISCELLANEOUS) ×1 IMPLANT
STRIP CLOSURE SKIN 1/2X4 (GAUZE/BANDAGES/DRESSINGS) ×2 IMPLANT
SUT MNCRL AB 4-0 PS2 18 (SUTURE) ×1 IMPLANT
SUT STRATAFIX 0 PDS 27 VIOLET (SUTURE) ×1
SUT VIC AB 2-0 CT1 27 (SUTURE) ×3
SUT VIC AB 2-0 CT1 TAPERPNT 27 (SUTURE) ×3 IMPLANT
SUTURE STRATFX 0 PDS 27 VIOLET (SUTURE) ×1 IMPLANT
TRAY FOLEY MTR SLVR 14FR STAT (SET/KITS/TRAYS/PACK) IMPLANT
TUBE SUCTION HIGH CAP CLEAR NV (SUCTIONS) ×1 IMPLANT
WATER STERILE IRR 1000ML POUR (IV SOLUTION) ×2 IMPLANT
WRAP KNEE MAXI GEL POST OP (GAUZE/BANDAGES/DRESSINGS) ×1 IMPLANT

## 2022-05-20 NOTE — Op Note (Signed)
OPERATIVE REPORT-TOTAL KNEE ARTHROPLASTY   Pre-operative diagnosis- Osteoarthritis  Left knee(s)  Post-operative diagnosis- Osteoarthritis Left knee(s)  Procedure-  Left  Total Knee Arthroplasty  Surgeon- Dione Plover. Rubena Roseman, MD  Assistant- Molli Barrows, PA-C   Anesthesia-   Adductor canal block and spinal  EBL-25 mL   Drains None  Tourniquet time-  Total Tourniquet Time Documented: Thigh (Left) - 32 minutes Total: Thigh (Left) - 32 minutes     Complications- None  Condition-PACU - hemodynamically stable.   Brief Clinical Note  Pamela Dominguez is a 88 y.o. year old female with end stage OA of her left knee with progressively worsening pain and dysfunction. She has constant pain, with activity and at rest and significant functional deficits with difficulties even with ADLs. She has had extensive non-op management including analgesics, injections of cortisone and viscosupplements, and home exercise program, but remains in significant pain with significant dysfunction. Radiographs show bone on bone arthritis medial and patrellofemoral. She presents now for left Total Knee Arthroplasty.     Procedure in detail---   The patient is brought into the operating room and positioned supine on the operating table. After successful administration of  Adductor canal block and spinal,   a tourniquet is placed high on the  Left thigh(s) and the lower extremity is prepped and draped in the usual sterile fashion. Time out is performed by the operating team and then the  Left lower extremity is wrapped in Esmarch, knee flexed and the tourniquet inflated to 300 mmHg.       A midline incision is made with a ten blade through the subcutaneous tissue to the level of the extensor mechanism. A fresh blade is used to make a medial parapatellar arthrotomy. Soft tissue over the proximal medial tibia is subperiosteally elevated to the joint line with a knife and into the semimembranosus bursa with a Cobb  elevator. Soft tissue over the proximal lateral tibia is elevated with attention being paid to avoiding the patellar tendon on the tibial tubercle. The patella is everted, knee flexed 90 degrees and the ACL and PCL are removed. Findings are bone on bone medial and patellofemoral with large global osteophytes        The drill is used to create a starting hole in the distal femur and the canal is thoroughly irrigated with sterile saline to remove the fatty contents. The 5 degree Left  valgus alignment guide is placed into the femoral canal and the distal femoral cutting block is pinned to remove 9 mm off the distal femur. Resection is made with an oscillating saw.      The tibia is subluxed forward and the menisci are removed. The extramedullary alignment guide is placed referencing proximally at the medial aspect of the tibial tubercle and distally along the second metatarsal axis and tibial crest. The block is pinned to remove 80m off the more deficient medial  side. Resection is made with an oscillating saw. Size 4is the most appropriate size for the tibia and the proximal tibia is prepared with the modular drill and keel punch for that size.      The femoral sizing guide is placed and size 5 is most appropriate. Rotation is marked off the epicondylar axis and confirmed by creating a rectangular flexion gap at 90 degrees. The size 5 cutting block is pinned in this rotation and the anterior, posterior and chamfer cuts are made with the oscillating saw. The intercondylar block is then placed and that cut is  made.      Trial size 4 tibial component, trial size 5 narrow posterior stabilized femur and a 10  mm posterior stabilized rotating platform insert trial is placed. Full extension is achieved with excellent varus/valgus and anterior/posterior balance throughout full range of motion. The patella is everted and thickness measured to be 22  mm. Free hand resection is taken to 12 mm, a 35 template is placed, lug  holes are drilled, trial patella is placed, and it tracks normally. Osteophytes are removed off the posterior femur with the trial in place. All trials are removed and the cut bone surfaces prepared with pulsatile lavage. Cement is mixed and once ready for implantation, the size 4 tibial implant, size  5 narrow posterior stabilized femoral component, and the size 35 patella are cemented in place and the patella is held with the clamp. The trial insert is placed and the knee held in full extension. The Exparel (20 ml mixed with 60 ml saline) is injected into the extensor mechanism, posterior capsule, medial and lateral gutters and subcutaneous tissues.  All extruded cement is removed and once the cement is hard the permanent 10 mm posterior stabilized rotating platform insert is placed into the tibial tray.      The wound is copiously irrigated with saline solution and the extensor mechanism closed with # 0 Stratofix suture. The tourniquet is released for a total tourniquet time of 32  minutes. Flexion against gravity is 140 degrees and the patella tracks normally. Subcutaneous tissue is closed with 2.0 vicryl and subcuticular with running 4.0 Monocryl. The incision is cleaned and dried and steri-strips and a bulky sterile dressing are applied. The limb is placed into a knee immobilizer and the patient is awakened and transported to recovery in stable condition.      Please note that a surgical assistant was a medical necessity for this procedure in order to perform it in a safe and expeditious manner. Surgical assistant was necessary to retract the ligaments and vital neurovascular structures to prevent injury to them and also necessary for proper positioning of the limb to allow for anatomic placement of the prosthesis.   Dione Plover Alverto Shedd, MD    05/20/2022, 12:37 PM

## 2022-05-20 NOTE — Anesthesia Procedure Notes (Signed)
Anesthesia Regional Block: Adductor canal block   Pre-Anesthetic Checklist: , timeout performed,  Correct Patient, Correct Site, Correct Laterality,  Correct Procedure, Correct Position, site marked,  Risks and benefits discussed,  Surgical consent,  Pre-op evaluation,  At surgeon's request and post-op pain management  Laterality: Left  Prep: chloraprep       Needles:  Injection technique: Single-shot  Needle Type: Echogenic Needle     Needle Length: 10cm  Needle Gauge: 21     Additional Needles:   Narrative:  Start time: 05/20/2022 11:05 AM End time: 05/20/2022 11:08 AM Injection made incrementally with aspirations every 5 mL.  Performed by: Personally  Anesthesiologist: Audry Pili, MD  Additional Notes: No pain on injection. No increased resistance to injection. Injection made in 5cc increments. Good needle visualization. Patient tolerated the procedure well.

## 2022-05-20 NOTE — Evaluation (Signed)
Physical Therapy Evaluation Patient Details Name: Pamela Dominguez MRN: [id number] DOB: 07-Aug-1932 Today's Date: 05/20/2022  History of Present Illness  Pt is an 88yo female presenting s/p L-TKA on 05/20/22. PMH: CAD s/p stent, angina, chronic lower back pain, CAD, HTN, GERD, HLD, IBS, osteopenia, PNA, R-TKA 2015.   Clinical Impression  Pamela Dominguez is a 88 y.o. female POD 0 s/p L-TKA. Patient reports modified independence using SPC with mobility at baseline. Patient is now limited by functional impairments (see PT problem list below) and requires supervision for bed mobility and min guard for transfers. Patient was able to ambulate 15 feet with RW and min guard level of assist. Patient instructed in exercise to facilitate ROM and circulation to manage edema. Provided incentive spirometer and with Vcs pt able to achieve 1239m. Patient will benefit from continued skilled PT interventions to address impairments and progress towards PLOF. Acute PT will follow to progress mobility and stair training in preparation for safe discharge home.       Recommendations for follow up therapy are one component of a multi-disciplinary discharge planning process, led by the attending physician.  Recommendations may be updated based on patient status, additional functional criteria and insurance authorization.  Follow Up Recommendations Follow physician's recommendations for discharge plan and follow up therapies      Assistance Recommended at Discharge Frequent or constant Supervision/Assistance  Patient can return home with the following  A little help with walking and/or transfers;A little help with bathing/dressing/bathroom;Assistance with cooking/housework;Help with stairs or ramp for entrance;Assist for transportation    Equipment Recommendations Rolling walker (2 wheels) (Pt is 5'1" and will need a youth RW (her current walker is too big, belonged to her husband who was over 6' tall).)  Recommendations for  Other Services       Functional Status Assessment Patient has had a recent decline in their functional status and demonstrates the ability to make significant improvements in function in a reasonable and predictable amount of time.     Precautions / Restrictions Precautions Precautions: Fall;Knee Precaution Booklet Issued: No Precaution Comments: no pillow under the knee Restrictions Weight Bearing Restrictions: No LLE Weight Bearing: Weight bearing as tolerated      Mobility  Bed Mobility Overal bed mobility: Needs Assistance Bed Mobility: Supine to Sit     Supine to sit: Supervision     General bed mobility comments: for safety only, no physical assist required.    Transfers Overall transfer level: Needs assistance Equipment used: Rolling walker (2 wheels) Transfers: Sit to/from Stand Sit to Stand: Min assist           General transfer comment: Min assist for steadying of RW, no lift assist required, VCs for hand placement    Ambulation/Gait Ambulation/Gait assistance: Min guard, +2 safety/equipment Gait Distance (Feet): 15 Feet Assistive device: Rolling walker (2 wheels) Gait Pattern/deviations: Step-to pattern Gait velocity: decreased     General Gait Details: Pt ambulated with RW and min guard +2 for recliner follow, no physical assist required or overt LOB noted. Pt tentative about WB on LLE but with encouragement able to do so using BUE support on RW.  Stairs            Wheelchair Mobility    Modified Rankin (Stroke Patients Only)       Balance Overall balance assessment: Needs assistance Sitting-balance support: Feet supported, No upper extremity supported Sitting balance-Leahy Scale: Good     Standing balance support: Reliant on assistive device for balance,  During functional activity, Bilateral upper extremity supported Standing balance-Leahy Scale: Poor                               Pertinent Vitals/Pain Pain  Assessment Pain Assessment: 0-10 Pain Score: 4  Pain Location: left knee Pain Descriptors / Indicators: Operative site guarding Pain Intervention(s): Limited activity within patient's tolerance, Monitored during session, Repositioned, Ice applied    Home Living Family/patient expects to be discharged to:: Private residence Living Arrangements: Alone Available Help at Discharge: Family;Available 24 hours/day (Daughter Almyra Free) Type of Home: House Home Access: Ramped entrance       Home Layout: One level;Other (Comment) (Pt has single step down into the living room but reports she doesn't have to use it during rehab process) Home Equipment: Cane - single point;Rolling Walker (2 wheels);BSC/3in1;Grab bars - toilet;Grab bars - tub/shower;Hand held shower head (Pt does have RW but reports it was her husbands and he was over 2f tall.)      Prior Function Prior Level of Function : Independent/Modified Independent;Driving             Mobility Comments: uses SPC for community mobility, furniture surfs for household mobility at baseline ADLs Comments: IND     Hand Dominance        Extremity/Trunk Assessment   Upper Extremity Assessment Upper Extremity Assessment: Overall WFL for tasks assessed    Lower Extremity Assessment Lower Extremity Assessment: LLE deficits/detail;RLE deficits/detail RLE Deficits / Details: MMT ank DF/PF 5/5 RLE Sensation: WNL LLE Deficits / Details: MMT ank DF/PF 5/5, no extensor lag noted LLE Sensation: WNL    Cervical / Trunk Assessment Cervical / Trunk Assessment: Kyphotic  Communication   Communication: HOH (wears hearing aides but did not bring them.)  Cognition Arousal/Alertness: Awake/alert Behavior During Therapy: WFL for tasks assessed/performed Overall Cognitive Status: Within Functional Limits for tasks assessed                                          General Comments General comments (skin integrity, edema, etc.):  Daughter JAlmyra Freepresent    Exercises Total Joint Exercises Ankle Circles/Pumps: AROM, Both, 10 reps   Assessment/Plan    PT Assessment Patient needs continued PT services  PT Problem List Decreased strength;Decreased range of motion;Decreased activity tolerance;Decreased balance;Decreased mobility;Decreased coordination;Pain       PT Treatment Interventions DME instruction;Gait training;Stair training;Functional mobility training;Therapeutic activities;Therapeutic exercise;Balance training;Neuromuscular re-education;Patient/family education    PT Goals (Current goals can be found in the Care Plan section)  Acute Rehab PT Goals Patient Stated Goal: Walk without pain PT Goal Formulation: With patient Time For Goal Achievement: 05/27/22 Potential to Achieve Goals: Good    Frequency 7X/week     Co-evaluation               AM-PAC PT "6 Clicks" Mobility  Outcome Measure Help needed turning from your back to your side while in a flat bed without using bedrails?: None Help needed moving from lying on your back to sitting on the side of a flat bed without using bedrails?: A Little Help needed moving to and from a bed to a chair (including a wheelchair)?: A Little Help needed standing up from a chair using your arms (e.g., wheelchair or bedside chair)?: A Little Help needed to walk in hospital room?: A Little Help needed  climbing 3-5 steps with a railing? : A Little 6 Click Score: 19    End of Session Equipment Utilized During Treatment: Gait belt Activity Tolerance: Patient tolerated treatment well;No increased pain Patient left: in chair;with call bell/phone within reach;with chair alarm set;with family/visitor present;with SCD's reapplied Nurse Communication: Mobility status PT Visit Diagnosis: Pain;Difficulty in walking, not elsewhere classified (R26.2) Pain - Right/Left: Left Pain - part of body: Knee    Time: 2449-7530 PT Time Calculation (min) (ACUTE ONLY): 30  min   Charges:   PT Evaluation $PT Eval Low Complexity: 1 Low PT Treatments $Gait Training: 8-22 mins        Coolidge Breeze, PT, DPT [facility] Office: [phone] Weekend pager: [phone]  Coolidge Breeze 05/20/2022, 6:01 PM

## 2022-05-20 NOTE — Interval H&P Note (Signed)
History and Physical Interval Note:  05/20/2022 9:21 AM  Pamela Dominguez  has presented today for surgery, with the diagnosis of left knee osteoarthritis.  The various methods of treatment have been discussed with the patient and family. After consideration of risks, benefits and other options for treatment, the patient has consented to  Procedure(s): TOTAL KNEE ARTHROPLASTY (Left) as a surgical intervention.  The patient's history has been reviewed, patient examined, no change in status, stable for surgery.  I have reviewed the patient's chart and labs.  Questions were answered to the patient's satisfaction.     Pilar Plate Tyreak Reagle

## 2022-05-20 NOTE — Anesthesia Procedure Notes (Signed)
Procedure Name: MAC Date/Time: 05/20/2022 11:27 AM  Performed by: Deliah Boston, CRNAPre-anesthesia Checklist: Patient identified, Emergency Drugs available, Suction available and Patient being monitored Patient Re-evaluated:Patient Re-evaluated prior to induction Oxygen Delivery Method: Simple face mask Placement Confirmation: positive ETCO2 and breath sounds checked- equal and bilateral

## 2022-05-20 NOTE — Anesthesia Postprocedure Evaluation (Signed)
Anesthesia Post Note  Patient: Pamela Dominguez  Procedure(s) Performed: TOTAL KNEE ARTHROPLASTY (Left: Knee)     Patient location during evaluation: PACU Anesthesia Type: Spinal Level of consciousness: awake and alert Pain management: pain level controlled Vital Signs Assessment: post-procedure vital signs reviewed and stable Respiratory status: spontaneous breathing and respiratory function stable Cardiovascular status: blood pressure returned to baseline and stable Postop Assessment: spinal receding and no apparent nausea or vomiting Anesthetic complications: no   No notable events documented.  Last Vitals:  Vitals:   05/20/22 1347 05/20/22 1401  BP: 112/60 110/62  Pulse: 86 84  Resp: 13 19  Temp:  (!) 36.1 C  SpO2: 98% 100%    Last Pain:  Vitals:   05/20/22 1401  TempSrc:   PainSc: 0-No pain                 Audry Pili

## 2022-05-20 NOTE — Discharge Instructions (Signed)
Pamela Arabian, MD Total Joint Specialist [facility] [address] [place], [place] [address] [phone]  TOTAL KNEE REPLACEMENT POSTOPERATIVE DIRECTIONS    Knee Rehabilitation, Guidelines Following Surgery  Results after knee surgery are often greatly improved when you follow the exercise, range of motion and muscle strengthening exercises prescribed by your doctor. Safety measures are also important to protect the knee from further injury. If any of these exercises cause you to have increased pain or swelling in your knee joint, decrease the amount until you are comfortable again and slowly increase them. If you have problems or questions, call your caregiver or physical therapist for advice.   BLOOD CLOT PREVENTION In addition to Plavix, take a 325 mg Aspirin once a day for three weeks following surgery. Then take an 81 mg Aspirin once a day for three weeks. Then discontinue Aspirin. You may resume your vitamins/supplements upon discharge from the hospital. Do not take any NSAIDs (Advil, Aleve, Ibuprofen, Meloxicam, etc.) until you have discontinued the 325 mg Aspirin.  HOME CARE INSTRUCTIONS  Remove items at home which could result in a fall. This includes throw rugs or furniture in walking pathways.  ICE to the affected knee as much as tolerated. Icing helps control swelling. If the swelling is well controlled you will be more comfortable and rehab easier. Continue to use ice on the knee for pain and swelling from surgery. You may notice swelling that will progress down to the foot and ankle. This is normal after surgery. Elevate the leg when you are not up walking on it.    Continue to use the breathing machine which will help keep your temperature down. It is common for your temperature to cycle up and down following surgery, especially at night when you are not up moving around and exerting yourself. The breathing machine keeps your lungs expanded and  your temperature down. Do not place pillow under the operative knee, focus on keeping the knee straight while resting  DIET You may resume your previous home diet once you are discharged from the hospital.  DRESSING / WOUND CARE / SHOWERING Keep your bulky bandage on for 2 days. On the third post-operative day you may remove the Ace bandage and gauze. There is a waterproof adhesive bandage on your skin which will stay in place until your first follow-up appointment. Once you remove this you will not need to place another bandage You may begin showering 3 days following surgery, but do not submerge the incision under water.  ACTIVITY For the first 5 days, the key is rest and control of pain and swelling Do your home exercises twice a day starting on post-operative day 3. On the days you go to physical therapy, just do the home exercises once that day. You should rest, ice and elevate the leg for 50 minutes out of every hour. Get up and walk/stretch for 10 minutes per hour. After 5 days you can increase your activity slowly as tolerated. Walk with your walker as instructed. Use the walker until you are comfortable transitioning to a cane. Walk with the cane in the opposite hand of the operative leg. You may discontinue the cane once you are comfortable and walking steadily. Avoid periods of inactivity such as sitting longer than an hour when not asleep. This helps prevent blood clots.  You may discontinue the knee immobilizer once you are able to perform a straight leg raise while lying down. You may resume a sexual relationship  in one month or when given the OK by your doctor.  You may return to work once you are cleared by your doctor.  Do not drive a car for 6 weeks or until released by your surgeon.  Do not drive while taking narcotics.  TED HOSE STOCKINGS Wear the elastic stockings on both legs for three weeks following surgery during the day. You may remove them at night for  sleeping.  WEIGHT BEARING Weight bearing as tolerated with assist device (walker, cane, etc) as directed, use it as long as suggested by your surgeon or therapist, typically at least 4-6 weeks.  POSTOPERATIVE CONSTIPATION PROTOCOL Constipation - defined medically as fewer than three stools per week and severe constipation as less than one stool per week.  One of the most common issues patients have following surgery is constipation.  Even if you have a regular bowel pattern at home, your normal regimen is likely to be disrupted due to multiple reasons following surgery.  Combination of anesthesia, postoperative narcotics, change in appetite and fluid intake all can affect your bowels.  In order to avoid complications following surgery, here are some recommendations in order to help you during your recovery period.  Colace (docusate) - Pick up an over-the-counter form of Colace or another stool softener and take twice a day as long as you are requiring postoperative pain medications.  Take with a full glass of water daily.  If you experience loose stools or diarrhea, hold the colace until you stool forms back up. If your symptoms do not get better within 1 week or if they get worse, check with your doctor. Dulcolax (bisacodyl) - Pick up over-the-counter and take as directed by the product packaging as needed to assist with the movement of your bowels.  Take with a full glass of water.  Use this product as needed if not relieved by Colace only.  MiraLax (polyethylene glycol) - Pick up over-the-counter to have on hand. MiraLax is a solution that will increase the amount of water in your bowels to assist with bowel movements.  Take as directed and can mix with a glass of water, juice, soda, coffee, or tea. Take if you go more than two days without a movement. Do not use MiraLax more than once per day. Call your doctor if you are still constipated or irregular after using this medication for 7 days in a  row.  If you continue to have problems with postoperative constipation, please contact the office for further assistance and recommendations.  If you experience "the worst abdominal pain ever" or develop nausea or vomiting, please contact the office immediatly for further recommendations for treatment.  ITCHING If you experience itching with your medications, try taking only a single pain pill, or even half a pain pill at a time.  You can also use Benadryl over the counter for itching or also to help with sleep.   MEDICATIONS See your medication summary on the "After Visit Summary" that the nursing staff will review with you prior to discharge.  You may have some home medications which will be placed on hold until you complete the course of blood thinner medication.  It is important for you to complete the blood thinner medication as prescribed by your surgeon.  Continue your approved medications as instructed at time of discharge.  PRECAUTIONS If you experience chest pain or shortness of breath - call 911 immediately for transfer to the hospital emergency department.  If you develop a fever greater  that 101 F, purulent drainage from wound, increased redness or drainage from wound, foul odor from the wound/dressing, or calf pain - CONTACT YOUR SURGEON.                                                   FOLLOW-UP APPOINTMENTS Make sure you keep all of your appointments after your operation with your surgeon and caregivers. You should call the office at the above phone number and make an appointment for approximately two weeks after the date of your surgery or on the date instructed by your surgeon outlined in the "After Visit Summary".  RANGE OF MOTION AND STRENGTHENING EXERCISES  Rehabilitation of the knee is important following a knee injury or an operation. After just a few days of immobilization, the muscles of the thigh which control the knee become weakened and shrink (atrophy). Knee exercises  are designed to build up the tone and strength of the thigh muscles and to improve knee motion. Often times heat used for twenty to thirty minutes before working out will loosen up your tissues and help with improving the range of motion but do not use heat for the first two weeks following surgery. These exercises can be done on a training (exercise) mat, on the floor, on a table or on a bed. Use what ever works the best and is most comfortable for you Knee exercises include:  Leg Lifts - While your knee is still immobilized in a splint or cast, you can do straight leg raises. Lift the leg to 60 degrees, hold for 3 sec, and slowly lower the leg. Repeat 10-20 times 2-3 times daily. Perform this exercise against resistance later as your knee gets better.  Quad and Hamstring Sets - Tighten up the muscle on the front of the thigh (Quad) and hold for 5-10 sec. Repeat this 10-20 times hourly. Hamstring sets are done by pushing the foot backward against an object and holding for 5-10 sec. Repeat as with quad sets.  Leg Slides: Lying on your back, slowly slide your foot toward your buttocks, bending your knee up off the floor (only go as far as is comfortable). Then slowly slide your foot back down until your leg is flat on the floor again. Angel Wings: Lying on your back spread your legs to the side as far apart as you can without causing discomfort.  A rehabilitation program following serious knee injuries can speed recovery and prevent re-injury in the future due to weakened muscles. Contact your doctor or a physical therapist for more information on knee rehabilitation.   POST-OPERATIVE OPIOID TAPER INSTRUCTIONS: It is important to wean off of your opioid medication as soon as possible. If you do not need pain medication after your surgery it is ok to stop day one. Opioids include: Codeine, Hydrocodone(Norco, Vicodin), Oxycodone(Percocet, oxycontin) and hydromorphone amongst others.  Long term and even short  term use of opiods can cause: Increased pain response Dependence Constipation Depression Respiratory depression And more.  Withdrawal symptoms can include Flu like symptoms Nausea, vomiting And more Techniques to manage these symptoms Hydrate well Eat regular healthy meals Stay active Use relaxation techniques(deep breathing, meditating, yoga) Do Not substitute Alcohol to help with tapering If you have been on opioids for less than two weeks and do not have pain than it is ok to stop all  together.  Plan to wean off of opioids This plan should start within one week post op of your joint replacement. Maintain the same interval or time between taking each dose and first decrease the dose.  Cut the total daily intake of opioids by one tablet each day Next start to increase the time between doses. The last dose that should be eliminated is the evening dose.   IF YOU ARE TRANSFERRED TO A SKILLED REHAB FACILITY If the patient is transferred to a skilled rehab facility following release from the hospital, a list of the current medications will be sent to the facility for the patient to continue.  When discharged from the skilled rehab facility, please have the facility set up the patient's [place] prior to being released. Also, the skilled facility will be responsible for providing the patient with their medications at time of release from the facility to include their pain medication, the muscle relaxants, and their blood thinner medication. If the patient is still at the rehab facility at time of the two week follow up appointment, the skilled rehab facility will also need to assist the patient in arranging follow up appointment in our office and any transportation needs.  MAKE SURE YOU:  Understand these instructions.  Get help right away if you are not doing well or get worse.   DENTAL ANTIBIOTICS:  In most cases prophylactic antibiotics for Dental procdeures after  total joint surgery are not necessary.  Exceptions are as follows:  1. History of prior total joint infection  2. Severely immunocompromised (Organ Transplant, cancer chemotherapy, Rheumatoid biologic medications such as [place])  3. Poorly controlled diabetes (A1C &gt; 8.0, blood glucose over 200)  If you have one of these conditions, contact your surgeon for an antibiotic prescription, prior to your dental procedure.    Pick up stool softner and laxative for home use following surgery while on pain medications. Do not submerge incision under water. Please use good hand washing techniques while changing dressing each day. May shower starting three days after surgery. Please use a clean towel to pat the incision dry following showers. Continue to use ice for pain and swelling after surgery. Do not use any lotions or creams on the incision until instructed by your surgeon.

## 2022-05-20 NOTE — Transfer of Care (Signed)
Immediate Anesthesia Transfer of Care Note  Patient: TIANAH LONARDO  Procedure(s) Performed: Procedure(s): TOTAL KNEE ARTHROPLASTY (Left)  Patient Location: PACU  Anesthesia Type:MAC, Regional and Spinal  Level of Consciousness: Patient easily awoken, sedated, comfortable, cooperative, following commands, responds to stimulation.   Airway & Oxygen Therapy: Patient spontaneously breathing, ventilating well, oxygen via simple oxygen mask.  Post-op Assessment: Report given to PACU RN, vital signs reviewed and stable.   Post vital signs: Reviewed and stable.  Complications: No apparent anesthesia complications  Last Vitals:  Vitals Value Taken Time  BP 87/49 05/20/22 1303  Temp    Pulse 61 05/20/22 1305  Resp 19 05/20/22 1305  SpO2 99 % 05/20/22 1305  Vitals shown include unvalidated device data.  Last Pain:  Vitals:   05/20/22 1105  TempSrc:   PainSc: 0-No pain      Patients Stated Pain Goal: 4 (84/13/24 4010)  Complications: No notable events documented.

## 2022-05-20 NOTE — Progress Notes (Signed)
Orthopedic Tech Progress Note Patient Details:  Pamela Dominguez 1933/04/06 [id number]  CPM Left Knee CPM Left Knee: On Left Knee Flexion (Degrees): 40 Left Knee Extension (Degrees): 10  Post Interventions Patient Tolerated: Well Instructions Provided: Care of device, Adjustment of device  [name] 05/20/2022, 1:15 PM

## 2022-05-20 NOTE — Anesthesia Procedure Notes (Signed)
Spinal  Patient location during procedure: OR Start time: 05/20/2022 11:30 AM End time: 05/20/2022 11:35 AM Reason for block: surgical anesthesia Staffing Performed: anesthesiologist  Anesthesiologist: Lynda Rainwater, MD Performed by: Lynda Rainwater, MD Authorized by: Audry Pili, MD   Preanesthetic Checklist Completed: patient identified, IV checked, site marked, risks and benefits discussed, surgical consent, monitors and equipment checked, pre-op evaluation and timeout performed Spinal Block Patient position: sitting Prep: DuraPrep Patient monitoring: heart rate, cardiac monitor, continuous pulse ox and blood pressure Approach: midline Location: L3-4 Injection technique: single-shot Needle Needle type: Quincke  Needle gauge: 22 G Needle length: 9 cm Assessment Sensory level: T4 Events: CSF return

## 2022-05-21 ENCOUNTER — Encounter (HOSPITAL_COMMUNITY): Payer: Self-pay | Admitting: Orthopedic Surgery

## 2022-05-21 DIAGNOSIS — M1712 Unilateral primary osteoarthritis, left knee: Secondary | ICD-10-CM | POA: Diagnosis not present

## 2022-05-21 DIAGNOSIS — I10 Essential (primary) hypertension: Secondary | ICD-10-CM | POA: Diagnosis not present

## 2022-05-21 DIAGNOSIS — Z85828 Personal history of other malignant neoplasm of skin: Secondary | ICD-10-CM | POA: Diagnosis not present

## 2022-05-21 DIAGNOSIS — I25118 Atherosclerotic heart disease of native coronary artery with other forms of angina pectoris: Secondary | ICD-10-CM | POA: Diagnosis not present

## 2022-05-21 LAB — CBC
HCT: 33.2 % — ABNORMAL LOW (ref 36.0–46.0)
Hemoglobin: 10.8 g/dL — ABNORMAL LOW (ref 12.0–15.0)
MCH: 30.2 pg (ref 26.0–34.0)
MCHC: 32.5 g/dL (ref 30.0–36.0)
MCV: 92.7 fL (ref 80.0–100.0)
Platelets: 156 10*3/uL (ref 150–400)
RBC: 3.58 MIL/uL — ABNORMAL LOW (ref 3.87–5.11)
RDW: 12.8 % (ref 11.5–15.5)
WBC: 11.9 10*3/uL — ABNORMAL HIGH (ref 4.0–10.5)
nRBC: 0 % (ref 0.0–0.2)

## 2022-05-21 LAB — BASIC METABOLIC PANEL
Anion gap: 7 (ref 5–15)
BUN: 13 mg/dL (ref 8–23)
CO2: 23 mmol/L (ref 22–32)
Calcium: 8.2 mg/dL — ABNORMAL LOW (ref 8.9–10.3)
Chloride: 99 mmol/L (ref 98–111)
Creatinine, Ser: 0.48 mg/dL (ref 0.44–1.00)
GFR, Estimated: >60 mL/min (ref >60)
Glucose, Bld: 154 mg/dL — ABNORMAL HIGH (ref 70–99)
Potassium: 4.1 mmol/L (ref 3.5–5.1)
Sodium: 129 mmol/L — ABNORMAL LOW (ref 135–145)

## 2022-05-21 MED ORDER — TRAMADOL HCL 50 MG PO TABS: 50.0000 mg | ORAL_TABLET | Freq: Four times a day (QID) | ORAL | 0 refills | Status: AC | PRN

## 2022-05-21 MED ORDER — OXYCODONE HCL 5 MG PO TABS: 5.0000 mg | ORAL_TABLET | Freq: Four times a day (QID) | ORAL | 0 refills | Status: DC | PRN

## 2022-05-21 MED ORDER — METHOCARBAMOL 500 MG PO TABS: 500.0000 mg | ORAL_TABLET | Freq: Four times a day (QID) | ORAL | 0 refills | Status: DC | PRN

## 2022-05-21 MED ORDER — ASPIRIN 325 MG PO TBEC: 325.0000 mg | DELAYED_RELEASE_TABLET | Freq: Every day | ORAL | 0 refills | Status: AC

## 2022-05-21 NOTE — Progress Notes (Signed)
Physical Therapy Treatment Patient Details Name: Pamela Dominguez MRN: [id number] DOB: [birth year] Today's Date: 05/21/2022   History of Present Illness Pt is an 88yo female presenting s/p L-TKA on 05/20/22. PMH: CAD s/p stent, angina, chronic lower back pain, CAD, HTN, GERD, HLD, IBS, osteopenia, PNA, R-TKA 2015.    PT Comments    POD #2 am session. Daughter was present and involved during session. Pt. Ambulated 66 ft +1 w/ RW w/ daughter assisting hands on. Educated patient and daughter on HEP, safety regarding hand placement during transfers, correct sequencing, safety belt, and proper technique during gait. Patient did not meet mobility goals will need a 2nd treatment in the afternoon.  Recommendations for follow up therapy are one component of a multi-disciplinary discharge planning process, led by the attending physician.  Recommendations may be updated based on patient status, additional functional criteria and insurance authorization.  Follow Up Recommendations  Follow physician's recommendations for discharge plan and follow up therapies     Assistance Recommended at Discharge Frequent or constant Supervision/Assistance  Patient can return home with the following A little help with walking and/or transfers;A little help with bathing/dressing/bathroom;Assistance with cooking/housework;Help with stairs or ramp for entrance;Assist for transportation   Equipment Recommendations  Rolling walker (2 wheels)    Recommendations for Other Services       Precautions / Restrictions Precautions Precautions: Fall;Knee Precaution Booklet Issued: No Precaution Comments: no pillow under the knee Restrictions Weight Bearing Restrictions: No LLE Weight Bearing: Weight bearing as tolerated     Mobility  Bed Mobility Overal bed mobility: Needs Assistance Bed Mobility: Supine to Sit     Supine to sit: Supervision     General bed mobility comments: for safety only, no physical assist  required; used safety belt to help assist with L LE; verbal cues needed for proper technique and safety regarding hand placement.    Transfers Overall transfer level: Independent Equipment used: Rolling walker (2 wheels) Transfers: Sit to/from Stand Sit to Stand: Min assist, Min guard           General transfer comment: VCs for hand placement and educated daughter on proper hand placement, safety belt, and proper transfer techniques.    Ambulation/Gait Ambulation/Gait assistance: Min guard (recliner following as a precaution) Gait Distance (Feet): 75 Feet Assistive device: Rolling walker (2 wheels) Gait Pattern/deviations: Step-to pattern Gait velocity: decreased     General Gait Details: Pt ambulated with daughter hands on assist w/ safety instructions w/ RW and min guard +1 with recliner follow. Hesisitate about WB on LLE but with encouragement able to do so using BUE support on RW; vc's needed for proper sequencing and safety with turns.   Stairs             Wheelchair Mobility    Modified Rankin (Stroke Patients Only)       Balance                                            Cognition Arousal/Alertness: Awake/alert Behavior During Therapy: WFL for tasks assessed/performed Overall Cognitive Status: Within Functional Limits for tasks assessed                                 General Comments: pleasant        Exercises Total Joint Exercises Ankle Circles/Pumps:  AROM, Both, 10 reps Quad Sets: AROM, 10 reps, Left Towel Squeeze: AROM, 10 reps, Both Heel Slides: AROM, 10 reps, Left    General Comments        Pertinent Vitals/Pain Pain Assessment Pain Assessment: Faces Pain Score: 2  Faces Pain Scale: Hurts a little bit Pain Location: left knee Pain Descriptors / Indicators: Operative site guarding Pain Intervention(s): Repositioned, Ice applied, Limited activity within patient's tolerance, Monitored during session     Home Living                          Prior Function            PT Goals (current goals can now be found in the care plan section) Acute Rehab PT Goals Patient Stated Goal: Walk without pain PT Goal Formulation: With patient Time For Goal Achievement: 05/27/22 Potential to Achieve Goals: Good Progress towards PT goals: Progressing toward goals    Frequency    7X/week      PT Plan Current plan remains appropriate    Co-evaluation              AM-PAC PT "6 Clicks" Mobility   Outcome Measure  Help needed turning from your back to your side while in a flat bed without using bedrails?: None Help needed moving from lying on your back to sitting on the side of a flat bed without using bedrails?: A Little Help needed moving to and from a bed to a chair (including a wheelchair)?: A Little Help needed standing up from a chair using your arms (e.g., wheelchair or bedside chair)?: A Little Help needed to walk in hospital room?: A Little Help needed climbing 3-5 steps with a railing? : A Little 6 Click Score: 19    End of Session Equipment Utilized During Treatment: Gait belt Activity Tolerance: Patient tolerated treatment well;No increased pain Patient left: in chair;with call bell/phone within reach;with chair alarm set;with family/visitor present Nurse Communication: Mobility status PT Visit Diagnosis: Pain;Difficulty in walking, not elsewhere classified (R26.2) Pain - Right/Left: Left Pain - part of body: Knee     Time: 1035-0100 PT Time Calculation (min) (ACUTE ONLY): 865 min  Charges:  $Gait Training: 8-22 mins $Therapeutic Exercise: 8-22 mins                       Trini Soldo 05/21/2022, 12:54 PM

## 2022-05-21 NOTE — Progress Notes (Signed)
Physical Therapy Treatment Patient Details Name: Pamela Dominguez MRN: [id number] DOB: Dec 27, 1932 Today's Date: 05/21/2022   History of Present Illness Pt is an 88yo female presenting s/p L-TKA on 05/20/22. PMH: CAD s/p stent, angina, chronic lower back pain, CAD, HTN, GERD, HLD, IBS, osteopenia, PNA, R-TKA 2015.    PT Comments    Patient POD x1 pm session. Ambulated 50 ft w/ RW using a step to pattern w/ daughter. Patient also practiced stair training with daughter requiring min assist; vc's needed for correct sequencing and safety techniques regarding RW and foot placement. Educated patient and daughter on HEP and stair training. Patient has met mobility goals and ready to discharge.  Recommendations for follow up therapy are one component of a multi-disciplinary discharge planning process, led by the attending physician.  Recommendations may be updated based on patient status, additional functional criteria and insurance authorization.  Follow Up Recommendations  Follow physician's recommendations for discharge plan and follow up therapies     Assistance Recommended at Discharge Frequent or constant Supervision/Assistance  Patient can return home with the following A little help with walking and/or transfers;A little help with bathing/dressing/bathroom;Assistance with cooking/housework;Help with stairs or ramp for entrance;Assist for transportation   Equipment Recommendations  Rolling walker (2 wheels)    Recommendations for Other Services       Precautions / Restrictions Precautions Precautions: Fall;Knee Precaution Booklet Issued: No Precaution Comments: no pillow under the knee Restrictions Weight Bearing Restrictions: No LLE Weight Bearing: Weight bearing as tolerated     Mobility  Bed Mobility Overal bed mobility: Needs Assistance Bed Mobility: Supine to Sit     Supine to sit: Supervision     General bed mobility comments: for safety only, no physical assist  required; used safety belt to help assist with L LE; verbal cues needed for proper technique and safety regarding hand placement.    Transfers Overall transfer level: Independent Equipment used: Rolling walker (2 wheels) Transfers: Sit to/from Stand Sit to Stand: Min assist, Min guard           General transfer comment: VCs for hand placement and educated daughter on proper hand placement, safety belt, and proper transfer techniques.    Ambulation/Gait Ambulation/Gait assistance: Min guard Gait Distance (Feet): 50 Feet Assistive device: Rolling walker (2 wheels) Gait Pattern/deviations: Step-to pattern Gait velocity: decreased     General Gait Details: Pt ambulated with daughter CGA w/ RW; vc's needed for safety with turns.   Stairs Stairs: Yes Stairs assistance: Min guard Stair Management: No rails, Step to pattern, Forwards, With walker Number of Stairs: 1 (up and down)     Wheelchair Mobility    Modified Rankin (Stroke Patients Only)       Balance                                            Cognition Arousal/Alertness: Awake/alert Behavior During Therapy: WFL for tasks assessed/performed Overall Cognitive Status: Within Functional Limits for tasks assessed                                 General Comments: pleasant        Exercises Total Joint Exercises Short Arc Quad: AROM, Left, 10 reps Hip ABduction/ADduction: Left, 10 reps, AAROM Straight Leg Raises: AAROM, Left, 10 reps    General  Comments        Pertinent Vitals/Pain Pain Assessment Pain Assessment: 0-10 Pain Score: 7  Faces Pain Scale: Hurts little more Pain Location: left knee Pain Descriptors / Indicators: Operative site guarding Pain Intervention(s): Ice applied, Monitored during session, Limited activity within patient's tolerance, Repositioned    Home Living                          Prior Function            PT Goals (current  goals can now be found in the care plan section) Acute Rehab PT Goals Patient Stated Goal: Walk without pain PT Goal Formulation: With patient Time For Goal Achievement: 05/27/22 Potential to Achieve Goals: Good Progress towards PT goals: Progressing toward goals;Goals met/education completed, patient discharged from PT    Frequency    7X/week      PT Plan Current plan remains appropriate    Co-evaluation              AM-PAC PT "6 Clicks" Mobility   Outcome Measure  Help needed turning from your back to your side while in a flat bed without using bedrails?: None Help needed moving from lying on your back to sitting on the side of a flat bed without using bedrails?: A Little Help needed moving to and from a bed to a chair (including a wheelchair)?: A Little Help needed standing up from a chair using your arms (e.g., wheelchair or bedside chair)?: A Little Help needed to walk in hospital room?: A Little Help needed climbing 3-5 steps with a railing? : A Little 6 Click Score: 19    End of Session Equipment Utilized During Treatment: Gait belt Activity Tolerance: Patient tolerated treatment well;No increased pain Patient left: with family/visitor present;in bed;with call bell/phone within reach Nurse Communication: Other (comment) (patient met mobility goals for d/c) PT Visit Diagnosis: Pain;Difficulty in walking, not elsewhere classified (R26.2) Pain - Right/Left: Left Pain - part of body: Knee     Time: 3507-5732 PT Time Calculation (min) (ACUTE ONLY): 24 min  Charges:  $Gait Training: 8-22 mins $Therapeutic Exercise: 8-22 mins                     Brighton Pilley 05/21/2022, 2:44 PM

## 2022-05-21 NOTE — Progress Notes (Signed)
Subjective: 1 Day Post-Op Procedure(s) (LRB): TOTAL KNEE ARTHROPLASTY (Left) Patient reports pain as mild.   Patient seen in rounds by Dr. Wynelle Link. Patient is well, and has had no acute complaints or problems No issues overnight. Denies chest pain, SOB, or calf pain. Foley catheter removed this AM.  We will continue therapy today, ambulated 15' yesterday.   Objective: Vital signs in last 24 hours: Temp:  [97 F (36.1 C)-98.8 F (37.1 C)] 97.9 F (36.6 C) (10/24 0617) Pulse Rate:  [65-95] 86 (10/24 0617) Resp:  [11-23] 18 (10/24 0617) BP: (84-174)/(48-99) 135/95 (10/24 0617) SpO2:  [93 %-100 %] 95 % (10/24 0617) Weight:  [69.4 kg] 69.4 kg (10/23 0936)  Intake/Output from previous day:  Intake/Output Summary (Last 24 hours) at 05/21/2022 0736 Last data filed at 05/21/2022 0600 Gross per 24 hour  Intake 3120.43 ml  Output 2725 ml  Net 395.43 ml     Intake/Output this shift: No intake/output data recorded.  Labs: Recent Labs    05/21/22 0344  HGB 10.8*   Recent Labs    05/21/22 0344  WBC 11.9*  RBC 3.58*  HCT 33.2*  PLT 156   Recent Labs    05/21/22 0344  NA 129*  K 4.1  CL 99  CO2 23  BUN 13  CREATININE 0.48  GLUCOSE 154*  CALCIUM 8.2*   No results for input(s): "LABPT", "INR" in the last 72 hours.  Exam: General - Patient is Alert and Oriented Extremity - Neurologically intact Neurovascular intact Sensation intact distally Dorsiflexion/Plantar flexion intact Dressing - dressing C/D/I Motor Function - intact, moving foot and toes well on exam.   Past Medical History:  Diagnosis Date   Abnormal cardiac CT angiography 04/04/2018   Abnormal CXR 01/21/2018   Arthritis    "all over" (04/08/2018)   CAD S/P percutaneous coronary angioplasty and stent DES mLAD beyond 2nd diag 04/08/2018   Chest pain 01/22/2018   Chronic edema 05/19/2017   Chronic lower back pain    Complication of anesthesia 1989   mouth joint trouble after hysterctomy, told by  dentist to haver anesthesia use bite block to keep mouth open same amount all over   Coronary artery disease    Diverticulosis    Diverticulosis of colon 09/28/2013   Diverticulosis of colon (without mention of hemorrhage) 09/28/2013   DJD (degenerative joint disease)    Essential hypertension 09/18/2015   Last Assessment & Plan:  Relevant Hx: Course: Daily Update: Today's Plan:this is stable for her at this time and will follow and on repeat she was much better overall Electronically signed by: Mayer Camel, NP 09/18/15 1146   Family history of anesthesia complication    brother low bp, heart problems, ileus   GERD (gastroesophageal reflux disease)    High risk medication use 09/18/2015   History of diverticulosis 08/28/2017   Hyperlipidemia    Hyperlipidemia LDL goal <70 09/18/2015   Last Assessment & Plan:  Update her lipids for her fasting   Hyperlipidemia, unspecified 09/18/2015   Last Assessment & Plan:  Update her lipids for her fasting   Hypertension    IBS (irritable bowel syndrome)    Irritable bowel syndrome 09/28/2013   Irritable bowel syndrome with both constipation and diarrhea 09/18/2015   Last Assessment & Plan:  She has more fecal incontinence and she has to wear a pad and that is part of her UTI issues, though the Lucrezia Starch has helped her about 75% to diminish this and for that she  is pleased.   OA (osteoarthritis) of knee 09/27/2013   Osteopenia    Other and unspecified hyperlipidemia 09/28/2013   Pneumonia 1955   Primary osteoarthritis involving multiple joints 09/18/2015   Last Assessment & Plan:  This is stable for her and will follow along   SOB (shortness of breath) 02/11/2018   Squamous carcinoma ~ 2009   "right ear"   TMJ (dislocation of temporomandibular joint)    Unspecified essential hypertension 09/28/2013    Assessment/Plan: 1 Day Post-Op Procedure(s) (LRB): TOTAL KNEE ARTHROPLASTY (Left) Principal Problem:   Osteoarthritis of left  knee  Estimated body mass index is 28.91 kg/m as calculated from the following:   Height as of this encounter: '5\' 1"'$  (1.549 m).   Weight as of this encounter: 69.4 kg. Advance diet Up with therapy D/C IV fluids   Patient's anticipated LOS is less than 2 midnights, meeting these requirements: - Lives within 1 hour of care - Has a competent adult at home to recover with post-op recover - NO history of  - Diabetes  - Heart failure  - Stroke  - DVT/VTE  - Cardiac arrhythmia  - Respiratory Failure/COPD  - Renal failure  - Anemia  - Advanced Liver disease  DVT Prophylaxis - Aspirin and Plavix Weight bearing as tolerated. Continue therapy.  Obtaining EKG per patient's cardiologist (Dr. Bettina Gavia) [place].  Plan is to go Home after hospital stay. Plan for discharge later today if progresses with therapy and meeting goals. Scheduled [facility] OPP[facility] at [facility]). Follow-up in the office in 2 weeks.  The PDMP database was reviewed today prior to any opioid medications being prescribed to this patient.  Theresa Duty, PA-C Orthopedic Surgery [phone] 05/21/2022, 7:36 AM

## 2022-05-21 NOTE — TOC Transition Note (Signed)
Transition of Care [facility]) - CM/SW Discharge Note   Patient Details  Name: Pamela Dominguez MRN: [id number] Date of Birth: Aug 04, 1932  Transition of Care [facility]) CM/SW Contact:  Lennart Pall, LCSW Phone Number: 05/21/2022, 12:09 PM   Clinical Narrative:    Met with pt and confirming she does need a RW/ no DME agency preference - order placed with [employer] and delivered to room.  OPPT already set up with [facility].  No further TOC needs.   Final next level of care: OP Rehab Barriers to Discharge: No Barriers Identified   Patient Goals and CMS Choice Patient states their goals for this hospitalization and ongoing recovery are:: return home      Discharge Placement                       Discharge Plan and Services                DME Arranged: Walker rolling DME Agency: [employer] Date DME Agency Contacted: 05/21/22 Time DME Agency Contacted: [id number] Representative spoke with at DME Agency: [place] ([place]) Interventions     Readmission Risk Interventions     No data to display

## 2022-05-22 NOTE — Discharge Summary (Signed)
Patient ID: Pamela Dominguez MRN: [id number] DOB/AGE: [birth year] 88 y.o.  Admit date: 05/20/2022 Discharge date: 05/21/2022  Admission Diagnoses:  Principal Problem:   Osteoarthritis of left knee   Discharge Diagnoses:  Same  Past Medical History:  Diagnosis Date   Abnormal cardiac CT angiography 04/04/2018   Abnormal CXR 01/21/2018   Arthritis    "all over" (04/08/2018)   CAD S/P percutaneous coronary angioplasty and stent DES mLAD beyond 2nd diag 04/08/2018   Chest pain 01/22/2018   Chronic edema 05/19/2017   Chronic lower back pain    Complication of anesthesia 1989   mouth joint trouble after hysterctomy, told by dentist to haver anesthesia use bite block to keep mouth open same amount all over   Coronary artery disease    Diverticulosis    Diverticulosis of colon 09/28/2013   Diverticulosis of colon (without mention of hemorrhage) 09/28/2013   DJD (degenerative joint disease)    Essential hypertension 09/18/2015   Last Assessment & Plan:  Relevant Hx: Course: Daily Update: Today's Plan:this is stable for her at this time and will follow and on repeat she was much better overall Electronically signed by: Pamela Camel, NP 09/18/15 1146   Family history of anesthesia complication    brother low bp, heart problems, ileus   GERD (gastroesophageal reflux disease)    High risk medication use 09/18/2015   History of diverticulosis 08/28/2017   Hyperlipidemia    Hyperlipidemia LDL goal <70 09/18/2015   Last Assessment & Plan:  Update her lipids for her fasting   Hyperlipidemia, unspecified 09/18/2015   Last Assessment & Plan:  Update her lipids for her fasting   Hypertension    IBS (irritable bowel syndrome)    Irritable bowel syndrome 09/28/2013   Irritable bowel syndrome with both constipation and diarrhea 09/18/2015   Last Assessment & Plan:  She has more fecal incontinence and she has to wear a pad and that is part of her UTI issues, though the Lucrezia Starch has  helped her about 75% to diminish this and for that she is pleased.   OA (osteoarthritis) of knee 09/27/2013   Osteopenia    Other and unspecified hyperlipidemia 09/28/2013   Pneumonia 1955   Primary osteoarthritis involving multiple joints 09/18/2015   Last Assessment & Plan:  This is stable for her and will follow along   SOB (shortness of breath) 02/11/2018   Squamous carcinoma ~ 2009   "right ear"   TMJ (dislocation of temporomandibular joint)    Unspecified essential hypertension 09/28/2013    Surgeries: Procedure(s): TOTAL KNEE ARTHROPLASTY on 05/20/2022   Consultants:   Discharged Condition: Improved  Hospital Course: Pamela Dominguez is an 88 y.o. female who was admitted 05/20/2022 for operative treatment ofOsteoarthritis of left knee. Patient has severe unremitting pain that affects sleep, daily activities, and work/hobbies. After pre-op clearance the patient was taken to the operating room on 05/20/2022 and underwent  Procedure(s): TOTAL KNEE ARTHROPLASTY.    Patient was given perioperative antibiotics:  Anti-infectives (From admission, onward)    Start     Dose/Rate Route Frequency Ordered Stop   05/20/22 1150  clindamycin (CLEOCIN) 900 MG/50ML IVPB       Note to Pharmacy: Harle Stanford R: cabinet override      05/20/22 1150 05/20/22 1156   05/20/22 0915  vancomycin (VANCOCIN) IVPB 1000 mg/200 mL premix  Status:  Discontinued        1,000 mg 200 mL/hr over 60 Minutes Intravenous On call to O.R. 05/20/22  [id number] 05/20/22 0907   05/20/22 0915  ceFAZolin (ANCEF) IVPB 2g/100 mL premix  Status:  Discontinued        2 g 200 mL/hr over 30 Minutes Intravenous  Once 05/20/22 0908 05/20/22 1419        Patient was given sequential compression devices, early ambulation, and chemoprophylaxis to prevent DVT.  Patient benefited maximally from hospital stay and there were no complications.    Recent vital signs: Patient Vitals for the past 24 hrs:  BP Temp Pulse Resp SpO2  05/21/22  1308 (!) 160/80 98 F (36.7 C) 78 18 97 %  05/21/22 1034 (!) 143/84 98.1 F (36.7 C) 88 18 97 %     Recent laboratory studies:  Recent Labs    05/21/22 0344  WBC 11.9*  HGB 10.8*  HCT 33.2*  PLT 156  NA 129*  K 4.1  CL 99  CO2 23  BUN 13  CREATININE 0.48  GLUCOSE 154*  CALCIUM 8.2*     Discharge Medications:   Allergies as of 05/21/2022       Reactions   Penicillins Shortness Of Breath, Swelling   Has patient had a PCN reaction causing immediate rash, facial/tongue/throat swelling, SOB or lightheadedness with hypotension: Yes Has patient had a PCN reaction causing severe rash involving mucus membranes or skin necrosis: No Has patient had a PCN reaction that required hospitalization: No Has patient had a PCN reaction occurring within the last 10 years: No If all of the above answers are "NO", then may proceed with Cephalosporin use.   Actonel [risedronate Sodium] Other (See Comments)   "felt like I was choking"   Alendronate Nausea And Vomiting   Nizatidine Itching, Rash   Axid-Brand Name   Quinine Itching, Rash        Medication List     TAKE these medications    acetaminophen 650 MG CR tablet Commonly known as: TYLENOL Take 650 mg by mouth daily as needed for pain.   aspirin EC 325 MG tablet Take 1 tablet (325 mg total) by mouth daily for 20 days. Then take an 81 mg aspirin once a day for three weeks. Then discontinue aspirin.   atorvastatin 40 MG tablet Commonly known as: LIPITOR Take 1 tablet (40 mg total) by mouth daily. Take 1 tablet by mouth daily at 6 pm   Biotin 10000 MCG Tabs Take 10,000 mcg by mouth daily.   cetirizine 10 MG tablet Commonly known as: ZYRTEC Take 10 mg by mouth daily.   cholestyramine 4 g packet Commonly known as: QUESTRAN Take 4 g by mouth at bedtime.   clopidogrel 75 MG tablet Commonly known as: PLAVIX Take 1 tablet (75 mg total) by mouth daily with breakfast.   famotidine 40 MG tablet Commonly known as:  PEPCID Take 40 mg by mouth daily.   Melatonin 5 MG Caps Take 5 mg by mouth at bedtime.   methocarbamol 500 MG tablet Commonly known as: ROBAXIN Take 1 tablet (500 mg total) by mouth every 6 (six) hours as needed for muscle spasms.   multivitamin tablet Take 1 tablet by mouth daily.   nitroGLYCERIN 0.4 MG SL tablet Commonly known as: NITROSTAT Place 0.4 mg under the tongue every 5 (five) minutes as needed for chest pain.   OCUVITE PO Take 1 tablet by mouth daily.   oxyCODONE 5 MG immediate release tablet Commonly known as: Oxy IR/ROXICODONE Take 1-2 tablets (5-10 mg total) by mouth every 6 (six) hours as needed for severe pain.  pantoprazole 40 MG tablet Commonly known as: PROTONIX Take 1 tablet (40 mg total) by mouth daily.   spironolactone 25 MG tablet Commonly known as: ALDACTONE Take 25 mg by mouth 2 (two) times daily.   SYSTANE OP Place 1 drop into both eyes daily as needed (dry eyes).   traMADol 50 MG tablet Commonly known as: ULTRAM Take 1-2 tablets (50-100 mg total) by mouth every 6 (six) hours as needed for moderate pain. What changed:  how much to take reasons to take this   valsartan 320 MG tablet Commonly known as: DIOVAN Take 1 tablet (320 mg total) by mouth daily.               Discharge Care Instructions  (From admission, onward)           Start     Ordered   05/21/22 0000  Weight bearing as tolerated        05/21/22 0740   05/21/22 0000  Change dressing       Comments: You may remove the bulky bandage (ACE wrap and gauze) two days after surgery. You will have an adhesive waterproof bandage underneath. Leave this in place until your first follow-up appointment.   05/21/22 0740            Diagnostic Studies: No results found.  Disposition: Discharge disposition: 01-Home or Self Care       Discharge Instructions     Call MD / Call 911   Complete by: As directed    If you experience chest pain or shortness of breath,  CALL 911 and be transported to the hospital emergency room.  If you develope a fever above 101 F, pus (white drainage) or increased drainage or redness at the wound, or calf pain, call your surgeon's office.   Change dressing   Complete by: As directed    You may remove the bulky bandage (ACE wrap and gauze) two days after surgery. You will have an adhesive waterproof bandage underneath. Leave this in place until your first follow-up appointment.   Constipation Prevention   Complete by: As directed    Drink plenty of fluids.  Prune juice may be helpful.  You may use a stool softener, such as Colace (over the counter) 100 mg twice a day.  Use MiraLax (over the counter) for constipation as needed.   Diet - low sodium heart healthy   Complete by: As directed    Do not put a pillow under the knee. Place it under the heel.   Complete by: As directed    Driving restrictions   Complete by: As directed    No driving for two weeks   Post-operative opioid taper instructions:   Complete by: As directed    POST-OPERATIVE OPIOID TAPER INSTRUCTIONS: It is important to wean off of your opioid medication as soon as possible. If you do not need pain medication after your surgery it is ok to stop day one. Opioids include: Codeine, Hydrocodone(Norco, Vicodin), Oxycodone(Percocet, oxycontin) and hydromorphone amongst others.  Long term and even short term use of opiods can cause: Increased pain response Dependence Constipation Depression Respiratory depression And more.  Withdrawal symptoms can include Flu like symptoms Nausea, vomiting And more Techniques to manage these symptoms Hydrate well Eat regular healthy meals Stay active Use relaxation techniques(deep breathing, meditating, yoga) Do Not substitute Alcohol to help with tapering If you have been on opioids for less than two weeks and do not have pain than it is  ok to stop all together.  Plan to wean off of opioids This plan should start  within one week post op of your joint replacement. Maintain the same interval or time between taking each dose and first decrease the dose.  Cut the total daily intake of opioids by one tablet each day Next start to increase the time between doses. The last dose that should be eliminated is the evening dose.      TED hose   Complete by: As directed    Use stockings (TED hose) for three weeks on both leg(s).  You may remove them at night for sleeping.   Weight bearing as tolerated   Complete by: As directed         Follow-up Information     Aluisio, Pilar Plate, MD. Schedule an appointment as soon as possible for a visit in 2 week(s).   Specialty: Orthopedic Surgery Contact information: [address] [place] [address] [phone]                  Signed: Theresa Duty 05/22/2022, 7:43 AM

## 2022-06-17 ENCOUNTER — Telehealth: Payer: Self-pay | Admitting: Cardiology

## 2022-06-17 ENCOUNTER — Other Ambulatory Visit: Payer: Self-pay

## 2022-06-17 MED ORDER — ATORVASTATIN CALCIUM 40 MG PO TABS: 40.0000 mg | ORAL_TABLET | Freq: Every day | ORAL | 3 refills | Status: DC

## 2022-06-17 MED ORDER — CLOPIDOGREL BISULFATE 75 MG PO TABS: 75.0000 mg | ORAL_TABLET | Freq: Every day | ORAL | 3 refills | Status: DC

## 2022-06-17 NOTE — Telephone Encounter (Signed)
Called patient and informed her that her medications had been re-filled. Patient was very appreciative and had no further questions at this time.

## 2022-06-17 NOTE — Telephone Encounter (Signed)
*  STAT* If patient is at the pharmacy, call can be transferred to refill team.   1. Which medications need to be refilled? (please list name of each medication and dose if known)  atorvastatin (LIPITOR) 40 MG tablet clopidogrel (PLAVIX) 75 MG tablet  2. Which pharmacy/location (including street and city if local pharmacy) is medication to be sent to? [facility] - [place], [place]   3. Do they need a 30 day or 90 day supply?  90 day supply

## 2022-07-09 ENCOUNTER — Ambulatory Visit (INDEPENDENT_AMBULATORY_CARE_PROVIDER_SITE_OTHER): Payer: PPO

## 2022-07-09 ENCOUNTER — Ambulatory Visit: Payer: PPO | Admitting: Podiatry

## 2022-07-09 DIAGNOSIS — M7752 Other enthesopathy of left foot: Secondary | ICD-10-CM

## 2022-07-09 DIAGNOSIS — M779 Enthesopathy, unspecified: Secondary | ICD-10-CM

## 2022-07-09 MED ORDER — METHYLPREDNISOLONE 4 MG PO TBPK: ORAL_TABLET | ORAL | 0 refills | Status: DC

## 2022-07-09 NOTE — Progress Notes (Signed)
Subjective:  Patient ID: Pamela Dominguez, female    DOB: 07-04-1933,  MRN: [id number]  Chief Complaint  Patient presents with   Foot Problem    LEFT FOOT & ANKLE PAIN / SORENESS MAINLY WHEN WALKING / AT [place]    88 y.o. female presents with concern for pain in the left foot.  She denies any recent injury.  She says she has noticed some pain and swelling for the past 5 days to week primarily in the lateral aspect of the left midfoot but notices the swelling also in the ankle as well.  Denies any twisting of her ankle or fall.  She says the pain is primarily when she is ambulating and she has been using a cane to assist that she recently had left knee surgery almost done with physical therapy for that.  Past Medical History:  Diagnosis Date   Abnormal cardiac CT angiography 04/04/2018   Abnormal CXR 01/21/2018   Arthritis    "all over" (04/08/2018)   CAD S/P percutaneous coronary angioplasty and stent DES mLAD beyond 2nd diag 04/08/2018   Chest pain 01/22/2018   Chronic edema 05/19/2017   Chronic lower back pain    Complication of anesthesia 1989   mouth joint trouble after hysterctomy, told by dentist to haver anesthesia use bite block to keep mouth open same amount all over   Coronary artery disease    Diverticulosis    Diverticulosis of colon 09/28/2013   Diverticulosis of colon (without mention of hemorrhage) 09/28/2013   DJD (degenerative joint disease)    Essential hypertension 09/18/2015   Last Assessment & Plan:  Relevant Hx: Course: Daily Update: Today's Plan:this is stable for her at this time and will follow and on repeat she was much better overall Electronically signed by: Mayer Camel, NP 09/18/15 1146   Family history of anesthesia complication    brother low bp, heart problems, ileus   GERD (gastroesophageal reflux disease)    High risk medication use 09/18/2015   History of diverticulosis 08/28/2017   Hyperlipidemia    Hyperlipidemia LDL goal  <70 09/18/2015   Last Assessment & Plan:  Update her lipids for her fasting   Hyperlipidemia, unspecified 09/18/2015   Last Assessment & Plan:  Update her lipids for her fasting   Hypertension    IBS (irritable bowel syndrome)    Irritable bowel syndrome 09/28/2013   Irritable bowel syndrome with both constipation and diarrhea 09/18/2015   Last Assessment & Plan:  She has more fecal incontinence and she has to wear a pad and that is part of her UTI issues, though the Lucrezia Starch has helped her about 75% to diminish this and for that she is pleased.   OA (osteoarthritis) of knee 09/27/2013   Osteopenia    Other and unspecified hyperlipidemia 09/28/2013   Pneumonia 1955   Primary osteoarthritis involving multiple joints 09/18/2015   Last Assessment & Plan:  This is stable for her and will follow along   SOB (shortness of breath) 02/11/2018   Squamous carcinoma ~ 2009   "right ear"   TMJ (dislocation of temporomandibular joint)    Unspecified essential hypertension 09/28/2013    Allergies  Allergen Reactions   Penicillins Shortness Of Breath and Swelling    Has patient had a PCN reaction causing immediate rash, facial/tongue/throat swelling, SOB or lightheadedness with hypotension: Yes Has patient had a PCN reaction causing severe rash involving mucus membranes or skin necrosis: No Has patient had a PCN reaction that  required hospitalization: No Has patient had a PCN reaction occurring within the last 10 years: No If all of the above answers are "NO", then may proceed with Cephalosporin use.    Actonel [Risedronate Sodium] Other (See Comments)    "felt like I was choking"   Alendronate Nausea And Vomiting   Nizatidine Itching and Rash    Axid-Brand Name   Quinine Itching and Rash    ROS: Negative except as per HPI above  Objective:  General: AAO x3, NAD  Dermatological: With inspection and palpation of the right and left lower extremities there are no open sores, no  preulcerative lesions, no rash or signs of infection present. Nails are of normal length thickness and coloration.   Vascular:  Dorsalis Pedis artery and Posterior Tibial artery pedal pulses are 2/4 bilateral.  Capillary fill time < 3 sec to all digits.   Neruologic: Grossly intact via light touch bilateral. Protective threshold intact to all sites bilateral.   Musculoskeletal: Attention is right to the lateral left midfoot there is noted to be significant edema overlying the extensor digitorum brevis muscle and dorsal to the cuboid.  There is pain with palpation of this area as well.  No pain at the fibula or fifth metatarsal base primarily just slightly proximal and dorsal to that area.  Gait: Antalgic assisted with cane No images are attached to the encounter.  Radiographs:  Date: 07/09/2022 XR the left foot Weightbearing AP/Lateral/Oblique   Findings: No acute fracture identified of the lateral left midfoot including the fifth metatarsal base cuboid or anterior process the calcaneus.  Assessment:   1. Tendinitis of left foot   2. Capsulitis      Plan:  Patient was evaluated and treated and all questions answered.  # Tendinitis of the extensor digitorum longus versus extensor digitorum brevis inflammation/tear -Discussed with the patient that based on the radiographs she does not have any evidence of osseous abnormality to suggest fracture on the left lateral midfoot. -Therefore I believe her issues primarily related to an inflammation of the EDB or EDL tendons. -Rec conservative treatment with methylprednisolone 4 mg steroid Dosepak take as directed for 6 days. -Additionally I would like to immobilize the patient in a short cam boot as she is having difficulty ambulating with regular shoes at this time.  After trying the cam boot on she says she was able to ambulate pain-free.  Want her to wear the cam boot for the next 2 to 3 weeks and then try to get out of the boot before she  comes back in 4 weeks.  Return in about 4 weeks (around 08/06/2022) for Follow-up lateral left midfoot tendinitis.          Everitt Amber, DPM [facility]

## 2022-08-09 ENCOUNTER — Ambulatory Visit: Payer: PPO | Admitting: Podiatry

## 2023-03-10 NOTE — Progress Notes (Deleted)
Cardiology Office Note:    Date:  03/10/2023   ID:  Pamela Dominguez, DOB 1932-08-04, MRN [id number]  PCP:  Gordan Payment., MD  Cardiologist:  Norman Herrlich, MD    Referring MD: Gordan Payment., MD    ASSESSMENT:    1. Coronary artery disease of native artery of native heart with stable angina pectoris (HCC)   2. Essential hypertension   3. Hyperlipidemia LDL goal <70    PLAN:    In order of problems listed above:  ***   Next appointment: ***   Medication Adjustments/Labs and Tests Ordered: Current medicines are reviewed at length with the patient today.  Concerns regarding medicines are outlined above.  No orders of the defined types were placed in this encounter.  No orders of the defined types were placed in this encounter.    History of Present Illness:    Pamela Dominguez is a 89 y.o. female with a hx of CAD with PCI and drug-eluting stent to the LAD 04/08/2018 hypertension hyperlipidemia and lower extremity edema due to calcium channel blocker last seen 04/25/2022. Compliance with diet, lifestyle and medications: *** Past Medical History:  Diagnosis Date   Abnormal cardiac CT angiography 04/04/2018   Abnormal CXR 01/21/2018   Arthritis    "all over" (04/08/2018)   CAD S/P percutaneous coronary angioplasty and stent DES mLAD beyond 2nd diag 04/08/2018   Chest pain 01/22/2018   Chronic edema 05/19/2017   Chronic lower back pain    Complication of anesthesia 1989   mouth joint trouble after hysterctomy, told by dentist to haver anesthesia use bite block to keep mouth open same amount all over   Coronary artery disease    Diverticulosis    Diverticulosis of colon 09/28/2013   Diverticulosis of colon (without mention of hemorrhage) 09/28/2013   DJD (degenerative joint disease)    Essential hypertension 09/18/2015   Last Assessment & Plan:  Relevant Hx: Course: Daily Update: Today's Plan:this is stable for her at this time and will follow and on repeat she was much  better overall Electronically signed by: Krystal Clark, NP 09/18/15 1146   Family history of anesthesia complication    brother low bp, heart problems, ileus   GERD (gastroesophageal reflux disease)    High risk medication use 09/18/2015   History of diverticulosis 08/28/2017   Hyperlipidemia    Hyperlipidemia LDL goal <70 09/18/2015   Last Assessment & Plan:  Update her lipids for her fasting   Hyperlipidemia, unspecified 09/18/2015   Last Assessment & Plan:  Update her lipids for her fasting   Hypertension    IBS (irritable bowel syndrome)    Irritable bowel syndrome 09/28/2013   Irritable bowel syndrome with both constipation and diarrhea 09/18/2015   Last Assessment & Plan:  She has more fecal incontinence and she has to wear a pad and that is part of her UTI issues, though the Lanetta Inch has helped her about 75% to diminish this and for that she is pleased.   OA (osteoarthritis) of knee 09/27/2013   Osteopenia    Other and unspecified hyperlipidemia 09/28/2013   Pneumonia 1955   Primary osteoarthritis involving multiple joints 09/18/2015   Last Assessment & Plan:  This is stable for her and will follow along   SOB (shortness of breath) 02/11/2018   Squamous carcinoma ~ 2009   "right ear"   TMJ (dislocation of temporomandibular joint)    Unspecified essential hypertension 09/28/2013    Current Medications: No outpatient  medications have been marked as taking for the 03/11/23 encounter (Appointment) with Baldo Daub, MD.      EKGs/Labs/Other Studies Reviewed:    The following studies were reviewed today:  Cardiac Studies & Procedures   CARDIAC CATHETERIZATION  CARDIAC CATHETERIZATION 04/08/2018  Narrative Images from the original result were not included.   CULPRIT LESION: Mid LAD lesion is 80% stenosed.  A drug-eluting stent was successfully placed using a STENT SIERRA 2.25 X 15 MM. Postdilated to 2.8 mm.  Post intervention, there is a 0% residual  stenosis.  Otherwise essentially normal coronary arteries  The left ventricular systolic function is normal. LVEF 55-65% by visual estimate.  LV end diastolic pressure is normal.   Severe single-vessel disease involving the mid LAD beyond 2nd Diag = 80% lesion treated with Xience Sierra DES 2.25 mm x 15 mm postdilated to 2.8 mm)  Otherwise essentially normal coronaries with minimal disease.  Normal LV function with EDP  Plan: Unfortunately since we had to switch to femoral access, she will monitor tonight post PCI with sheath removal. Will likely need PRN's for hypertension. Restart post blood pressure medications with exception of beta-blocker tomorrow.  Anticipate discharge tomorrow.  Recommend uninterrupted dual antiplatelet therapy with Aspirin 81mg  daily and Clopidogrel 75mg  daily for a minimum of 6 months (stable ischemic heart disease - Class I recommendation).  Would be able to stop aspirin after 3-6 months.  Without contraindication would recommend continuing clopidogrel for 12 months, but after 6 months could hold for procedures.   Bryan Lemma, M.D., M.S. Interventional Cardiologist  Pager # [phone] Phone # [phone] [address] [place], [place] [address]  Findings Coronary Findings Diagnostic  Dominance: Right  Left Main Vessel is normal in caliber.  Left Anterior Descending Vessel is normal in caliber. Moderate to large caliber vessel that tapers distally after giving rise to the 2nd Diag branch. Mid LAD lesion is 80% stenosed. The lesion is distal to major branch, concentric and irregular.  First Diagonal Branch Vessel is small in size.  First Septal Branch Vessel is small in size.  Second Diagonal Branch Vessel is moderate in size.  Second Septal Branch Vessel is small in size.  Third Septal Branch Vessel is small in size.  Left Circumflex Vessel is normal in caliber. Vessel is angiographically normal.  First Obtuse  Marginal Branch Vessel is small in size. Vessel is angiographically normal.  Left Posterior Atrioventricular Artery Vessel is small in size.  Right Coronary Artery Vessel is small. Small to moderate caliber. Vessel is angiographically normal.  Right Posterior Descending Artery Vessel is small in size.  Right Posterior Atrioventricular Artery Vessel is moderate in size. Vessel is angiographically normal.  Intervention  Mid LAD lesion Stent Lesion length:  12 mm. CATHETER LAUNCHER 6FR JL4 guide catheter was inserted. Lesion crossed with guidewire using a WIRE ASAHI PROWATER 180CM. Pre-stent angioplasty was performed using a BALLOON SAPPHIRE 2.0X12. Maximum pressure:  10 atm. Inflation time:  20 sec. A drug-eluting stent was successfully placed using a STENT SIERRA 2.25 X 15 MM. Maximum pressure: 16 atm. Inflation time: 30 sec. Minimum lumen area:  2.8 mm. Stent strut is well apposed. Post-stent angioplasty was performed using a BALLOON SAPPHIRE Bowers [id number]. Maximum pressure:  16 atm. Inflation time:  20 sec. Post-Intervention Lesion Assessment The intervention was successful. Pre-interventional TIMI flow is 3. Post-intervention TIMI flow is 3. Treated lesion length:  15 mm. No complications occurred at this lesion. There is a 0% residual stenosis post intervention.  STRESS TESTS  MYOCARDIAL PERFUSION IMAGING 03/31/2019  Narrative  The left ventricular ejection fraction is hyperdynamic (>65%).  Nuclear stress EF: 88%.  There was no ST segment deviation noted during stress.  The study is normal.  This is a low risk study.      CT SCANS  CT CORONARY MORPH W/CTA COR W/SCORE 03/27/2018  Addendum 03/28/2018  9:17 AM ADDENDUM REPORT: 03/28/2018 09:14  EXAM: OVER-READ INTERPRETATION CT CHEST  The following report is an over-read performed by radiologist Dr. Hulan Saas of [facility] on 03/28/2018. This over-read does not include interpretation of cardiac or  coronary anatomy or pathology. The coronary CTA interpretation by the cardiologist is attached.  COMPARISON:  CT chest 01/26/2018.  FINDINGS: Vascular: Mild atherosclerosis involving the descending thoracic aorta. No evidence of aortic aneurysm.  Mediastinum/Nodes: No pathologic lymphadenopathy within the visualized mediastinum. Visualized esophagus normal in appearance.  Lungs/Pleura: Scarring associated with dystrophic calcification involving the RIGHT MIDDLE LOBE, unchanged. Minimal scarring in the LEFT LOWER LOBE. Stable 4 mm subpleural nodule laterally in the LEFT LOWER LOBE (series 13, image 24). Visualized lung parenchyma otherwise clear. No pleural effusions. Visualized central airways patent without significant bronchial wall thickening.  Chest Wall: Normal.  Upper Abdomen: Small hiatal hernia containing fat. The stomach remains located below the diaphragm. Interposition of the hepatic flexure of the colon between the liver and the right hemidiaphragm. No significant abnormalities in the visualized upper abdomen allowing for the early arterial phase of enhancement.  Musculoskeletal: Visualized skeleton unremarkable.  IMPRESSION: 1.  Aortic Atherosclerosis, mild.  (ICD10-170.0) 2. No significant extracardiac abnormalities otherwise.   Electronically Signed By: Hulan Saas M.D. On: 03/28/2018 09:14  Narrative CLINICAL DATA:  89 year old female with typical chest pain.  EXAM: Cardiac/Coronary  CT  TECHNIQUE: The patient was scanned on a [employer].  FINDINGS: A 120 kV prospective scan was triggered in the descending thoracic aorta at 111 HU's. Axial non-contrast 3 mm slices were carried out through the heart. The data set was analyzed on a dedicated work station and scored using the Agatson method. Gantry rotation speed was 250 msecs and collimation was .6 mm. 10 mg of iv Metoprolol and 0.8 mg of sl NTG was given. The 3D data set was  reconstructed in 5% intervals of the 67-82 % of the R-R cycle. Diastolic phases were analyzed on a dedicated work station using MPR, MIP and VRT modes. The patient received 80 cc of contrast.  Aorta:  Normal size.  No calcifications.  No dissection.  Aortic Valve:  Trileaflet.  No calcifications.  Coronary Arteries:  Normal coronary origin.  Right dominance.  RCA is a large dominant artery that gives rise to PDA and PLVB. There is mild diffuse plaque with maximum stenosis 25-50%.  Left main is a very short artery that gives rise to LAD, ramus intermedius and has minimal plaque.  LAD is a large vessel that gives rise to two diagonal arteries. There is mild calcified plaque in the proximal portion with stenosis 25-50%, mid LAD has moderate non-calcified plaque with suspicion for a stenosis > 70%.  D1, D2 have minimal plaque.  LCX is a non-dominant artery that gives rise to one OM1 branch. There is mild plaque.  Other findings:  Normal pulmonary vein drainage into the left atrium.  Normal let atrial appendage without a thrombus.  IMPRESSION: 1. Coronary calcium score of 218. This was 60 percentile for age and sex matched control.  2. Normal coronary origin with right dominance.  3. Mild diffuse CAD and suspicion for a severe stenosis in the mid LAD. Additional analysis with CT FFR will be submitted.  Electronically Signed: By: Tobias Alexander On: 03/27/2018 18:34              Recent Labs: 05/21/2022: BUN 13; Creatinine, Ser 0.48; Hemoglobin 10.8; Platelets 156; Potassium 4.1; Sodium 129  Recent Lipid Panel    Component Value Date/Time   CHOL 138 07/01/2019 0957   TRIG 100 07/01/2019 0957   HDL 75 07/01/2019 0957   CHOLHDL 1.8 07/01/2019 0957   LDLCALC 45 07/01/2019 0957    Physical Exam:    VS:  There were no vitals taken for this visit.    Wt Readings from Last 3 Encounters:  05/20/22 153 lb (69.4 kg)  05/07/22 153 lb (69.4 kg)  04/25/22 134 lb  (60.8 kg)     GEN: *** Well nourished, well developed in no acute distress HEENT: Normal NECK: No JVD; No carotid bruits LYMPHATICS: No lymphadenopathy CARDIAC: ***RRR, no murmurs, rubs, gallops RESPIRATORY:  Clear to auscultation without rales, wheezing or rhonchi  ABDOMEN: Soft, non-tender, non-distended MUSCULOSKELETAL:  No edema; No deformity  SKIN: Warm and dry NEUROLOGIC:  Alert and oriented x 3 PSYCHIATRIC:  Normal affect    Signed, Norman Herrlich, MD  03/10/2023 7:54 PM    [facility]

## 2023-03-11 ENCOUNTER — Ambulatory Visit: Payer: PPO | Admitting: Cardiology

## 2023-03-11 DIAGNOSIS — I25118 Atherosclerotic heart disease of native coronary artery with other forms of angina pectoris: Secondary | ICD-10-CM

## 2023-03-11 DIAGNOSIS — E785 Hyperlipidemia, unspecified: Secondary | ICD-10-CM

## 2023-03-11 DIAGNOSIS — I10 Essential (primary) hypertension: Secondary | ICD-10-CM

## 2023-04-14 ENCOUNTER — Telehealth: Payer: Self-pay | Admitting: Cardiology

## 2023-04-14 MED ORDER — CLOPIDOGREL BISULFATE 75 MG PO TABS: 75.0000 mg | ORAL_TABLET | Freq: Every day | ORAL | 3 refills | Status: AC

## 2023-04-14 MED ORDER — ATORVASTATIN CALCIUM 40 MG PO TABS: 40.0000 mg | ORAL_TABLET | Freq: Every day | ORAL | 3 refills | Status: AC

## 2023-04-14 NOTE — Telephone Encounter (Signed)
*  STAT* If patient is at the pharmacy, call can be transferred to refill team.   1. Which medications need to be refilled? (please list name of each medication and dose if known)   atorvastatin (LIPITOR) 40 MG tablet  clopidogrel (PLAVIX) 75 MG tablet   2. Would you like to learn more about the convenience, safety, & potential cost savings by using the [facility]?   3. Are you open to using the [facility] (Type [facility]. ).  4. Which pharmacy/location (including street and city if local pharmacy) is medication to be sent to?  [facility] [employer], [place] - [address]   5. Do they need a 30 day or 90 day supply?   90 day  Patient stated she still has some medication left.  Patient has appointment scheduled on 10/7.

## 2023-04-22 ENCOUNTER — Ambulatory Visit: Payer: PPO | Admitting: Cardiology

## 2023-05-03 NOTE — Progress Notes (Unsigned)
Cardiology Office Note:    Date:  05/05/2023   ID:  Pamela Dominguez, DOB Jan 11, 1933, MRN [id number]  PCP:  Gordan Payment., MD  Cardiologist:  Norman Herrlich, MD    Referring MD: Gordan Payment., MD    ASSESSMENT:    1. Coronary artery disease of native artery of native heart with stable angina pectoris (HCC)   2. Essential hypertension   3. Hyperlipidemia LDL goal <70    PLAN:    In order of problems listed above:  Pamela Dominguez continues to do well with CAD asymptomatic having no John after remote PCI and associated with current medical treatment including long term clopidogrel antihypertensive spironolactone valsartan and her current high intensity statin Will control blood pressure runs 1 30-1 50 at home over 70-80 much higher than my office uncommon I emphasized with her good technique of sitting and resting prior to checking home blood pressure We will go ahead and check a lipid profile liver function for efficacy and safety today continue her high intensity statin Her watch is telling her heart rates are up to 140 bpm with activity I am not sure how valid devices are with exercise and we will utilize a 1 week ZIO monitor to assess her heart rate response if she truly is having heart rates like that she would require low-dose beta-blocker I think it be very unusual in her age group as usually a top predicted heart rate would be in the range of 130 bpm.   Next appointment: 21-month   Medication Adjustments/Labs and Tests Ordered: Current medicines are reviewed at length with the patient today.  Concerns regarding medicines are outlined above.  Orders Placed This Encounter  Procedures   EKG 12-Lead   No orders of the defined types were placed in this encounter.    History of Present Illness:    Pamela Dominguez is a 89 y.o. female with a hx of CAD with PCI and stent drug-eluting LAD September 2019 hypertension hyperlipidemia and lower extremity edema due to calcium channel blocker last  seen 04/25/2022.  Recent labs 10/15/2022 creatinine 0.64 GFR 85 cc potassium 4.6 sodium 136 alkaline phosphatase minimally elevated 112 liver function test otherwise normal hemoglobin A1c normal at 5.8 and lipid profile cholesterol 150 LDL 62 HDL 69 non-HDL cholesterol 81 triglycerides 102 and HDL 69.  Compliance with diet, lifestyle and medications: Yes  She is doing quite well had knee surgery much more active does [facility] [employer] [facility] aerobics and is not having cardiovascular symptoms of edema shortness of breath chest pain palpitation or syncope She tolerates her statin without muscle pain or weakness and clopidogrel without GI upset or bleeding She had a wellness exam today and the decision was to have the labs drawn in my office today check CMP lipids Past Medical History:  Diagnosis Date   Abnormal cardiac CT angiography 04/04/2018   Abnormal CXR 01/21/2018   Arthritis    "all over" (04/08/2018)   CAD S/P percutaneous coronary angioplasty and stent DES mLAD beyond 2nd diag 04/08/2018   Chest pain 01/22/2018   Chronic edema 05/19/2017   Chronic lower back pain    Complication of anesthesia 1989   mouth joint trouble after hysterctomy, told by dentist to haver anesthesia use bite block to keep mouth open same amount all over   Coronary artery disease    Diverticulosis    Diverticulosis of colon 09/28/2013   Diverticulosis of colon (without mention of hemorrhage) 09/28/2013  DJD (degenerative joint disease)    Essential hypertension 09/18/2015   Last Assessment & Plan:  Relevant Hx: Course: Daily Update: Today's Plan:this is stable for her at this time and will follow and on repeat she was much better overall Electronically signed by: Krystal Clark, NP 09/18/15 1146   Family history of anesthesia complication    brother low bp, heart problems, ileus   GERD (gastroesophageal reflux disease)    High risk medication use 09/18/2015   History of  diverticulosis 08/28/2017   Hyperlipidemia    Hyperlipidemia LDL goal <70 09/18/2015   Last Assessment & Plan:  Update her lipids for her fasting   Hyperlipidemia, unspecified 09/18/2015   Last Assessment & Plan:  Update her lipids for her fasting   Hypertension    IBS (irritable bowel syndrome)    Irritable bowel syndrome 09/28/2013   Irritable bowel syndrome with both constipation and diarrhea 09/18/2015   Last Assessment & Plan:  She has more fecal incontinence and she has to wear a pad and that is part of her UTI issues, though the Lanetta Inch has helped her about 75% to diminish this and for that she is pleased.   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   atorvastatin (LIPITOR) 40 MG tablet Take 1 tablet (40 mg total) by mouth daily. Take 1 tablet by mouth daily at 6 pm   Biotin 78295 MCG TABS Take 10,000 mcg by mouth daily.   cetirizine (ZYRTEC) 10 MG tablet Take 10 mg by mouth daily.   cholestyramine (QUESTRAN) 4 G packet Take 4 g by mouth at bedtime.   clopidogrel (PLAVIX) 75 MG tablet Take 1 tablet (75 mg total) by mouth daily with breakfast.   famotidine (PEPCID) 40 MG tablet Take 40 mg by mouth daily.   Melatonin 5 MG CAPS Take 5 mg by mouth at bedtime.   Multiple Vitamin (MULTIVITAMIN) tablet Take 1 tablet by mouth daily.   nitroGLYCERIN (NITROSTAT) 0.4 MG SL tablet Place 0.4 mg under the tongue every 5 (five) minutes as needed for chest pain.    pantoprazole (PROTONIX) 40 MG tablet Take 1 tablet (40 mg total) by mouth daily.   Polyethyl Glycol-Propyl Glycol (SYSTANE OP) Place 1 drop into both eyes daily as needed (dry eyes).   spironolactone (ALDACTONE) 25 MG tablet Take 25 mg by mouth 2 (two) times daily.    traMADol (ULTRAM) 50 MG tablet Take 1-2 tablets (50-100 mg total) by mouth every 6 (six) hours as needed for moderate pain.   valsartan (DIOVAN) 320 MG tablet Take 1 tablet (320 mg total) by mouth daily.      EKGs/Labs/Other Studies Reviewed:    The following studies were reviewed today:  Cardiac Studies & Procedures   CARDIAC CATHETERIZATION  CARDIAC CATHETERIZATION 04/08/2018  Narrative Images from the original result were not included.   CULPRIT LESION: Mid LAD lesion is 80% stenosed.  A drug-eluting stent was successfully placed using  a STENT SIERRA 2.25 X 15 MM. Postdilated to 2.8 mm.  Post intervention, there is a 0% residual stenosis.  Otherwise essentially normal coronary arteries  The left ventricular systolic function is normal. LVEF 55-65% by visual estimate.  LV end diastolic pressure is normal.   Severe single-vessel disease involving the mid LAD beyond 2nd Diag = 80% lesion treated with Xience Sierra DES 2.25 mm x 15 mm postdilated to 2.8 mm)  Otherwise essentially normal coronaries with minimal disease.  Normal LV function with EDP  Plan: Unfortunately since we had to switch to femoral access, she will monitor tonight post PCI with sheath removal. Will likely need PRN's for hypertension. Restart post blood pressure medications with exception of beta-blocker tomorrow.  Anticipate discharge tomorrow.  Recommend uninterrupted dual antiplatelet therapy with Aspirin 81mg  daily and Clopidogrel 75mg  daily for a minimum of 6 months (stable ischemic heart disease - Class I recommendation).  Would be able to stop aspirin after 3-6 months.  Without contraindication would recommend continuing clopidogrel  for 12 months, but after 6 months could hold for procedures.   Bryan Lemma, M.D., M.S. Interventional Cardiologist  Pager # [phone] Phone # [phone] [address] [place], [place] [address]  Findings Coronary Findings Diagnostic  Dominance: Right  Left Main Vessel is normal in caliber.  Left Anterior Descending Vessel is normal in caliber. Moderate to large caliber vessel that tapers distally after giving rise to the 2nd Diag branch. Mid LAD lesion is 80% stenosed. The lesion is distal to major branch, concentric and irregular.  First Diagonal Branch Vessel is small in size.  First Septal Branch Vessel is small in size.  Second Diagonal Branch Vessel is moderate in size.  Second Septal Branch Vessel is small in size.  Third Septal Branch Vessel is small in size.  Left Circumflex Vessel is normal in caliber. Vessel is angiographically normal.  First Obtuse Marginal Branch Vessel is small in size. Vessel is angiographically normal.  Left Posterior Atrioventricular Artery Vessel is small in size.  Right Coronary Artery Vessel is small. Small to moderate caliber. Vessel is angiographically normal.  Right Posterior Descending Artery Vessel is small in size.  Right Posterior Atrioventricular Artery Vessel is moderate in size. Vessel is angiographically normal.  Intervention  Mid LAD lesion Stent Lesion length:  12 mm. CATHETER LAUNCHER 6FR JL4 guide catheter was inserted. Lesion crossed with guidewire using a WIRE ASAHI PROWATER 180CM. Pre-stent angioplasty was performed using a BALLOON SAPPHIRE 2.0X12. Maximum pressure:  10 atm. Inflation time:  20 sec. A drug-eluting stent was successfully placed using a STENT SIERRA 2.25 X 15 MM. Maximum pressure: 16 atm. Inflation time: 30 sec. Minimum lumen area:  2.8 mm. Stent strut is well apposed. Post-stent angioplasty was performed using a BALLOON SAPPHIRE [place] [id number]. Maximum pressure:  16 atm.  Inflation time:  20 sec. Post-Intervention Lesion Assessment The intervention was successful. Pre-interventional TIMI flow is 3. Post-intervention TIMI flow is 3. Treated lesion length:  15 mm. No complications occurred at this lesion. There is a 0% residual stenosis post intervention.   STRESS TESTS  MYOCARDIAL PERFUSION IMAGING 03/31/2019  Narrative  The left ventricular ejection fraction is hyperdynamic (>65%).  Nuclear stress EF: 88%.  There was no ST segment deviation noted during stress.  The study is normal.  This is a low risk study.      CT SCANS  CT CORONARY MORPH W/CTA COR W/SCORE 03/27/2018  Addendum 03/28/2018  9:17 AM ADDENDUM REPORT: 03/28/2018 09:14  EXAM:  OVER-READ INTERPRETATION CT CHEST  The following report is an over-read performed by radiologist Dr. Hulan Saas of [facility] on 03/28/2018. This over-read does not include interpretation of cardiac or coronary anatomy or pathology. The coronary CTA interpretation by the cardiologist is attached.  COMPARISON:  CT chest 01/26/2018.  FINDINGS: Vascular: Mild atherosclerosis involving the descending thoracic aorta. No evidence of aortic aneurysm.  Mediastinum/Nodes: No pathologic lymphadenopathy within the visualized mediastinum. Visualized esophagus normal in appearance.  Lungs/Pleura: Scarring associated with dystrophic calcification involving the RIGHT MIDDLE LOBE, unchanged. Minimal scarring in the LEFT LOWER LOBE. Stable 4 mm subpleural nodule laterally in the LEFT LOWER LOBE (series 13, image 24). Visualized lung parenchyma otherwise clear. No pleural effusions. Visualized central airways patent without significant bronchial wall thickening.  Chest Wall: Normal.  Upper Abdomen: Small hiatal hernia containing fat. The stomach remains located below the diaphragm. Interposition of the hepatic flexure of the colon between the liver and the right hemidiaphragm. No significant  abnormalities in the visualized upper abdomen allowing for the early arterial phase of enhancement.  Musculoskeletal: Visualized skeleton unremarkable.  IMPRESSION: 1.  Aortic Atherosclerosis, mild.  (ICD10-170.0) 2. No significant extracardiac abnormalities otherwise.   Electronically Signed By: Hulan Saas M.D. On: 03/28/2018 09:14  Narrative CLINICAL DATA:  89 year old female with typical chest pain.  EXAM: Cardiac/Coronary  CT  TECHNIQUE: The patient was scanned on a [employer].  FINDINGS: A 120 kV prospective scan was triggered in the descending thoracic aorta at 111 HU's. Axial non-contrast 3 mm slices were carried out through the heart. The data set was analyzed on a dedicated work station and scored using the Agatson method. Gantry rotation speed was 250 msecs and collimation was .6 mm. 10 mg of iv Metoprolol and 0.8 mg of sl NTG was given. The 3D data set was reconstructed in 5% intervals of the 67-82 % of the R-R cycle. Diastolic phases were analyzed on a dedicated work station using MPR, MIP and VRT modes. The patient received 80 cc of contrast.  Aorta:  Normal size.  No calcifications.  No dissection.  Aortic Valve:  Trileaflet.  No calcifications.  Coronary Arteries:  Normal coronary origin.  Right dominance.  RCA is a large dominant artery that gives rise to PDA and PLVB. There is mild diffuse plaque with maximum stenosis 25-50%.  Left main is a very short artery that gives rise to LAD, ramus intermedius and has minimal plaque.  LAD is a large vessel that gives rise to two diagonal arteries. There is mild calcified plaque in the proximal portion with stenosis 25-50%, mid LAD has moderate non-calcified plaque with suspicion for a stenosis > 70%.  D1, D2 have minimal plaque.  LCX is a non-dominant artery that gives rise to one OM1 branch. There is mild plaque.  Other findings:  Normal pulmonary vein drainage into the left  atrium.  Normal let atrial appendage without a thrombus.  IMPRESSION: 1. Coronary calcium score of 218. This was 60 percentile for age and sex matched control.  2. Normal coronary origin with right dominance.  3. Mild diffuse CAD and suspicion for a severe stenosis in the mid LAD. Additional analysis with CT FFR will be submitted.  Electronically Signed: By: Tobias Alexander On: 03/27/2018 18:34          EKG Interpretation Date/Time:  Monday May 05 2023 13:45:07 EDT Ventricular Rate:  94 PR Interval:  166 QRS Duration:  86 QT Interval:  334 QTC Calculation: 417 R  Axis:   83  Text Interpretation: Normal sinus rhythm When compared with ECG of 21-May-2022 13:48, Premature atrial complexes are no longer Present Confirmed by Norman Herrlich ([id number]) on 05/05/2023 1:48:28 PM   Recent Labs: 05/21/2022: BUN 13; Creatinine, Ser 0.48; Hemoglobin 10.8; Platelets 156; Potassium 4.1; Sodium 129     Physical Exam:    VS:  BP 112/60 (BP Location: Left Arm, Patient Position: Sitting, Cuff Size: Normal)   Pulse 94   Ht 5\' 1"  (1.549 m)   Wt 143 lb (64.9 kg)   SpO2 100%   BMI 27.02 kg/m     Wt Readings from Last 3 Encounters:  05/05/23 143 lb (64.9 kg)  05/20/22 153 lb (69.4 kg)  05/07/22 153 lb (69.4 kg)    She appears younger than her age GEN:  Well nourished, well developed in no acute distress HEENT: Normal NECK: No JVD; No carotid bruits LYMPHATICS: No lymphadenopathy CARDIAC: RRR, no murmurs, rubs, gallops RESPIRATORY:  Clear to auscultation without rales, wheezing or rhonchi  ABDOMEN: Soft, non-tender, non-distended MUSCULOSKELETAL:  No edema; No deformity  SKIN: Warm and dry NEUROLOGIC:  Alert and oriented x 3 PSYCHIATRIC:  Normal affect    Signed, Norman Herrlich, MD  05/05/2023 2:00 PM    [facility]

## 2023-05-05 ENCOUNTER — Ambulatory Visit: Payer: PPO | Attending: Cardiology

## 2023-05-05 ENCOUNTER — Encounter: Payer: Self-pay | Admitting: Cardiology

## 2023-05-05 ENCOUNTER — Ambulatory Visit: Payer: PPO | Attending: Cardiology | Admitting: Cardiology

## 2023-05-05 ENCOUNTER — Ambulatory Visit: Payer: PPO | Admitting: Cardiology

## 2023-05-05 VITALS — BP 112/60 | HR 94 | Ht 61.0 in | Wt 143.0 lb

## 2023-05-05 DIAGNOSIS — I10 Essential (primary) hypertension: Secondary | ICD-10-CM

## 2023-05-05 DIAGNOSIS — E785 Hyperlipidemia, unspecified: Secondary | ICD-10-CM

## 2023-05-05 DIAGNOSIS — I25118 Atherosclerotic heart disease of native coronary artery with other forms of angina pectoris: Secondary | ICD-10-CM

## 2023-05-05 NOTE — Patient Instructions (Signed)
Medication Instructions:  Your physician recommends that you continue on your current medications as directed. Please refer to the Current Medication list given to you today.  *If you need a refill on your cardiac medications before your next appointment, please call your pharmacy*   Lab Work: Your physician recommends that you return for lab work in:   Labs today: CMP, Lipids  If you have labs (blood work) drawn today and your tests are completely normal, you will receive your results only by: MyChart Message (if you have MyChart) OR A paper copy in the mail If you have any lab test that is abnormal or we need to change your treatment, we will call you to review the results.   Testing/Procedures: A zio monitor was ordered today. It will remain on for 7 days. You will then return monitor and event diary in provided box. It takes 1-2 weeks for report to be downloaded and returned to [place]. We will call you with the results. If monitor falls off or has orange flashing light, please call Zio for further instructions.     Follow-Up: At [facility], you and your health needs are our priority.  As part of our continuing mission to provide you with exceptional heart care, we have created designated Provider Care Teams.  These Care Teams include your primary Cardiologist (physician) and Advanced Practice Providers (APPs -  Physician Assistants and Nurse Practitioners) who all work together to provide you with the care you need, when you need it.  We recommend signing up for the patient portal called "MyChart".  Sign up information is provided on this After Visit Summary.  MyChart is used to connect with patients for Virtual Visits (Telemedicine).  Patients are able to view lab/test results, encounter notes, upcoming appointments, etc.  Non-urgent messages can be sent to your provider as well.   To learn more about what you can do with MyChart, go to [email or web address].    Your next  appointment:   6 month(s)  Provider:   Norman Herrlich, MD    Other Instructions None

## 2023-05-06 LAB — COMPREHENSIVE METABOLIC PANEL
ALT: 24 [IU]/L (ref 0–32)
AST: 19 [IU]/L (ref 0–40)
Albumin: 4.1 g/dL (ref 3.7–4.7)
Alkaline Phosphatase: 89 [IU]/L (ref 44–121)
BUN/Creatinine Ratio: 17 (ref 12–28)
BUN: 16 mg/dL (ref 8–27)
Bilirubin Total: 0.4 mg/dL (ref 0.0–1.2)
CO2: 22 mmol/L (ref 20–29)
Calcium: 9.5 mg/dL (ref 8.7–10.3)
Chloride: 100 mmol/L (ref 96–106)
Creatinine, Ser: 0.92 mg/dL (ref 0.57–1.00)
Globulin, Total: 2.2 g/dL (ref 1.5–4.5)
Glucose: 151 mg/dL — ABNORMAL HIGH (ref 70–99)
Potassium: 4.2 mmol/L (ref 3.5–5.2)
Sodium: 137 mmol/L (ref 134–144)
Total Protein: 6.3 g/dL (ref 6.0–8.5)
eGFR: 60 mL/min/{1.73_m2} (ref >59)

## 2023-05-06 LAB — LIPID PANEL
Chol/HDL Ratio: 1.9 {ratio} (ref 0.0–4.4)
Cholesterol, Total: 155 mg/dL (ref 100–199)
HDL: 80 mg/dL (ref >39)
LDL Chol Calc (NIH): 60 mg/dL (ref 0–99)
Triglycerides: 79 mg/dL (ref 0–149)
VLDL Cholesterol Cal: 15 mg/dL (ref 5–40)

## 2023-05-07 DIAGNOSIS — E785 Hyperlipidemia, unspecified: Secondary | ICD-10-CM | POA: Diagnosis not present

## 2023-05-07 DIAGNOSIS — I25118 Atherosclerotic heart disease of native coronary artery with other forms of angina pectoris: Secondary | ICD-10-CM | POA: Diagnosis not present

## 2023-05-07 DIAGNOSIS — I10 Essential (primary) hypertension: Secondary | ICD-10-CM

## 2023-05-29 ENCOUNTER — Telehealth: Payer: Self-pay | Admitting: Cardiology

## 2023-05-29 NOTE — Telephone Encounter (Signed)
Left message for the patient to call back.

## 2023-05-29 NOTE — Telephone Encounter (Signed)
Patient is calling to get the results of her heart monitor

## 2023-05-30 NOTE — Telephone Encounter (Signed)
Spoke to patient and message relayed. No questions at this time.     She has brief runs of atrial premature beats but no episodes of atrial fibrillation or flutter.

## 2023-05-30 NOTE — Telephone Encounter (Signed)
Pt returning call, requesting cb. Make sure to call on mobile # per pt

## 2023-11-11 ENCOUNTER — Ambulatory Visit: Payer: PPO | Admitting: Cardiology

## 2023-12-08 ENCOUNTER — Ambulatory Visit

## 2023-12-08 VITALS — BP 126/60 | HR 72 | Ht 61.6 in | Wt 142.8 lb

## 2023-12-08 DIAGNOSIS — I10 Essential (primary) hypertension: Secondary | ICD-10-CM | POA: Diagnosis not present

## 2023-12-08 DIAGNOSIS — R0602 Shortness of breath: Secondary | ICD-10-CM

## 2023-12-08 DIAGNOSIS — I2510 Atherosclerotic heart disease of native coronary artery without angina pectoris: Secondary | ICD-10-CM

## 2023-12-08 DIAGNOSIS — Z9861 Coronary angioplasty status: Secondary | ICD-10-CM

## 2023-12-08 DIAGNOSIS — I491 Atrial premature depolarization: Secondary | ICD-10-CM

## 2023-12-08 DIAGNOSIS — E782 Mixed hyperlipidemia: Secondary | ICD-10-CM | POA: Diagnosis not present

## 2023-12-08 HISTORY — DX: Atrial premature depolarization: I49.1

## 2023-12-08 MED ORDER — METOPROLOL SUCCINATE ER 25 MG PO TB24: 25.0000 mg | ORAL_TABLET | Freq: Every day | ORAL | 3 refills | Status: AC

## 2023-12-08 MED ORDER — NITROGLYCERIN 0.4 MG SL SUBL: 0.4000 mg | SUBLINGUAL_TABLET | SUBLINGUAL | 3 refills | Status: AC | PRN

## 2023-12-08 MED ORDER — SPIRONOLACTONE 25 MG PO TABS: 25.0000 mg | ORAL_TABLET | Freq: Every day | ORAL | 3 refills | Status: AC

## 2023-12-08 NOTE — Patient Instructions (Signed)
 Medication Instructions:  Your physician has recommended you make the following change in your medication:  Reduce Spironolactone  to 25 mg once daily Start taking Metoprolol  Succinate 25 mg once daily  *If you need a refill on your cardiac medications before your next appointment, please call your pharmacy*  Lab Work: NONE If you have labs (blood work) drawn today and your tests are completely normal, you will receive your results only by: MyChart Message (if you have MyChart) OR A paper copy in the mail If you have any lab test that is abnormal or we need to change your treatment, we will call you to review the results.  Testing/Procedures: Your physician has requested that you have an echocardiogram. Echocardiography is a painless test that uses sound waves to create images of your heart. It provides your doctor with information about the size and shape of your heart and how well your heart's chambers and valves are working. This procedure takes approximately one hour. There are no restrictions for this procedure. Please do NOT wear cologne, perfume, aftershave, or lotions (deodorant is allowed). Please arrive 15 minutes prior to your appointment time.  Please note: We ask at that you not bring children with you during ultrasound (echo/ vascular) testing. Due to room size and safety concerns, children are not allowed in the ultrasound rooms during exams. Our front office staff cannot provide observation of children in our lobby area while testing is being conducted. An adult accompanying a patient to their appointment will only be allowed in the ultrasound room at the discretion of the ultrasound technician under special circumstances. We apologize for any inconvenience.   Follow-Up: At [facility], you and your health needs are our priority.  As part of our continuing mission to provide you with exceptional heart care, our providers are all part of one team.  This team includes  your primary Cardiologist (physician) and Advanced Practice Providers or APPs (Physician Assistants and Nurse Practitioners) who all work together to provide you with the care you need, when you need it.  Your next appointment:   6 month(s)  Provider:   Zoe Hinds, MD    We recommend signing up for the patient portal called "MyChart".  Sign up information is provided on this After Visit Summary.  MyChart is used to connect with patients for Virtual Visits (Telemedicine).  Patients are able to view lab/test results, encounter notes, upcoming appointments, etc.  Non-urgent messages can be sent to your provider as well.   To learn more about what you can do with MyChart, go to [email or web address].   Other Instructions

## 2023-12-08 NOTE — Assessment & Plan Note (Signed)
 Well-controlled. Target below 140/90 mmHg. Continue with valsartan  320 mg once daily. Continue spironolactone  lower the dose to 25 mg once daily. Adding metoprolol  succinate 25 mg once daily as reviewed below for frequent atrial ectopy burden.

## 2023-12-08 NOTE — Assessment & Plan Note (Signed)
 Frequent 5% burden. Occasional runs of supraventricular ectopic beats lasting up to 9 beats.  Wonder if this is contributing to her symptoms of occasional shortness of breath with activity.  Will initiate low-dose metoprolol  succinate 25 mg once daily. Will reduce the dose of spironolactone  to avoid hypotension.

## 2023-12-08 NOTE — Assessment & Plan Note (Signed)
 Occasionally noted with exertion.  Not a consistent limiting factor. Likely related to her frequent ectopic beats. Assessment with transthoracic echocardiogram to rule out any significant cardiac structural and functional abnormalities. Does not appear anginal equivalent.

## 2023-12-08 NOTE — Assessment & Plan Note (Addendum)
 Last ischemic workup with stress test in September 2020 that showed no ischemia. Doing well. Good functional capacity.  Mild shortness of breath occasionally, and does not appear to happen every time she is exerting herself physically. Atypical symptoms likely related to increased ectopic beats.  Will start low-dose metoprolol  succinate 25 mg once daily. Titrate down spironolactone  dose to avoid hypotension  Continue with clopidogrel  75 mg once daily. Continue atorvastatin  40 mg once daily.  No prior echocardiogram available to review. With symptoms of shortness of breath reported at times and frequent PACs and occasional PVCs noted will obtain a transthoracic echocardiogram to rule out any significant cardiac structural and functional abnormalities.

## 2023-12-08 NOTE — Progress Notes (Signed)
 Cardiology Consultation:    Date:  12/08/2023   ID:  Pamela Dominguez, DOB Mar 10, 1933, MRN [id number]  PCP:  Pamela Dominguez., MD  Cardiologist:  Pamela Evans Xzandria Clevinger, MD   Referring MD: Pamela Dominguez., MD   No chief complaint on file.    ASSESSMENT AND PLAN:   Pamela Dominguez 89 year old woman history of coronary artery disease obstructive disease noted on cardiac CT August 2019 and subsequently PCI of LAD September 2019 [stress test September 2020 with nuclear imaging no ischemia], hypertension, hyperlipidemia, diverticulosis and has frequent supraventricular ectopy burden 5% and occasional ventricular ectopy burden 1.2% on recent Zio patch monitor from October 2024.  Here for follow-up visit notes occasional shortness of breath with activity.  Problem List Items Addressed This Visit     Essential hypertension (Chronic)   Well-controlled. Target below 140/90 mmHg. Continue with valsartan  320 mg once daily. Continue spironolactone  lower the dose to 25 mg once daily. Adding metoprolol  succinate 25 mg once daily as reviewed below for frequent atrial ectopy burden.      Relevant Medications   spironolactone  (ALDACTONE ) 25 MG tablet   metoprolol  succinate (TOPROL  XL) 25 MG 24 hr tablet   Shortness of breath   Occasionally noted with exertion.  Not a consistent limiting factor. Likely related to her frequent ectopic beats. Assessment with transthoracic echocardiogram to rule out any significant cardiac structural and functional abnormalities. Does not appear anginal equivalent.       CAD S/P percutaneous coronary angioplasty and stent DES mLAD beyond 2nd diag - Primary   Last ischemic workup with stress test in September 2020 that showed no ischemia. Doing well. Good functional capacity.  Mild shortness of breath occasionally, and does not appear to happen every time she is exerting herself physically. Atypical symptoms likely related to increased ectopic beats.  Will start low-dose  metoprolol  succinate 25 mg once daily. Titrate down spironolactone  dose to avoid hypotension  Continue with clopidogrel  75 mg once daily. Continue atorvastatin  40 mg once daily.  No prior echocardiogram available to review. With symptoms of shortness of breath reported at times and frequent PACs and occasional PVCs noted will obtain a transthoracic echocardiogram to rule out any significant cardiac structural and functional abnormalities.       Relevant Medications   spironolactone  (ALDACTONE ) 25 MG tablet   metoprolol  succinate (TOPROL  XL) 25 MG 24 hr tablet   Hyperlipidemia   Relevant Medications   spironolactone  (ALDACTONE ) 25 MG tablet   metoprolol  succinate (TOPROL  XL) 25 MG 24 hr tablet   Premature atrial contraction   Frequent 5% burden. Occasional runs of supraventricular ectopic beats lasting up to 9 beats.  Wonder if this is contributing to her symptoms of occasional shortness of breath with activity.  Will initiate low-dose metoprolol  succinate 25 mg once daily. Will reduce the dose of spironolactone  to avoid hypotension.       Relevant Medications   spironolactone  (ALDACTONE ) 25 MG tablet   metoprolol  succinate (TOPROL  XL) 25 MG 24 hr tablet    Return to clinic tentatively in 6 months.  Earlier follow-up on an as-needed basis.  History of Present Illness:    Pamela Dominguez is a 89 y.o. female who is being seen today for a follow-up visit.  PCP is Pamela Dominguez., MD. Last visit with us  in the office was 05-05-2023 with Dr. Sandee Dominguez.  Has history of coronary artery disease obstructive disease initial workup was for chest pain on exertion and subsequently noted on cardiac  CT August 2019 and subsequently PCI of LAD September 2019 Pamela Dominguez test September 2020 with nuclear imaging no ischemia], hypertension, hyperlipidemia, diverticulosis.  At last office visit with Dr. Sandee Dominguez there was concern about elevated heart rates that she reported at home.  This was followed up  with a Zio patch monitor October 2024 that noted predominantly sinus rhythm with average heart rate 75/min and occasional ventricular ectopy burden 1.2% and isolated supraventricular ectopy burden 5% with occasional runs of supraventricular tachycardia with the longest episode lasting 9 beats.  No pauses or high-grade AV block.  No atrial fibrillation or flutter.   Pleasant woman here for the visit by herself.  Mentions she keeps busy at home with household activities. Walks regularly without any significant limitations but at times does feel a sense of shortness of breath.  This is not consistent.  Denies any chest pain. Denies any falls, syncopal or near syncopal episodes Denies any lightheadedness or dizziness. Denies any pedal edema.  Denies any claudication symptoms  Good compliance with her medications  Blood work from 11/17/2023 noted BUN 17, creatinine 0.58, EGFR 87 normal transaminases and alkaline phosphatase.  Normal electrolytes Hemoglobin A1c 5.7. Lipid panel total cholesterol 126, triglycerides 88, HDL 63, LDL 46, optimal. TSH normal 3.101. CBC unremarkable.  Past Medical History:  Diagnosis Date   Abnormal cardiac CT angiography 04/04/2018   Abnormal CXR 01/21/2018   Angina, class III (HCC) 04/04/2018   Arthritis    "all over" (04/08/2018)   CAD S/P percutaneous coronary angioplasty and stent DES mLAD beyond 2nd diag 04/08/2018   Chest pain 01/22/2018   Chronic edema 05/19/2017   Chronic lower back pain    Complication of anesthesia 1989   mouth joint trouble after hysterctomy, told by dentist to haver anesthesia use bite block to keep mouth open same amount all over   Coronary artery disease    Coronary artery disease of native artery of native heart with stable angina pectoris (HCC)    Degeneration of lumbar intervertebral disc 10/01/2017   Diverticulosis    Diverticulosis of colon 09/28/2013   Diverticulosis of colon (without mention of hemorrhage) 09/28/2013   DJD  (degenerative joint disease)    Essential hypertension 09/18/2015   Last Assessment & Plan:  Relevant Hx: Course: Daily Update: Today's Plan:this is stable for her at this time and will follow and on repeat she was much better overall Electronically signed by: Despina Floro, NP 09/18/15 1146   Family history of anesthesia complication    brother low bp, heart problems, ileus   GERD (gastroesophageal reflux disease)    High risk medication use 09/18/2015   History of diverticulosis 08/28/2017   Hyperlipidemia    Hyperlipidemia LDL goal <70 09/18/2015   Last Assessment & Plan:  Update her lipids for her fasting   Hyperlipidemia, unspecified 09/18/2015   Last Assessment & Plan:  Update her lipids for her fasting   Hypertension    IBS (irritable bowel syndrome)    Irritable bowel syndrome 09/28/2013   Irritable bowel syndrome with both constipation and diarrhea 09/18/2015   Last Assessment & Plan:  She has more fecal incontinence and she has to wear a pad and that is part of her UTI issues, though the questran  has helped her about 75% to diminish this and for that she is pleased.   Lumbar spondylosis 12/17/2017   OA (osteoarthritis) of knee 09/27/2013   Osteoarthritis of left knee 05/20/2022   Osteopenia    Other and unspecified hyperlipidemia 09/28/2013  Pneumonia 1955   Primary osteoarthritis involving multiple joints 09/18/2015   Last Assessment & Plan:  This is stable for her and will follow along   SOB (shortness of breath) 02/11/2018   Stress at home 02/05/2019   TMJ (dislocation of temporomandibular joint)    Weight loss 02/03/2020    Past Surgical History:  Procedure Laterality Date   ABDOMINAL HYSTERECTOMY  1989   APPENDECTOMY  1948   BROW LIFT AND BLEPHAROPLASTY     cataract surgery      bilateral   CHOLECYSTECTOMY  1999   COLONOSCOPY  2013   CORONARY ANGIOPLASTY WITH STENT PLACEMENT  04/08/2018   CORONARY STENT INTERVENTION N/A 04/08/2018   Procedure:  CORONARY STENT INTERVENTION;  Surgeon: Arleen Lacer, MD;  Location: [facility] INVASIVE CV LAB;  Service: Cardiovascular;  Laterality: N/A;   JOINT REPLACEMENT     KNEE ARTHROSCOPY Left 1996   LEFT HEART CATH AND CORONARY ANGIOGRAPHY N/A 04/08/2018   Procedure: LEFT HEART CATH AND CORONARY ANGIOGRAPHY;  Surgeon: Arleen Lacer, MD;  Location: [facility] INVASIVE CV LAB;  Service: Cardiovascular;  Laterality: N/A;   NASAL SINUS SURGERY  1968   SQUAMOUS CELL CARCINOMA EXCISION Right    "ear"   TONSILLECTOMY AND ADENOIDECTOMY  1942   TOTAL KNEE ARTHROPLASTY Right 09/27/2013   Procedure: RIGHT TOTAL KNEE ARTHROPLASTY;  Surgeon: Aurther Blue, MD;  Location: [facility];  Service: Orthopedics;  Laterality: Right;   TOTAL KNEE ARTHROPLASTY Left 05/20/2022   Procedure: TOTAL KNEE ARTHROPLASTY;  Surgeon: Liliane Rei, MD;  Location: [facility];  Service: Orthopedics;  Laterality: Left;    Current Medications: Current Meds  Medication Sig   acetaminophen  (TYLENOL ) 650 MG CR tablet Take 650 mg by mouth daily as needed for pain.   atorvastatin  (LIPITOR) 40 MG tablet Take 1 tablet (40 mg total) by mouth daily. Take 1 tablet by mouth daily at 6 pm   Biotin 16109 MCG TABS Take 10,000 mcg by mouth daily.   cetirizine (ZYRTEC) 10 MG tablet Take 10 mg by mouth daily.   cholestyramine  (QUESTRAN ) 4 G packet Take 4 g by mouth at bedtime.   clopidogrel  (PLAVIX ) 75 MG tablet Take 1 tablet (75 mg total) by mouth daily with breakfast.   famotidine  (PEPCID ) 40 MG tablet Take 40 mg by mouth daily.   Melatonin 5 MG CAPS Take 5 mg by mouth at bedtime.   metoprolol  succinate (TOPROL  XL) 25 MG 24 hr tablet Take 1 tablet (25 mg total) by mouth daily.   Multiple Vitamin (MULTIVITAMIN) tablet Take 1 tablet by mouth daily.   nitroGLYCERIN  (NITROSTAT ) 0.4 MG SL tablet Place 0.4 mg under the tongue every 5 (five) minutes as needed for chest pain.   pantoprazole  (PROTONIX ) 40 MG tablet Take 1 tablet (40 mg total) by mouth daily.    Polyethyl Glycol-Propyl Glycol (SYSTANE OP) Place 1 drop into both eyes daily as needed (dry eyes).   spironolactone  (ALDACTONE ) 25 MG tablet Take 1 tablet (25 mg total) by mouth daily.   traMADol  (ULTRAM ) 50 MG tablet Take 1-2 tablets (50-100 mg total) by mouth every 6 (six) hours as needed for moderate pain.   valsartan  (DIOVAN ) 320 MG tablet Take 1 tablet (320 mg total) by mouth daily.   [DISCONTINUED] spironolactone  (ALDACTONE ) 25 MG tablet Take 25 mg by mouth 2 (two) times daily.      Allergies:   Penicillins, Actonel [risedronate sodium], Alendronate, Nizatidine, and Quinine   Social History   Socioeconomic History   Marital  status: Widowed    Spouse name: Not on file   Number of children: Not on file   Years of education: Not on file   Highest education level: Not on file  Occupational History   Not on file  Tobacco Use   Smoking status: Never   Smokeless tobacco: Never  Vaping Use   Vaping status: Never Used  Substance and Sexual Activity   Alcohol  use: Never   Drug use: Never   Sexual activity: Yes  Other Topics Concern   Not on file  Social History Narrative   Not on file   Social Drivers of Health   Financial Resource Strain: Not on file  Food Insecurity: Low Risk  (11/17/2023)   Received from [facility]   Hunger Vital Sign    Worried About Running Out of Food in the Last Year: Never true    Ran Out of Food in the Last Year: Never true  Transportation Needs: No Transportation Needs (11/17/2023)   Received from [facility]    In the past 12 months, has lack of reliable transportation kept you from medical appointments, meetings, work or from getting things needed for daily living? : No  Physical Activity: Not on file  Stress: Not on file  Social Connections: Not on file     Family History: The patient's family history includes Atrial fibrillation in her brother; Cancer in her father; Hypertension in her mother. ROS:   Please see the  history of present illness.    All 14 point review of systems negative except as described per history of present illness.  EKGs/Labs/Other Studies Reviewed:    The following studies were reviewed today:   EKG:       Recent Labs: 05/05/2023: ALT 24; BUN 16; Creatinine, Ser 0.92; Potassium 4.2; Sodium 137  Recent Lipid Panel    Component Value Date/Time   CHOL 155 05/05/2023 1430   TRIG 79 05/05/2023 1430   HDL 80 05/05/2023 1430   CHOLHDL 1.9 05/05/2023 1430   LDLCALC 60 05/05/2023 1430    Physical Exam:    VS:  BP 126/60   Pulse 72   Ht 5' 1.6" (1.565 m)   Wt 142 lb 12.8 oz (64.8 kg)   SpO2 95%   BMI 26.46 kg/m     Wt Readings from Last 3 Encounters:  12/08/23 142 lb 12.8 oz (64.8 kg)  05/05/23 143 lb (64.9 kg)  05/20/22 153 lb (69.4 kg)     GENERAL:  Well nourished, well developed in no acute distress NECK: No JVD; No carotid bruits CARDIAC: RRR, S1 and S2 present, no murmurs, no rubs, no gallops CHEST:  Clear to auscultation without rales, wheezing or rhonchi  Extremities: No pitting pedal edema. Pulses bilaterally symmetric with radial 2+ and dorsalis pedis 2+ NEUROLOGIC:  Alert and oriented x 3  Medication Adjustments/Labs and Tests Ordered: Current medicines are reviewed at length with the patient today.  Concerns regarding medicines are outlined above.  No orders of the defined types were placed in this encounter.  Meds ordered this encounter  Medications   spironolactone  (ALDACTONE ) 25 MG tablet    Sig: Take 1 tablet (25 mg total) by mouth daily.    Dispense:  90 tablet    Refill:  3   metoprolol  succinate (TOPROL  XL) 25 MG 24 hr tablet    Sig: Take 1 tablet (25 mg total) by mouth daily.    Dispense:  90 tablet    Refill:  3    Signed, Bartlett Enke reddy Saima Monterroso, MD, MPH, [facility]. 12/08/2023 1:55 PM    [facility]

## 2024-01-01 ENCOUNTER — Ambulatory Visit

## 2024-01-01 DIAGNOSIS — R0602 Shortness of breath: Secondary | ICD-10-CM | POA: Diagnosis not present

## 2024-01-01 LAB — ECHOCARDIOGRAM COMPLETE
Area-P 1/2: 4.49 cm2
S' Lateral: 2.3 cm

## 2024-01-08 ENCOUNTER — Telehealth: Payer: Self-pay

## 2024-01-08 NOTE — Telephone Encounter (Signed)
Patient would like a call back to discuss echo results. 

## 2024-01-08 NOTE — Telephone Encounter (Signed)
 Left message for the patient to call back.

## 2024-01-09 NOTE — Telephone Encounter (Signed)
 Left message for the patient to call back.

## 2024-01-12 ENCOUNTER — Ambulatory Visit: Payer: Self-pay

## 2024-01-12 NOTE — Telephone Encounter (Signed)
Pt returning call in regards to results. Please advise 

## 2024-01-12 NOTE — Telephone Encounter (Signed)
 Advised that echo has not been read at this time. Once completed we will call with the result. Pt request we call [phone]

## 2024-01-12 NOTE — Progress Notes (Signed)
 Please inform her echocardiogram results show normal pumping function of the heart with left ventricular ejection fraction 60 to 65%. Stiffness of the heart muscle appears to be mildly increased, this falls within age-appropriate range. There is mild leakiness associated with mitral valve [regurgitation/backward flow], this is not expected to cause any significant symptoms or long-term issues.  We continue to monitor this with repeat echocardiogram tentatively in 2 years.  Overall reassuring results. Thank you

## 2024-01-13 NOTE — Telephone Encounter (Signed)
 LVM for pt with normal ECHO results per Dr. Madireddy's note. Echo to be repeated in 2 years.

## 2024-01-14 NOTE — Telephone Encounter (Signed)
 Left vm to return call on home and cell phone

## 2024-01-14 NOTE — Telephone Encounter (Signed)
-----   Message from [place] R Madireddy sent at 01/12/2024  5:33 PM EDT ----- Please inform her echocardiogram results show normal pumping function of the heart with left ventricular ejection fraction 60 to 65%. Stiffness of the heart muscle appears to be mildly increased, this falls within age-appropriate range. There is mild leakiness associated with mitral valve [regurgitation/backward flow], this is not expected to cause any significant symptoms or long-term issues.  We continue to monitor this with  repeat echocardiogram tentatively in 2 years.  Overall reassuring results. Thank you ----- Message ----- From: Interface, [employer] Sent: 01/01/2024   2:52 PM EDT To: Angelena Kells, MD

## 2024-01-14 NOTE — Telephone Encounter (Signed)
 Results reviewed with pt as per Dr. Madireddy's note.  Pt verbalized understanding and had no additional questions. Routed to PCP

## 2024-03-31 DIAGNOSIS — I10 Essential (primary) hypertension: Secondary | ICD-10-CM | POA: Diagnosis not present

## 2024-03-31 DIAGNOSIS — E785 Hyperlipidemia, unspecified: Secondary | ICD-10-CM | POA: Diagnosis not present

## 2024-03-31 DIAGNOSIS — R079 Chest pain, unspecified: Secondary | ICD-10-CM | POA: Diagnosis not present

## 2024-03-31 DIAGNOSIS — I200 Unstable angina: Secondary | ICD-10-CM | POA: Diagnosis not present

## 2024-03-31 DIAGNOSIS — R001 Bradycardia, unspecified: Secondary | ICD-10-CM | POA: Diagnosis not present

## 2024-04-01 ENCOUNTER — Ambulatory Visit (HOSPITAL_COMMUNITY)
Admission: RE | Admit: 2024-04-01 | Discharge: 2024-04-01 | Disposition: A | Discharge status: Home / Self Care | Attending: Cardiovascular Disease | Admitting: Cardiovascular Disease

## 2024-04-01 ENCOUNTER — Encounter (HOSPITAL_COMMUNITY)
Admission: RE | Disposition: A | Discharge status: Home / Self Care | Payer: Self-pay | Source: Home / Self Care | Attending: Cardiovascular Disease

## 2024-04-01 ENCOUNTER — Telehealth: Payer: Self-pay | Admitting: Cardiology

## 2024-04-01 DIAGNOSIS — R001 Bradycardia, unspecified: Secondary | ICD-10-CM | POA: Diagnosis not present

## 2024-04-01 DIAGNOSIS — Z7902 Long term (current) use of antithrombotics/antiplatelets: Secondary | ICD-10-CM | POA: Insufficient documentation

## 2024-04-01 DIAGNOSIS — I471 Supraventricular tachycardia, unspecified: Secondary | ICD-10-CM | POA: Diagnosis not present

## 2024-04-01 DIAGNOSIS — I493 Ventricular premature depolarization: Secondary | ICD-10-CM | POA: Insufficient documentation

## 2024-04-01 DIAGNOSIS — Z79899 Other long term (current) drug therapy: Secondary | ICD-10-CM | POA: Insufficient documentation

## 2024-04-01 DIAGNOSIS — E785 Hyperlipidemia, unspecified: Secondary | ICD-10-CM | POA: Insufficient documentation

## 2024-04-01 DIAGNOSIS — I2510 Atherosclerotic heart disease of native coronary artery without angina pectoris: Secondary | ICD-10-CM | POA: Diagnosis not present

## 2024-04-01 DIAGNOSIS — I10 Essential (primary) hypertension: Secondary | ICD-10-CM | POA: Insufficient documentation

## 2024-04-01 DIAGNOSIS — R079 Chest pain, unspecified: Secondary | ICD-10-CM | POA: Diagnosis present

## 2024-04-01 DIAGNOSIS — I25118 Atherosclerotic heart disease of native coronary artery with other forms of angina pectoris: Secondary | ICD-10-CM | POA: Insufficient documentation

## 2024-04-01 DIAGNOSIS — I959 Hypotension, unspecified: Secondary | ICD-10-CM | POA: Diagnosis not present

## 2024-04-01 DIAGNOSIS — Z955 Presence of coronary angioplasty implant and graft: Secondary | ICD-10-CM | POA: Diagnosis not present

## 2024-04-01 DIAGNOSIS — Z743 Need for continuous supervision: Secondary | ICD-10-CM | POA: Diagnosis not present

## 2024-04-01 DIAGNOSIS — I200 Unstable angina: Secondary | ICD-10-CM | POA: Diagnosis not present

## 2024-04-01 DIAGNOSIS — R0902 Hypoxemia: Secondary | ICD-10-CM | POA: Diagnosis not present

## 2024-04-01 HISTORY — PX: LEFT HEART CATH AND CORONARY ANGIOGRAPHY: CATH118249

## 2024-04-01 SURGERY — LEFT HEART CATH AND CORONARY ANGIOGRAPHY
Anesthesia: LOCAL

## 2024-04-01 MED ORDER — MIDAZOLAM HCL 2 MG/2ML IJ SOLN
INTRAMUSCULAR | Status: AC
  Filled 2024-04-01: qty 2

## 2024-04-01 MED ORDER — METOPROLOL SUCCINATE ER 25 MG PO TB24: 25.0000 mg | ORAL_TABLET | Freq: Every evening | ORAL | Status: DC

## 2024-04-01 MED ORDER — HEPARIN SODIUM (PORCINE) 1000 UNIT/ML IJ SOLN
INTRAMUSCULAR | Status: AC
Start: 2024-04-01 — End: 2024-04-01
  Filled 2024-04-01: qty 10

## 2024-04-01 MED ORDER — CHOLESTYRAMINE 4 G PO PACK: 4.0000 g | PACK | Freq: Every day | ORAL | Status: DC

## 2024-04-01 MED ORDER — FREE WATER: 500.0000 mL | Freq: Once | Status: DC

## 2024-04-01 MED ORDER — LIDOCAINE HCL (PF) 1 % IJ SOLN
INTRAMUSCULAR | Status: DC | PRN
Start: 2024-04-01 — End: 2024-04-01
  Administered 2024-04-01: 2 mL via INTRADERMAL

## 2024-04-01 MED ORDER — ATORVASTATIN CALCIUM 40 MG PO TABS: 40.0000 mg | ORAL_TABLET | Freq: Every day | ORAL | Status: DC

## 2024-04-01 MED ORDER — ACETAMINOPHEN 325 MG PO TABS: 650.0000 mg | ORAL_TABLET | ORAL | Status: DC | PRN

## 2024-04-01 MED ORDER — IRBESARTAN 150 MG PO TABS: 150.0000 mg | ORAL_TABLET | Freq: Every day | ORAL | Status: DC

## 2024-04-01 MED ORDER — VERAPAMIL HCL 2.5 MG/ML IV SOLN
INTRAVENOUS | Status: DC | PRN
  Administered 2024-04-01: 10 mL via INTRA_ARTERIAL

## 2024-04-01 MED ORDER — LIDOCAINE HCL (PF) 1 % IJ SOLN
INTRAMUSCULAR | Status: AC
Start: 2024-04-01 — End: 2024-04-01
  Filled 2024-04-01: qty 30

## 2024-04-01 MED ORDER — ONDANSETRON HCL 4 MG/2ML IJ SOLN: 4.0000 mg | Freq: Four times a day (QID) | INTRAMUSCULAR | Status: DC | PRN

## 2024-04-01 MED ORDER — LORATADINE 10 MG PO TABS: 10.0000 mg | ORAL_TABLET | Freq: Every day | ORAL | Status: DC

## 2024-04-01 MED ORDER — NITROGLYCERIN 0.4 MG SL SUBL: 0.4000 mg | SUBLINGUAL_TABLET | SUBLINGUAL | Status: DC | PRN

## 2024-04-01 MED ORDER — SODIUM CHLORIDE 0.9% FLUSH: 3.0000 mL | INTRAVENOUS | Status: DC | PRN

## 2024-04-01 MED ORDER — SODIUM CHLORIDE 0.9 % IV SOLN: 250.0000 mL | INTRAVENOUS | Status: DC | PRN

## 2024-04-01 MED ORDER — HYDRALAZINE HCL 20 MG/ML IJ SOLN: 10.0000 mg | INTRAMUSCULAR | Status: DC | PRN

## 2024-04-01 MED ORDER — FENTANYL CITRATE (PF) 100 MCG/2ML IJ SOLN
INTRAMUSCULAR | Status: DC | PRN
  Administered 2024-04-01: 25 ug via INTRAVENOUS

## 2024-04-01 MED ORDER — SODIUM CHLORIDE 0.9% FLUSH: 3.0000 mL | Freq: Two times a day (BID) | INTRAVENOUS | Status: DC

## 2024-04-01 MED ORDER — PANTOPRAZOLE SODIUM 40 MG PO TBEC: 40.0000 mg | DELAYED_RELEASE_TABLET | Freq: Every day | ORAL | Status: DC

## 2024-04-01 MED ORDER — VERAPAMIL HCL 2.5 MG/ML IV SOLN
INTRAVENOUS | Status: AC
Start: 2024-04-01 — End: 2024-04-01
  Filled 2024-04-01: qty 2

## 2024-04-01 MED ORDER — MIDAZOLAM HCL 2 MG/2ML IJ SOLN
INTRAMUSCULAR | Status: DC | PRN
Start: 2024-04-01 — End: 2024-04-01
  Administered 2024-04-01: 1 mg via INTRAVENOUS

## 2024-04-01 MED ORDER — IOHEXOL 350 MG/ML SOLN
INTRAVENOUS | Status: DC | PRN
  Administered 2024-04-01: 45 mL

## 2024-04-01 MED ORDER — FENTANYL CITRATE (PF) 100 MCG/2ML IJ SOLN
INTRAMUSCULAR | Status: AC
  Filled 2024-04-01: qty 2

## 2024-04-01 MED ORDER — HEPARIN (PORCINE) IN NACL 1000-0.9 UT/500ML-% IV SOLN
INTRAVENOUS | Status: DC | PRN
  Administered 2024-04-01 (×2): 500 mL

## 2024-04-01 MED ORDER — LABETALOL HCL 5 MG/ML IV SOLN: 10.0000 mg | INTRAVENOUS | Status: DC | PRN

## 2024-04-01 MED ORDER — ASPIRIN 81 MG PO CHEW: 81.0000 mg | CHEWABLE_TABLET | ORAL | Status: DC

## 2024-04-01 MED ORDER — FAMOTIDINE 20 MG PO TABS: 40.0000 mg | ORAL_TABLET | Freq: Every evening | ORAL | Status: DC

## 2024-04-01 MED ORDER — HEPARIN SODIUM (PORCINE) 1000 UNIT/ML IJ SOLN
INTRAMUSCULAR | Status: DC | PRN
  Administered 2024-04-01: 3000 [IU] via INTRAVENOUS

## 2024-04-01 SURGICAL SUPPLY — 8 items
CATH INFINITI 5FR MULTPACK ANG (CATHETERS) IMPLANT
DEVICE RAD COMP TR BAND LRG (VASCULAR PRODUCTS) IMPLANT
ELECT DEFIB PAD ADLT CADENCE (PAD) IMPLANT
GLIDESHEATH SLEND SS 6F .021 (SHEATH) IMPLANT
GUIDEWIRE INQWIRE 1.5J.035X260 (WIRE) IMPLANT
PACK CARDIAC CATHETERIZATION (CUSTOM PROCEDURE TRAY) ×1 IMPLANT
SET ATX-X65L (MISCELLANEOUS) IMPLANT
WIRE HI TORQ VERSACORE-J 145CM (WIRE) IMPLANT

## 2024-04-01 NOTE — H&P (Addendum)
 Cardiology Admission History and Physical   Patient ID: Pamela Dominguez MRN: [id number]; DOB: 04/06/1933   Admission date: 04/01/2024  PCP:  Pamela Dominguez LABOR., MD   [facility] Providers Cardiologist:  Pamela Leiter, MD     Chief Complaint:  chest pain  Patient Profile: Pamela Dominguez is a 89 y.o. female with CAD, hypertension, hyperlipidemia, and PACs/PVCs who is being seen 04/01/2024 for the evaluation of chest pain.  History of Present Illness: [name] has a history of CAD noted on cardiac CT 03/2018 with subsequent PCI-LAD 03/2018.  Nuclear stress test September 2020 with no ischemia.  Heart monitor 04/2023 with primarily normal sinus rhythm, short runs of SVT, PVCs.  Echocardiogram 12/2023 showed preserved LVEF 60 ICC 5%, normal RV, mildly elevated PASP, mild LAE, mild MR.   She presented to [facility]/30/2024 with chest pain concerning for angina.  She describes chest tightness that was worse with exercise.  She presented after experiencing chest pain around midnight that radiated to her left axilla and left neck.  She reported chest pain also woke her from sleep at 5 AM.  She reported the chest pain was similar to chest pain prior to her PCI.  Workup notable for troponin x 3 negative.  EKG did not appear ischemic although sinus bradycardia with heart rate 53.  While stress echocardiogram and nuclear stress test were discussed, given her known prior disease decision was made to transport to [facility] for definitive angiography.  During my exam, she reports stuttering chest pain all weekend, woke her from sleep on the night prior to presentation. She forgot to trial NTG. CP was similar in quality but less severe, rated as a 5/10. No associated symptoms but radiation to her left neck and left axilla.   Past Medical History:  Diagnosis Date   Abnormal cardiac CT angiography 04/04/2018   Abnormal CXR 01/21/2018   Angina, class III (HCC) 04/04/2018   Arthritis     all over (04/08/2018)   CAD S/P percutaneous coronary angioplasty and stent DES mLAD beyond 2nd diag 04/08/2018   Chest pain 01/22/2018   Chronic edema 05/19/2017   Chronic lower back pain    Complication of anesthesia 1989   mouth joint trouble after hysterctomy, told by dentist to haver anesthesia use bite block to keep mouth open same amount all over   Coronary artery disease    Coronary artery disease of native artery of native heart with stable angina pectoris (HCC)    Degeneration of lumbar intervertebral disc 10/01/2017   Diverticulosis    Diverticulosis of colon 09/28/2013   Diverticulosis of colon (without mention of hemorrhage) 09/28/2013   DJD (degenerative joint disease)    Essential hypertension 09/18/2015   Last Assessment & Plan:  Relevant Hx: Course: Daily Update: Today's Plan:this is stable for her at this time and will follow and on repeat she was much better overall Electronically signed by: Pamela Merlynn Lady, NP 09/18/15 1146   Family history of anesthesia complication    brother low bp, heart problems, ileus   GERD (gastroesophageal reflux disease)    High risk medication use 09/18/2015   History of diverticulosis 08/28/2017   Hyperlipidemia    Hyperlipidemia LDL goal <70 09/18/2015   Last Assessment & Plan:  Update her lipids for her fasting   Hyperlipidemia, unspecified 09/18/2015   Last Assessment & Plan:  Update her lipids for her fasting   Hypertension    IBS (irritable bowel syndrome)  Irritable bowel syndrome 09/28/2013   Irritable bowel syndrome with both constipation and diarrhea 09/18/2015   Last Assessment & Plan:  She has more fecal incontinence and she has to wear a pad and that is part of her UTI issues, though the questran  has helped her about 75% to diminish this and for that she is pleased.   Lumbar spondylosis 12/17/2017   OA (osteoarthritis) of knee 09/27/2013   Osteoarthritis of left knee 05/20/2022   Osteopenia    Other and  unspecified hyperlipidemia 09/28/2013   Pneumonia 1955   Primary osteoarthritis involving multiple joints 09/18/2015   Last Assessment & Plan:  This is stable for her and will follow along   SOB (shortness of breath) 02/11/2018   Stress at home 02/05/2019   TMJ (dislocation of temporomandibular joint)    Weight loss 02/03/2020   Past Surgical History:  Procedure Laterality Date   ABDOMINAL HYSTERECTOMY  1989   APPENDECTOMY  1948   BROW LIFT AND BLEPHAROPLASTY     cataract surgery      bilateral   CHOLECYSTECTOMY  1999   COLONOSCOPY  2013   CORONARY ANGIOPLASTY WITH STENT PLACEMENT  04/08/2018   CORONARY STENT INTERVENTION N/A 04/08/2018   Procedure: CORONARY STENT INTERVENTION;  Surgeon: Pamela Alm ORN, MD;  Location: [facility] INVASIVE CV LAB;  Service: Cardiovascular;  Laterality: N/A;   JOINT REPLACEMENT     KNEE ARTHROSCOPY Left 1996   LEFT HEART CATH AND CORONARY ANGIOGRAPHY N/A 04/08/2018   Procedure: LEFT HEART CATH AND CORONARY ANGIOGRAPHY;  Surgeon: Pamela Alm ORN, MD;  Location: [facility] INVASIVE CV LAB;  Service: Cardiovascular;  Laterality: N/A;   NASAL SINUS SURGERY  1968   SQUAMOUS CELL CARCINOMA EXCISION Right    ear   TONSILLECTOMY AND ADENOIDECTOMY  1942   TOTAL KNEE ARTHROPLASTY Right 09/27/2013   Procedure: RIGHT TOTAL KNEE ARTHROPLASTY;  Surgeon: Pamela LULLA Moan, MD;  Location: [facility] ORS;  Service: Orthopedics;  Laterality: Right;   TOTAL KNEE ARTHROPLASTY Left 05/20/2022   Procedure: TOTAL KNEE ARTHROPLASTY;  Surgeon: Dominguez Dempsey, MD;  Location: [facility] ORS;  Service: Orthopedics;  Laterality: Left;     Medications Prior to Admission: Prior to Admission medications   Medication Sig Start Date End Date Taking? Authorizing Provider  acetaminophen  (TYLENOL ) 650 MG CR tablet Take 650 mg by mouth in the morning.   Yes [provider]  atorvastatin  (LIPITOR) 40 MG tablet Take 1 tablet (40 mg total) by mouth daily. Take 1 tablet by mouth daily at 6 pm 04/14/23  Yes  Monetta Pamela PARAS, MD  cetirizine (ZYRTEC) 10 MG tablet Take 10 mg by mouth every evening.   Yes [provider]  cholestyramine  (QUESTRAN ) 4 G packet Take 4 g by mouth at bedtime.   Yes [provider]  clopidogrel  (PLAVIX ) 75 MG tablet Take 1 tablet (75 mg total) by mouth daily with breakfast. 04/14/23  Yes Monetta Pamela PARAS, MD  famotidine  (PEPCID ) 40 MG tablet Take 40 mg by mouth every evening. 10/23/19  Yes [provider]  Melatonin 5 MG CAPS Take 5 mg by mouth at bedtime as needed (sleep).   Yes [provider]  metoprolol  succinate (TOPROL  XL) 25 MG 24 hr tablet Take 1 tablet (25 mg total) by mouth daily. Patient taking differently: Take 25 mg by mouth every evening. 12/08/23  Yes Madireddy, Alean SAUNDERS, MD  Multiple Vitamin (MULTIVITAMIN) tablet Take 1 tablet by mouth daily.   Yes [provider]  nitroGLYCERIN  (NITROSTAT ) 0.4 MG  SL tablet Place 1 tablet (0.4 mg total) under the tongue every 5 (five) minutes as needed for chest pain. 12/08/23  Yes Madireddy, Alean SAUNDERS, MD  pantoprazole  (PROTONIX ) 40 MG tablet Take 1 tablet (40 mg total) by mouth daily. 04/10/18  Yes Marylu Leita SAUNDERS, NP  Polyethyl Glycol-Propyl Glycol (SYSTANE OP) Place 1 drop into both eyes daily as needed (dry eyes).   Yes [provider]  spironolactone  (ALDACTONE ) 25 MG tablet Take 1 tablet (25 mg total) by mouth daily. 12/08/23 03/31/24 Yes Madireddy, Alean SAUNDERS, MD  traMADol  (ULTRAM ) 50 MG tablet Take 1-2 tablets (50-100 mg total) by mouth every 6 (six) hours as needed for moderate pain. 05/21/22  Yes Edmisten, Kristie L, PA  valsartan  (DIOVAN ) 320 MG tablet Take 1 tablet (320 mg total) by mouth daily. 11/29/19  Yes Monetta Pamela PARAS, MD     Allergies:    Allergies  Allergen Reactions   Penicillins Shortness Of Breath and Swelling    Has patient had a PCN reaction causing immediate rash, facial/tongue/throat swelling, SOB or lightheadedness with hypotension: Yes Has patient  had a PCN reaction causing severe rash involving mucus membranes or skin necrosis: No Has patient had a PCN reaction that required hospitalization: No Has patient had a PCN reaction occurring within the last 10 years: No If all of the above answers are NO, then may proceed with Cephalosporin use.    Actonel [Risedronate Sodium] Other (See Comments)    felt like I was choking   Alendronate Nausea And Vomiting   Nizatidine Itching and Rash    Axid-Brand Name   Quinine Itching and Rash    Social History:   Social History   Socioeconomic History   Marital status: Widowed    Spouse name: Not on file   Number of children: Not on file   Years of education: Not on file   Highest education level: Not on file  Occupational History   Not on file  Tobacco Use   Smoking status: Never   Smokeless tobacco: Never  Vaping Use   Vaping status: Never Used  Substance and Sexual Activity   Alcohol  use: Never   Drug use: Never   Sexual activity: Yes  Other Topics Concern   Not on file  Social History Narrative   Not on file   Social Drivers of Health   Financial Resource Strain: Not on file  Food Insecurity: Low Risk  (11/17/2023)   Received from [facility]   Hunger Vital Sign    Within the past 12 months, you worried that your food would run out before you got money to buy more: Never true    Within the past 12 months, the food you bought just didn't last and you didn't have money to get more. : Never true  Transportation Needs: No Transportation Needs (11/17/2023)   Received from [facility]    In the past 12 months, has lack of reliable transportation kept you from medical appointments, meetings, work or from getting things needed for daily living? : No  Physical Activity: Not on file  Stress: Not on file  Social Connections: Not on file  Intimate Partner Violence: Not At Risk (05/20/2022)   Humiliation, Afraid, Rape, and Kick questionnaire    Fear of  Current or Ex-Partner: No    Emotionally Abused: No    Physically Abused: No    Sexually Abused: No     Family History:   The patient's family history  includes Atrial fibrillation in her brother; Cancer in her father; Hypertension in her mother.    ROS:  Please see the history of present illness.  All other ROS reviewed and negative.     Physical Exam/Data: Vitals:   04/01/24 1215 04/01/24 1230 04/01/24 1245 04/01/24 1256  BP: (!) 126/96 (!) 118/58 117/61   Pulse: 60 (!) 52 60   Resp: 17 18 (!) 22   SpO2: 94% 94% 95%   Weight:    65 kg  Height:    5' 1.61 (1.565 m)   No intake or output data in the 24 hours ending 04/01/24 1305    04/01/2024   12:56 PM 12/08/2023    1:25 PM 05/05/2023    1:42 PM  Last 3 Weights  Weight (lbs) 143 lb 4.8 oz 142 lb 12.8 oz 143 lb  Weight (kg) 65 kg 64.774 kg 64.864 kg     Body mass index is 26.54 kg/m.  General:  appears younger than stated age, NAD HEENT: normal Neck: no JVD Vascular: No carotid bruits; Distal pulses 2+ bilaterally   Cardiac:  normal S1, S2; RRR; no murmur  Lungs:  clear to auscultation bilaterally, no wheezing, rhonchi or rales  Abd: soft, nontender, no hepatomegaly  Ext: no edema Musculoskeletal:  No deformities, BUE and BLE strength normal and equal Skin: warm and dry  Neuro:  CNs 2-12 intact, no focal abnormalities noted Psych:  Normal affect   EKG:  The ECG that was done  was personally reviewed and demonstrates sinus bradycardia with HR 53  Relevant CV Studies:  LHC 04/08/2018: CULPRIT LESION: Mid LAD lesion is 80% stenosed. A drug-eluting stent was successfully placed using a STENT SIERRA 2.25 X 15 MM. Postdilated to 2.8 mm. Post intervention, there is a 0% residual stenosis. Otherwise essentially normal coronary arteries The left ventricular systolic function is normal. LVEF 55-65% by visual estimate. LV end diastolic pressure is normal.   Severe single-vessel disease involving the mid LAD beyond 2nd Diag  = 80% lesion treated with Xience Sierra DES 2.25 mm x 15 mm postdilated to 2.8 mm) Otherwise essentially normal coronaries with minimal disease. Normal LV function with EDP   Plan: Unfortunately since we had to switch to femoral access, she will monitor tonight post PCI with sheath removal. Will likely need PRN's for hypertension. Restart post blood pressure medications with exception of beta-blocker tomorrow.   Anticipate discharge tomorrow.   Recommend uninterrupted dual antiplatelet therapy with Aspirin  81mg  daily and Clopidogrel  75mg  daily for a minimum of 6 months (stable ischemic heart disease - Class I recommendation).  Would be able to stop aspirin  after 3-6 months.  Without contraindication would recommend continuing clopidogrel  for 12 months, but after 6 months could hold for procedures.  Laboratory Data: High Sensitivity Troponin:  No results for input(s): TROPONINIHS in the last 720 hours.    ChemistryNo results for input(s): NA, K, CL, CO2, GLUCOSE, BUN, CREATININE, CALCIUM , MG, GFRNONAA, GFRAA, ANIONGAP in the last 168 hours.  No results for input(s): PROT, ALBUMIN, AST, ALT, ALKPHOS, BILITOT in the last 168 hours. Lipids No results for input(s): CHOL, TRIG, HDL, LABVLDL, LDLCALC, CHOLHDL in the last 168 hours. HematologyNo results for input(s): WBC, RBC, HGB, HCT, MCV, MCH, MCHC, RDW, PLT in the last 168 hours. Thyroid  No results for input(s): TSH, FREET4 in the last 168 hours. BNPNo results for input(s): BNP, PROBNP in the last 168 hours.  DDimer No results for input(s): DDIMER in the last 168 hours.  Radiology/Studies:  No results found.   Assessment and Plan:  Chest pain - troponin x 3 negative - EKG with sinus bradycardia - given symptoms concerning for unstable angina, will plan for definitive angiography   CAD - DES-mid LAD 03/2018 using 2.25 x 15 mm - remains on plavix , 40 mg  lipitor, 25 mg toprol    Hypertension - 320 mg of valsartan , 25 mg spironolactone , Toprol  25 mg daily   PAC/PVCs -25 mg Toprol    Hyperlipidemia with LDL goal < 70 05/05/2023: Cholesterol, Total 155; HDL 80; LDL Chol Calc (NIH) 60; Triglycerides 79 Continue statin  Risk Assessment/Risk Scores:   TIMI Risk Score for Unstable Angina or Non-ST Elevation MI:   The patient's TIMI risk score is 4, which indicates a 20% risk of all cause mortality, new or recurrent myocardial infarction or need for urgent revascularization in the next 14 days.   Code Status: Full Code  Severity of Illness: The appropriate patient status for this patient is INPATIENT. Inpatient status is judged to be reasonable and necessary in order to provide the required intensity of service to ensure the patient's safety. The patient's presenting symptoms, physical exam findings, and initial radiographic and laboratory data in the context of their chronic comorbidities is felt to place them at high risk for further clinical deterioration. Furthermore, it is not anticipated that the patient will be medically stable for discharge from the hospital within 2 midnights of admission.   * I certify that at the point of admission it is my clinical judgment that the patient will require inpatient hospital care spanning beyond 2 midnights from the point of admission due to high intensity of service, high risk for further deterioration and high frequency of surveillance required.*  For questions or updates, please contact [facility] Please consult [email or web address] for contact info under     Signed, Jon Nat Hails, PA  04/01/2024 1:05 PM   I have personally seen and examined this patient. I agree with the assessment and plan as outlined above.  89 yo female with known CAD with prior stenting of the LAD in 2019 admitted to [facility] with c/o chest pain felt to be consistent with unstable angina. Troponin negative.  Pt transferred to [facility] for cardiac cath.  Labs reviewed by me EKG reviewed by me Tele with sinus NAD  RRR Plan: Cardiac cath today.   Lonni Cash, MD, [facility] 04/01/2024 1:08 PM

## 2024-04-01 NOTE — Discharge Instructions (Signed)
 Radial Site Care  This sheet gives you information about how to care for yourself after your procedure. Your health care provider may also give you more specific instructions. If you have problems or questions, contact your health care provider. What can I expect after the procedure? After the procedure, it is common to have: Bruising and tenderness at the catheter insertion area. Follow these instructions at home: Medicines Take over-the-counter and prescription medicines only as told by your health care provider. Insertion site care Follow instructions from your health care provider about how to take care of your insertion site. Make sure you: Wash your hands with soap and water before you remove your bandage (dressing). If soap and water are not available, use hand sanitizer. May remove dressing in 24 hours. Check your insertion site every day for signs of infection. Check for: Redness, swelling, or pain. Fluid or blood. Pus or a bad smell. Warmth. Do no take baths, swim, or use a hot tub for 5 days. You may shower 24-48 hours after the procedure. Remove the dressing and gently wash the site with plain soap and water. Pat the area dry with a clean towel. Do not rub the site. That could cause bleeding. Do not apply powder or lotion to the site. Activity  For 24 hours after the procedure, or as directed by your health care provider: Do not flex or bend the affected arm. Do not push or pull heavy objects with the affected arm. Do not drive yourself home from the hospital or clinic. You may drive 24 hours after the procedure. Do not operate machinery or power tools. KEEP ARM ELEVATED THE REMAINDER OF THE DAY. Do not push, pull or lift anything that is heavier than 10 lb for 5 days. Ask your health care provider when it is okay to: Return to work or school. Resume usual physical activities or sports. Resume sexual activity. General instructions If the catheter site starts to  bleed, raise your arm and put firm pressure on the site. If the bleeding does not stop, get help right away. This is a medical emergency. DRINK PLENTY OF FLUIDS FOR THE NEXT 2-3 DAYS. No alcohol consumption for 24 hours after receiving sedation. If you went home on the same day as your procedure, a responsible adult should be with you for the first 24 hours after you arrive home. Keep all follow-up visits as told by your health care provider. This is important. Contact a health care provider if: You have a fever. You have redness, swelling, or yellow drainage around your insertion site. Get help right away if: You have unusual pain at the radial site. The catheter insertion area swells very fast. The insertion area is bleeding, and the bleeding does not stop when you hold steady pressure on the area. Your arm or hand becomes pale, cool, tingly, or numb. These symptoms may represent a serious problem that is an emergency. Do not wait to see if the symptoms will go away. Get medical help right away. Call your local emergency services (911 in the [place] Do not drive yourself to the hospital. Summary After the procedure, it is common to have bruising and tenderness at the site. Follow instructions from your health care provider about how to take care of your radial site wound. Check the wound every day for signs of infection.  This information is not intended to replace advice given to you by your health care provider. Make sure you discuss any questions you have with  your health care provider. Document Revised: 08/20/2017 Document Reviewed: 08/20/2017 Elsevier Patient Education  2020 [employer].

## 2024-04-01 NOTE — Interval H&P Note (Signed)
 History and Physical Interval Note:  04/01/2024 1:11 PM  Pamela Dominguez  has presented today for surgery, with the diagnosis of nstemi.  The various methods of treatment have been discussed with the patient and family. After consideration of risks, benefits and other options for treatment, the patient has consented to  Procedure(s): LEFT HEART CATH AND CORONARY ANGIOGRAPHY (N/A) as a surgical intervention.  The patient's history has been reviewed, patient examined, no change in status, stable for surgery.  I have reviewed the patient's chart and labs.  Questions were answered to the patient's satisfaction.    Cath Lab Visit (complete for each Cath Lab visit)  Clinical Evaluation Leading to the Procedure:   ACS: No.  Non-ACS:    Anginal Classification: CCS III  Anti-ischemic medical therapy: Minimal Therapy (1 class of medications)  Non-Invasive Test Results: No non-invasive testing performed  Prior CABG: No previous CABG        Lonni Cash

## 2024-04-01 NOTE — Discharge Summary (Signed)
 Discharge Summary   Patient ID: Pamela Dominguez MRN: [id number]; DOB: 1933/05/11  Admit date: 04/01/2024 Discharge date: 04/01/2024  PCP:  Thurmond Cathlyn LABOR., MD   [facility] Providers Cardiologist:  Redell Leiter, MD     Discharge Diagnoses  Principal Problem:   Chest pain Active Problems:   Essential hypertension   Hyperlipidemia LDL goal <70   Hypertension  Diagnostic Studies/Procedures   Cath: 04/01/2024    Dist LAD lesion is 50% stenosed.   Prox Cx lesion is 30% stenosed.   Previously placed Mid LAD stent of unknown type is  widely patent.   Patent mid LAD stent Moderate non-obstructive disease in the distal LAD Mild to moderate non-obstructive disease in the proximal Circumflex Dominant RCA with no obstructive disease.    Continue medical management of CAD. Consider long acting nitrates or other anti-anginal therapy.   Diagnostic Dominance: Right  _____________   History of Present Illness   Pamela Dominguez is a 89 y.o. female with CAD, hypertension, hyperlipidemia, and PACs/PVCs who was seen 04/01/2024 for the evaluation of chest pain.   Ms. Cheek has a history of CAD noted on cardiac CT 03/2018 with subsequent PCI-LAD 03/2018.  Nuclear stress test September 2020 with no ischemia.   Heart monitor 04/2023 with primarily normal sinus rhythm, short runs of SVT, PVCs.   Echocardiogram 12/2023 showed preserved LVEF 60 ICC 5%, normal RV, mildly elevated PASP, mild LAE, mild MR.    She presented to [facility] 03/30/2024 with chest pain concerning for angina.  She describes chest tightness that was worse with exercise.  She presented after experiencing chest pain around midnight that radiated to her left axilla and left neck.  She reported chest pain also woke her from sleep at 5 AM.  She reported the chest pain was similar to chest pain prior to her PCI.   Workup notable for troponin x 3 negative.  EKG did not appear ischemic although sinus bradycardia with heart rate  53.  While stress echocardiogram and nuclear stress test were discussed, given her known prior disease decision was made to transport to [facility] for definitive angiography.   On arrival to [facility], she reported stuttering chest pain all weekend, woke her from sleep on the night prior to presentation. She forgot to trial NTG. CP was similar in quality but less severe, rated as a 5/10. No associated symptoms but radiation to her left neck and left axilla.    Hospital Course    Chest pain CAD w/ prior DES to mLAD -- troponin x 3 negative, EKG with sinus bradycardia. Given symptoms concerning for unstable angina, she was transferred to [facility] for further evaluation with cardiac catheterization.  -- underwent cardiac cath 9/4 noted above with dLAD 50%, pLCx 30% with patent mLAD stent. Recommendations for continued medical therapy -- remains on plavix , 40 mg lipitor, 25 mg toprol    Hypertension -- 320 mg of valsartan , 25 mg spironolactone , Toprol  25 mg daily   PAC/PVCs -- continue 25 mg Toprol   Hyperlipidemia with LDL goal < 70 -- 05/05/2023: Cholesterol, Total 155; HDL 80; LDL Chol Calc (NIH) 60; Triglycerides 79 -- Continue statin  Did the patient have an acute coronary syndrome (MI, NSTEMI, STEMI, etc) this admission?:  No                               Did the patient have a percutaneous coronary intervention (stent / angioplasty)?:  No.   _____________  Discharge Vitals Blood pressure (!) 136/55, pulse (!) 51, resp. rate 16, height 5' 1.61 (1.565 m), weight 65 kg, SpO2 95%.  Filed Weights   04/01/24 1256  Weight: 65 kg    Labs & Radiologic Studies  CBC No results for input(s): WBC, NEUTROABS, HGB, HCT, MCV, PLT in the last 72 hours. Basic Metabolic Panel No results for input(s): NA, K, CL, CO2, GLUCOSE, BUN, CREATININE, CALCIUM , MG, PHOS in the last 72 hours. Liver Function Tests No results for input(s): AST, ALT, ALKPHOS, BILITOT,  PROT, ALBUMIN in the last 72 hours. No results for input(s): LIPASE, AMYLASE in the last 72 hours. High Sensitivity Troponin:   No results for input(s): TROPONINIHS in the last 720 hours.  No results for input(s): TRNPT in the last 720 hours.  BNP Invalid input(s): POCBNP No results for input(s): PROBNP in the last 72 hours.  No results for input(s): BNP in the last 72 hours.  D-Dimer No results for input(s): DDIMER in the last 72 hours. Hemoglobin A1C No results for input(s): HGBA1C in the last 72 hours. Fasting Lipid Panel No results for input(s): CHOL, HDL, LDLCALC, TRIG, CHOLHDL, LDLDIRECT in the last 72 hours. No results found for: LIPOA  Thyroid  Function Tests No results for input(s): TSH, T4TOTAL, T3FREE, THYROIDAB in the last 72 hours.  Invalid input(s): FREET3 _____________  CARDIAC CATHETERIZATION Result Date: 04/01/2024   Dist LAD lesion is 50% stenosed.   Prox Cx lesion is 30% stenosed.   Previously placed Mid LAD stent of unknown type is  widely patent. Patent mid LAD stent Moderate non-obstructive disease in the distal LAD Mild to moderate non-obstructive disease in the proximal Circumflex Dominant RCA with no obstructive disease. Continue medical management of CAD. Consider long acting nitrates or other anti-anginal therapy.    Disposition Pt is being discharged home today in good condition.  Follow-up Plans & Appointments    Discharge Medications Allergies as of 04/01/2024       Reactions   Penicillins Shortness Of Breath, Swelling   Has patient had a PCN reaction causing immediate rash, facial/tongue/throat swelling, SOB or lightheadedness with hypotension: Yes Has patient had a PCN reaction causing severe rash involving mucus membranes or skin necrosis: No Has patient had a PCN reaction that required hospitalization: No Has patient had a PCN reaction occurring within the last 10 years: No If all of the above answers  are NO, then may proceed with Cephalosporin use.   Actonel [risedronate Sodium] Other (See Comments)   felt like I was choking   Alendronate Nausea And Vomiting   Nizatidine Itching, Rash   Axid-Brand Name   Quinine Itching, Rash        Medication List     TAKE these medications    acetaminophen  650 MG CR tablet Commonly known as: TYLENOL  Take 650 mg by mouth in the morning.   atorvastatin  40 MG tablet Commonly known as: LIPITOR Take 1 tablet (40 mg total) by mouth daily. Take 1 tablet by mouth daily at 6 pm   cetirizine 10 MG tablet Commonly known as: ZYRTEC Take 10 mg by mouth every evening.   cholestyramine  4 g packet Commonly known as: QUESTRAN  Take 4 g by mouth at bedtime.   clopidogrel  75 MG tablet Commonly known as: PLAVIX  Take 1 tablet (75 mg total) by mouth daily with breakfast.   famotidine  40 MG tablet Commonly known as: PEPCID  Take 40 mg by mouth every evening.   Melatonin 5 MG  Caps Take 5 mg by mouth at bedtime as needed (sleep).   metoprolol  succinate 25 MG 24 hr tablet Commonly known as: Toprol  XL Take 1 tablet (25 mg total) by mouth daily. What changed: when to take this   multivitamin tablet Take 1 tablet by mouth daily.   nitroGLYCERIN  0.4 MG SL tablet Commonly known as: NITROSTAT  Place 1 tablet (0.4 mg total) under the tongue every 5 (five) minutes as needed for chest pain.   pantoprazole  40 MG tablet Commonly known as: PROTONIX  Take 1 tablet (40 mg total) by mouth daily.   spironolactone  25 MG tablet Commonly known as: ALDACTONE  Take 1 tablet (25 mg total) by mouth daily.   SYSTANE OP Place 1 drop into both eyes daily as needed (dry eyes).   traMADol  50 MG tablet Commonly known as: ULTRAM  Take 1-2 tablets (50-100 mg total) by mouth every 6 (six) hours as needed for moderate pain.   valsartan  320 MG tablet Commonly known as: DIOVAN  Take 1 tablet (320 mg total) by mouth daily.        Outstanding Labs/Studies  N/a    Duration of Discharge Encounter: APP Time: 15 minutes   Signed, Manuelita Rummer, NP 04/01/2024, 2:33 PM

## 2024-04-01 NOTE — Telephone Encounter (Signed)
 Pt c/o medication issue:  1. Name of Medication:   metoprolol  succinate (TOPROL -XL) 24 hr tablet 25 mg  nitroGLYCERIN  (NITROSTAT ) SL tablet 0.4 mg   2. How are you currently taking this medication (dosage and times per day)?   3. Are you having a reaction (difficulty breathing--STAT)?   4. What is your medication issue?   Daughter Giles) stated patient wants a call back directly to [phone] regarding her medication changes.

## 2024-04-02 ENCOUNTER — Other Ambulatory Visit: Payer: Self-pay

## 2024-04-02 ENCOUNTER — Encounter (HOSPITAL_COMMUNITY): Payer: Self-pay | Admitting: Cardiovascular Disease

## 2024-04-05 ENCOUNTER — Other Ambulatory Visit: Payer: Self-pay

## 2024-04-05 NOTE — Telephone Encounter (Signed)
 Called the patient and she stated that she was coming in tomorrow on 9/9 to have an appointment with Delon Hoover, NP to determinf if she should be Metoprolol  or Imdur . Patient stated that she already has Nitroglycerin  and does not need a re-fill at this time. Patient verbalized understanding and had no further questions at this time.

## 2024-04-06 ENCOUNTER — Ambulatory Visit: Attending: Cardiology | Admitting: Cardiology

## 2024-04-06 ENCOUNTER — Encounter: Payer: Self-pay | Admitting: Cardiology

## 2024-04-06 VITALS — BP 142/78 | HR 56 | Ht 60.0 in | Wt 144.4 lb

## 2024-04-06 DIAGNOSIS — G4733 Obstructive sleep apnea (adult) (pediatric): Secondary | ICD-10-CM | POA: Diagnosis not present

## 2024-04-06 DIAGNOSIS — E782 Mixed hyperlipidemia: Secondary | ICD-10-CM | POA: Diagnosis not present

## 2024-04-06 DIAGNOSIS — I10 Essential (primary) hypertension: Secondary | ICD-10-CM

## 2024-04-06 DIAGNOSIS — I2510 Atherosclerotic heart disease of native coronary artery without angina pectoris: Secondary | ICD-10-CM

## 2024-04-06 NOTE — Progress Notes (Signed)
 Cardiology Office Note   Date:  04/06/2024  ID:  Pamela Dominguez, Pamela Dominguez 02-23-1933, MRN [id number] PCP: Pamela Dominguez LABOR., MD  [facility] Providers Cardiologist:  Redell Leiter, MD     History of Present Illness Pamela Dominguez is a 89 y.o. female with a past medical history of CAD s/p DES to mid LAD, SVT, hypertension, PACs, GERD, IBS, dyslipidemia, OSA on CPAP.  04/01/2024 left heart cath previously placed stent widely patent, moderate nonobstructive disease in the distal LAD, mild to moderate nonobstructive disease in the proximal circumflex, consider long-acting nitrates  01/01/2024 echo EF 60 to 65%, mildly elevated PASP, LA mildly dilated, mild MR 05/05/2023 monitor average heart rate 75 bpm, predominantly sinus rhythm, no pauses, no episodes of atrial fibrillation, 7 runs of SVT, SVE were occasional at 5%, VE occasional at 1.2% 03/31/2019 Lexiscan  normal, low risk 04/08/2018 left heart cath DES to mid LAD 03/30/2018 coronary CTA calcium  score of 218, 60th percentile, severe stenosis in the mid LAD and FFR was positive  She is a longstanding patient of Dr. Leiter initially established with him in 2019 for episodes of chest pain, she underwent a coronary CTA revealing a calcium  score of 218, FFR was positive for the mid LAD.  She underwent a left heart cath in the same year with DES to her mid LAD.  In 2020 she had a Lexiscan  that was abnormal study.  In 2024 she wore a monitor revealing no episodes of atrial fibrillation, runs of SVT and PACs.  Most recently she presented to [facility] on 03/30/2024 with episodes of chest pain that were concerning for angina that apparently woke her up in the night, troponins were negative x 3, EKG without any acute changes, the decision was ultimately made to transfer to [facility] where she underwent a left heart cath on 04/01/2024 revealing previously placed stent was widely patent, mild to moderate nonobstructive disease in her proximal circumflex.  She  presents today for follow up accompanied by her daughter for follow up after her catheterization. She did have an episode of chest pain over the weekend while she was cleaning her kitchen, took 1 nitroglycerin  which relieved her symptoms. She is very physically active, independent, participates in water  aerobics multiple times/week. She denies palpitations, dyspnea, pnd, orthopnea, n, v, dizziness, syncope, edema, weight gain, or early satiety.   ROS: Review of Systems  Cardiovascular:  Positive for chest pain.  All other systems reviewed and are negative.    Studies Reviewed      Cardiac Studies & Procedures   ______________________________________________________________________________________________ CARDIAC CATHETERIZATION  CARDIAC CATHETERIZATION 04/01/2024  Conclusion   Dist LAD lesion is 50% stenosed.   Prox Cx lesion is 30% stenosed.   Previously placed Mid LAD stent of unknown type is  widely patent.  Patent mid LAD stent Moderate non-obstructive disease in the distal LAD Mild to moderate non-obstructive disease in the proximal Circumflex Dominant RCA with no obstructive disease.  Continue medical management of CAD. Consider long acting nitrates or other anti-anginal therapy.  Findings Coronary Findings Diagnostic  Dominance: Right  Left Anterior Descending Vessel is large. Previously placed Mid LAD stent of unknown type is  widely patent. Dist LAD lesion is 50% stenosed.  Left Circumflex Vessel is large. Prox Cx lesion is 30% stenosed.  Right Coronary Artery Vessel is large.  Intervention  No interventions have been documented.   CARDIAC CATHETERIZATION  CARDIAC CATHETERIZATION 04/08/2018  Conclusion Images from the original result were not included.  CULPRIT LESION: Mid LAD lesion is 80% stenosed.  A drug-eluting stent was successfully placed using a STENT SIERRA 2.25 X 15 MM. Postdilated to 2.8 mm.  Post intervention, there is a 0% residual  stenosis.  Otherwise essentially normal coronary arteries  The left ventricular systolic function is normal. LVEF 55-65% by visual estimate.  LV end diastolic pressure is normal.   Severe single-vessel disease involving the mid LAD beyond 2nd Diag = 80% lesion treated with Xience Sierra DES 2.25 mm x 15 mm postdilated to 2.8 mm)  Otherwise essentially normal coronaries with minimal disease.  Normal LV function with EDP  Plan: Unfortunately since we had to switch to femoral access, she will monitor tonight post PCI with sheath removal. Will likely need PRN's for hypertension. Restart post blood pressure medications with exception of beta-blocker tomorrow.  Anticipate discharge tomorrow.  Recommend uninterrupted dual antiplatelet therapy with Aspirin  81mg  daily and Clopidogrel  75mg  daily for a minimum of 6 months (stable ischemic heart disease - Class I recommendation).  Would be able to stop aspirin  after 3-6 months.  Without contraindication would recommend continuing clopidogrel  for 12 months, but after 6 months could hold for procedures.   Alm Clay, M.D., M.S. Interventional Cardiologist  Pager # [phone] Phone # [phone] [address] [place], [place] [address]  Findings Coronary Findings Diagnostic  Dominance: Right  Left Main Vessel is normal in caliber.  Left Anterior Descending Vessel is normal in caliber. Moderate to large caliber vessel that tapers distally after giving rise to the 2nd Diag branch. Mid LAD lesion is 80% stenosed. The lesion is distal to major branch, concentric and irregular.  First Diagonal Branch Vessel is small in size.  First Septal Branch Vessel is small in size.  Second Diagonal Branch Vessel is moderate in size.  Second Septal Branch Vessel is small in size.  Third Septal Branch Vessel is small in size.  Left Circumflex Vessel is normal in caliber. Vessel is angiographically normal.  First Obtuse  Marginal Branch Vessel is small in size. Vessel is angiographically normal.  Left Posterior Atrioventricular Artery Vessel is small in size.  Right Coronary Artery Vessel is small. Small to moderate caliber. Vessel is angiographically normal.  Right Posterior Descending Artery Vessel is small in size.  Right Posterior Atrioventricular Artery Vessel is moderate in size. Vessel is angiographically normal.  Intervention  Mid LAD lesion Stent Lesion length:  12 mm. CATHETER LAUNCHER 6FR JL4 guide catheter was inserted. Lesion crossed with guidewire using a WIRE ASAHI PROWATER 180CM. Pre-stent angioplasty was performed using a BALLOON SAPPHIRE 2.0X12. Maximum pressure:  10 atm. Inflation time:  20 sec. A drug-eluting stent was successfully placed using a STENT SIERRA 2.25 X 15 MM. Maximum pressure: 16 atm. Inflation time: 30 sec. Minimum lumen area:  2.8 mm. Stent strut is well apposed. Post-stent angioplasty was performed using a BALLOON SAPPHIRE [place] [id number]. Maximum pressure:  16 atm. Inflation time:  20 sec. Post-Intervention Lesion Assessment The intervention was successful. Pre-interventional TIMI flow is 3. Post-intervention TIMI flow is 3. Treated lesion length:  15 mm. No complications occurred at this lesion. There is a 0% residual stenosis post intervention.   STRESS TESTS  MYOCARDIAL PERFUSION IMAGING 03/31/2019  Interpretation Summary  The left ventricular ejection fraction is hyperdynamic (>65%).  Nuclear stress EF: 88%.  There was no ST segment deviation noted during stress.  The study is normal.  This is a low risk study.   ECHOCARDIOGRAM  ECHOCARDIOGRAM COMPLETE 01/01/2024  Narrative ECHOCARDIOGRAM  REPORT    Patient Name:   Pamela Dominguez Date of Exam: 01/01/2024 Medical Rec #:  [id number]    Height:       61.6 in Accession #:    [id number]   Weight:       142.8 lb Date of Birth:  1933/05/14    BSA:          1.649 m Patient Age:    89 years     BP:            126/60 mmHg Patient Gender: F            HR:           62 bpm. Exam Location:  [facility]  Procedure: 2D Echo, Cardiac Doppler, Color Doppler and Strain Analysis (Both Spectral and Color Flow Doppler were utilized during procedure).  Indications:    Shortness of breath [R06.02 (ICD-10-CM)]  History:        Patient has no prior history of Echocardiogram examinations. CAD, Arrythmias:PVC and PAC, Signs/Symptoms:Shortness of Breath; Risk Factors:Hypertension and Dyslipidemia.  Sonographer:    Charlie Jointer RDCS Referring Phys: [id number] ALEAN SAUNDERS MADIREDDY  IMPRESSIONS   1. Left ventricular ejection fraction, by estimation, is 60 to 65%. The left ventricle has normal function. The left ventricle has no regional wall motion abnormalities. Left ventricular diastolic parameters are indeterminate. The average left ventricular global longitudinal strain is -18.4 %. The global longitudinal strain is normal. 2. Right ventricular systolic function is normal. The right ventricular size is normal. There is mildly elevated pulmonary artery systolic pressure. 3. Left atrial size was mildly dilated. 4. The mitral valve is normal in structure. Mild mitral valve regurgitation. No evidence of mitral stenosis. 5. The aortic valve is normal in structure. Aortic valve regurgitation is not visualized. No aortic stenosis is present. 6. The inferior vena cava is normal in size with greater than 50% respiratory variability, suggesting right atrial pressure of 3 mmHg.  FINDINGS Left Ventricle: Left ventricular ejection fraction, by estimation, is 60 to 65%. The left ventricle has normal function. The left ventricle has no regional wall motion abnormalities. The average left ventricular global longitudinal strain is -18.4 %. Strain was performed and the global longitudinal strain is normal. The left ventricular internal cavity size was normal in size. There is no left ventricular hypertrophy. Left ventricular  diastolic parameters are indeterminate.  Right Ventricle: The right ventricular size is normal. No increase in right ventricular wall thickness. Right ventricular systolic function is normal. There is mildly elevated pulmonary artery systolic pressure. The tricuspid regurgitant velocity is 3.04 m/s, and with an assumed right atrial pressure of 3 mmHg, the estimated right ventricular systolic pressure is 40.0 mmHg.  Left Atrium: Left atrial size was mildly dilated.  Right Atrium: Right atrial size was normal in size.  Pericardium: There is no evidence of pericardial effusion.  Mitral Valve: The mitral valve is normal in structure. Mild mitral valve regurgitation. No evidence of mitral valve stenosis.  Tricuspid Valve: The tricuspid valve is normal in structure. Tricuspid valve regurgitation is not demonstrated. No evidence of tricuspid stenosis.  Aortic Valve: The aortic valve is normal in structure. Aortic valve regurgitation is not visualized. No aortic stenosis is present.  Pulmonic Valve: The pulmonic valve was normal in structure. Pulmonic valve regurgitation is not visualized. No evidence of pulmonic stenosis.  Aorta: The aortic root is normal in size and structure.  Venous: The inferior vena cava is normal in size with greater than 50%  respiratory variability, suggesting right atrial pressure of 3 mmHg.  IAS/Shunts: No atrial level shunt detected by color flow Doppler.   LEFT VENTRICLE PLAX 2D LVIDd:         3.90 cm   Diastology LVIDs:         2.30 cm   LV e' medial:    7.29 cm/s LV PW:         0.80 cm   LV E/e' medial:  13.7 LV IVS:        1.20 cm   LV e' lateral:   8.49 cm/s LVOT diam:     1.80 cm   LV E/e' lateral: 11.8 LV SV:         61 LV SV Index:   37        2D Longitudinal Strain LVOT Area:     2.54 cm  2D Strain GLS Avg:     -18.4 %   RIGHT VENTRICLE             IVC RV Basal diam:  3.20 cm     IVC diam: 1.60 cm RV Mid diam:    3.30 cm RV S prime:     14.05  cm/s TAPSE (M-mode): 2.6 cm  LEFT ATRIUM             Index        RIGHT ATRIUM           Index LA diam:        3.90 cm 2.36 cm/m   RA Area:     14.20 cm LA Vol (A2C):   59.3 ml 35.96 ml/m  RA Volume:   30.20 ml  18.31 ml/m LA Vol (A4C):   60.4 ml 36.63 ml/m LA Biplane Vol: 60.2 ml 36.51 ml/m AORTIC VALVE             PULMONIC VALVE LVOT Vmax:   100.10 cm/s PR End Diast Vel: 6.15 msec LVOT Vmean:  61.950 cm/s LVOT VTI:    0.240 m  AORTA Ao Root diam: 3.20 cm Ao Asc diam:  3.40 cm Ao Desc diam: 1.80 cm  MITRAL VALVE                TRICUSPID VALVE MV Area (PHT): 4.49 cm     TR Peak grad:   37.0 mmHg MV Decel Time: 169 msec     TR Vmax:        304.00 cm/s MV E velocity: 100.10 cm/s MV A velocity: 74.25 cm/s   SHUNTS MV E/A ratio:  1.35         Systemic VTI:  0.24 m Systemic Diam: 1.80 cm  Dominguez Tersigni Crape MD Electronically signed by Ireoluwa Grant Crape MD Signature Date/Time: 01/01/2024/2:52:51 PM    Final    MONITORS  LONG TERM MONITOR (3-14 DAYS) 05/22/2023  Narrative Patch Wear Time:  8 days and 8 hours (2024-10-09T12:01:54-0400 to 2024-10-17T20:10:20-0400)  Patient had a min HR of 47 bpm, max HR of 185 bpm, and avg HR of 75 bpm. Predominant underlying rhythm was Sinus Rhythm.  There were 2 triggered and 1 diary event all sinus rhythm at all associated with either ventricular or supraventricular premature beats.  There were no sinus pauses of 3 seconds or greater and no episodes of second or third-degree AV nodal block.  There were no episodes of atrial fibrillation or flutter.  1 run of PVCs occurred lasting 4 beats with a max rate of 185 bpm (avg 165 bpm).  7 Supraventricular  Tachycardia  runs occurred, the run with the fastest interval lasting 8 beats with a max rate of 138 bpm, the longest lasting 9 beats with an avg rate of 108 bpm.  Isolated SVEs were occasional (5.0%, 32993), SVE Couplets were rare (<1.0%, 377), and SVE Triplets were rare (<1.0%,  140).  Isolated VEs were occasional (1.2%, 7789), VE Couplets were rare (<1.0%, 23), and VE Triplets were rare (<1.0%, 3). Ventricular Bigeminy and Trigeminy were present.   CT SCANS  CT CORONARY MORPH W/CTA COR W/SCORE 03/27/2018  Addendum 03/28/2018  9:17 AM ADDENDUM REPORT: 03/28/2018 09:14  EXAM: OVER-READ INTERPRETATION CT CHEST  The following report is an over-read performed by radiologist Dr. Debby Satterfield of [facility] on 03/28/2018. This over-read does not include interpretation of cardiac or coronary anatomy or pathology. The coronary CTA interpretation by the cardiologist is attached.  COMPARISON:  CT chest 01/26/2018.  FINDINGS: Vascular: Mild atherosclerosis involving the descending thoracic aorta. No evidence of aortic aneurysm.  Mediastinum/Nodes: No pathologic lymphadenopathy within the visualized mediastinum. Visualized esophagus normal in appearance.  Lungs/Pleura: Scarring associated with dystrophic calcification involving the RIGHT MIDDLE LOBE, unchanged. Minimal scarring in the LEFT LOWER LOBE. Stable 4 mm subpleural nodule laterally in the LEFT LOWER LOBE (series 13, image 24). Visualized lung parenchyma otherwise clear. No pleural effusions. Visualized central airways patent without significant bronchial wall thickening.  Chest Wall: Normal.  Upper Abdomen: Small hiatal hernia containing fat. The stomach remains located below the diaphragm. Interposition of the hepatic flexure of the colon between the liver and the right hemidiaphragm. No significant abnormalities in the visualized upper abdomen allowing for the early arterial phase of enhancement.  Musculoskeletal: Visualized skeleton unremarkable.  IMPRESSION: 1.  Aortic Atherosclerosis, mild.  (ICD10-170.0) 2. No significant extracardiac abnormalities otherwise.   Electronically Signed By: Debby Satterfield M.D. On: 03/28/2018 09:14  Narrative CLINICAL DATA:  89 year old  female with typical chest pain.  EXAM: Cardiac/Coronary  CT  TECHNIQUE: The patient was scanned on a [employer].  FINDINGS: A 120 kV prospective scan was triggered in the descending thoracic aorta at 111 HU's. Axial non-contrast 3 mm slices were carried out through the heart. The data set was analyzed on a dedicated work station and scored using the Agatson method. Gantry rotation speed was 250 msecs and collimation was .6 mm. 10 mg of iv Metoprolol  and 0.8 mg of sl NTG was given. The 3D data set was reconstructed in 5% intervals of the 67-82 % of the R-R cycle. Diastolic phases were analyzed on a dedicated work station using MPR, MIP and VRT modes. The patient received 80 cc of contrast.  Aorta:  Normal size.  No calcifications.  No dissection.  Aortic Valve:  Trileaflet.  No calcifications.  Coronary Arteries:  Normal coronary origin.  Right dominance.  RCA is a large dominant artery that gives rise to PDA and PLVB. There is mild diffuse plaque with maximum stenosis 25-50%.  Left main is a very short artery that gives rise to LAD, ramus intermedius and has minimal plaque.  LAD is a large vessel that gives rise to two diagonal arteries. There is mild calcified plaque in the proximal portion with stenosis 25-50%, mid LAD has moderate non-calcified plaque with suspicion for a stenosis > 70%.  D1, D2 have minimal plaque.  LCX is a non-dominant artery that gives rise to one OM1 branch. There is mild plaque.  Other findings:  Normal pulmonary vein drainage into the left atrium.  Normal let atrial  appendage without a thrombus.  IMPRESSION: 1. Coronary calcium  score of 218. This was 60 percentile for age and sex matched control.  2. Normal coronary origin with right dominance.  3. Mild diffuse CAD and suspicion for a severe stenosis in the mid LAD. Additional analysis with CT FFR will be submitted.  Electronically Signed: By: Leim Moose On:  03/27/2018 18:34     ______________________________________________________________________________________________      Risk Assessment/Calculations   HYPERTENSION CONTROL Vitals:   04/06/24 0852 04/06/24 0958  BP: (!) 158/80 (!) 142/78    The patient's blood pressure is elevated above target today.  In order to address the patient's elevated BP: Blood pressure will be monitored at home to determine if medication changes need to be made.          Physical Exam VS:  BP (!) 142/78   Pulse (!) 56   Ht 5' (1.524 m)   Wt 144 lb 6.4 oz (65.5 kg)   SpO2 97%   BMI 28.20 kg/m        Wt Readings from Last 3 Encounters:  04/06/24 144 lb 6.4 oz (65.5 kg)  04/01/24 143 lb 4.8 oz (65 kg)  12/08/23 142 lb 12.8 oz (64.8 kg)    GEN: Well nourished, well developed in no acute distress NECK: No JVD; No carotid bruits CARDIAC: RRR, no murmurs, rubs, gallops RESPIRATORY:  Clear to auscultation without rales, wheezing or rhonchi  ABDOMEN: Soft, non-tender, non-distended EXTREMITIES:  No edema; No deformity  SKIN: right radial left heart cath site significant ecchymosis noted, no edema  ASSESSMENT AND PLAN CAD - s/p DES to mLAD >> catheterization last week revealed widely patent stent. One episodes of chest pain since then. Discussed long acting nitrate (was previously on Imdur  prior to stent in 2019), but she has only had a few episodes. If she continues to have episodes, we could try her on low dose Imdur  or amlodipine .  Continue Plavix  75 mg daily, nitroglycerin  as needed, Lipitor 40 mg daily, metoprolol  12.5 mg daily.  Hypertension - BP elevated today, will have her blood pressure log until she sees her PCP, it typically had been well-controlled.  For now we will continue valsartan  320 mg daily, metoprolol  12.5 mg daily, spironolactone  25 mg daily.  Preference would be to add amlodipine  if her blood pressure is still uncontrolled as this might also help with her episodes of chest  pain.  Dyslipidemia - LDL 46, continue Lipitor 40 mg daily.  OSA on CPAP - compliant.          Dispo: BP log until she follow up with PCP, if BP elevated consider starting amlodipine . Follow up in 6 months with Dr. Monetta.   Signed, Delon JAYSON Hoover, NP

## 2024-04-06 NOTE — Patient Instructions (Signed)

## 2024-04-23 ENCOUNTER — Ambulatory Visit: Admitting: Cardiology

## 2024-10-11 ENCOUNTER — Ambulatory Visit: Admitting: Cardiology
